# Patient Record
Sex: Male | Born: 1945 | Race: White | Hispanic: No | Marital: Married
Health system: Southern US, Community
[De-identification: ages and names within clinical notes are randomized; demographics above are authoritative.]

---

## 2011-05-30 DIAGNOSIS — N183 Chronic kidney disease, stage 3 unspecified: Secondary | ICD-10-CM | POA: Diagnosis not present

## 2011-05-30 DIAGNOSIS — I129 Hypertensive chronic kidney disease with stage 1 through stage 4 chronic kidney disease, or unspecified chronic kidney disease: Secondary | ICD-10-CM | POA: Diagnosis not present

## 2011-08-17 DIAGNOSIS — I2510 Atherosclerotic heart disease of native coronary artery without angina pectoris: Secondary | ICD-10-CM | POA: Diagnosis not present

## 2011-08-17 DIAGNOSIS — E7800 Pure hypercholesterolemia, unspecified: Secondary | ICD-10-CM | POA: Diagnosis not present

## 2011-08-17 DIAGNOSIS — E559 Vitamin D deficiency, unspecified: Secondary | ICD-10-CM | POA: Diagnosis not present

## 2011-08-17 DIAGNOSIS — Z79899 Other long term (current) drug therapy: Secondary | ICD-10-CM | POA: Diagnosis not present

## 2011-08-17 DIAGNOSIS — E785 Hyperlipidemia, unspecified: Secondary | ICD-10-CM | POA: Diagnosis not present

## 2011-08-17 DIAGNOSIS — I10 Essential (primary) hypertension: Secondary | ICD-10-CM | POA: Diagnosis not present

## 2011-10-01 DIAGNOSIS — I10 Essential (primary) hypertension: Secondary | ICD-10-CM | POA: Diagnosis not present

## 2011-10-01 DIAGNOSIS — E7800 Pure hypercholesterolemia, unspecified: Secondary | ICD-10-CM | POA: Diagnosis not present

## 2011-10-01 DIAGNOSIS — Z8673 Personal history of transient ischemic attack (TIA), and cerebral infarction without residual deficits: Secondary | ICD-10-CM | POA: Diagnosis not present

## 2011-10-01 DIAGNOSIS — R42 Dizziness and giddiness: Secondary | ICD-10-CM | POA: Diagnosis not present

## 2011-10-01 DIAGNOSIS — Z79899 Other long term (current) drug therapy: Secondary | ICD-10-CM | POA: Diagnosis not present

## 2011-10-01 DIAGNOSIS — Z7902 Long term (current) use of antithrombotics/antiplatelets: Secondary | ICD-10-CM | POA: Diagnosis not present

## 2011-10-01 DIAGNOSIS — H8309 Labyrinthitis, unspecified ear: Secondary | ICD-10-CM | POA: Diagnosis not present

## 2011-10-01 DIAGNOSIS — H832X9 Labyrinthine dysfunction, unspecified ear: Secondary | ICD-10-CM | POA: Diagnosis not present

## 2011-10-05 DIAGNOSIS — J9819 Other pulmonary collapse: Secondary | ICD-10-CM | POA: Diagnosis not present

## 2011-10-05 DIAGNOSIS — E876 Hypokalemia: Secondary | ICD-10-CM | POA: Diagnosis present

## 2011-10-05 DIAGNOSIS — I6789 Other cerebrovascular disease: Secondary | ICD-10-CM | POA: Diagnosis not present

## 2011-10-05 DIAGNOSIS — R5381 Other malaise: Secondary | ICD-10-CM | POA: Diagnosis not present

## 2011-10-05 DIAGNOSIS — R42 Dizziness and giddiness: Secondary | ICD-10-CM | POA: Diagnosis not present

## 2011-10-05 DIAGNOSIS — R9431 Abnormal electrocardiogram [ECG] [EKG]: Secondary | ICD-10-CM | POA: Diagnosis not present

## 2011-10-05 DIAGNOSIS — R471 Dysarthria and anarthria: Secondary | ICD-10-CM | POA: Diagnosis present

## 2011-10-05 DIAGNOSIS — R4701 Aphasia: Secondary | ICD-10-CM | POA: Diagnosis present

## 2011-10-05 DIAGNOSIS — R1310 Dysphagia, unspecified: Secondary | ICD-10-CM | POA: Diagnosis not present

## 2011-10-05 DIAGNOSIS — R0902 Hypoxemia: Secondary | ICD-10-CM | POA: Diagnosis not present

## 2011-10-05 DIAGNOSIS — J811 Chronic pulmonary edema: Secondary | ICD-10-CM | POA: Diagnosis not present

## 2011-10-05 DIAGNOSIS — R5383 Other fatigue: Secondary | ICD-10-CM | POA: Diagnosis not present

## 2011-10-05 DIAGNOSIS — D638 Anemia in other chronic diseases classified elsewhere: Secondary | ICD-10-CM | POA: Diagnosis present

## 2011-10-05 DIAGNOSIS — I10 Essential (primary) hypertension: Secondary | ICD-10-CM | POA: Diagnosis present

## 2011-10-05 DIAGNOSIS — G835 Locked-in state: Secondary | ICD-10-CM | POA: Diagnosis present

## 2011-10-05 DIAGNOSIS — J90 Pleural effusion, not elsewhere classified: Secondary | ICD-10-CM | POA: Diagnosis not present

## 2011-10-05 DIAGNOSIS — G319 Degenerative disease of nervous system, unspecified: Secondary | ICD-10-CM | POA: Diagnosis not present

## 2011-10-05 DIAGNOSIS — J189 Pneumonia, unspecified organism: Secondary | ICD-10-CM | POA: Diagnosis not present

## 2011-10-05 DIAGNOSIS — Z8673 Personal history of transient ischemic attack (TIA), and cerebral infarction without residual deficits: Secondary | ICD-10-CM | POA: Diagnosis not present

## 2011-10-05 DIAGNOSIS — Z4682 Encounter for fitting and adjustment of non-vascular catheter: Secondary | ICD-10-CM | POA: Diagnosis not present

## 2011-10-05 DIAGNOSIS — J9600 Acute respiratory failure, unspecified whether with hypoxia or hypercapnia: Secondary | ICD-10-CM | POA: Diagnosis not present

## 2011-10-05 DIAGNOSIS — R0602 Shortness of breath: Secondary | ICD-10-CM | POA: Diagnosis not present

## 2011-10-05 DIAGNOSIS — J690 Pneumonitis due to inhalation of food and vomit: Secondary | ICD-10-CM | POA: Diagnosis not present

## 2011-10-05 DIAGNOSIS — E440 Moderate protein-calorie malnutrition: Secondary | ICD-10-CM | POA: Diagnosis not present

## 2011-10-05 DIAGNOSIS — E7800 Pure hypercholesterolemia, unspecified: Secondary | ICD-10-CM | POA: Diagnosis present

## 2011-10-05 DIAGNOSIS — N179 Acute kidney failure, unspecified: Secondary | ICD-10-CM | POA: Diagnosis present

## 2011-10-05 DIAGNOSIS — I6350 Cerebral infarction due to unspecified occlusion or stenosis of unspecified cerebral artery: Secondary | ICD-10-CM | POA: Diagnosis not present

## 2011-10-05 DIAGNOSIS — R918 Other nonspecific abnormal finding of lung field: Secondary | ICD-10-CM | POA: Diagnosis not present

## 2011-10-05 DIAGNOSIS — A419 Sepsis, unspecified organism: Secondary | ICD-10-CM | POA: Diagnosis not present

## 2011-10-05 DIAGNOSIS — Z452 Encounter for adjustment and management of vascular access device: Secondary | ICD-10-CM | POA: Diagnosis not present

## 2011-10-21 ENCOUNTER — Other Ambulatory Visit (HOSPITAL_COMMUNITY): Payer: Self-pay

## 2011-10-21 ENCOUNTER — Inpatient Hospital Stay
Admission: AD | Admit: 2011-10-21 | Discharge: 2011-11-18 | Disposition: A | Discharge status: Skilled Nursing Facility | Payer: Self-pay | Source: Ambulatory Visit | Attending: Internal Medicine | Admitting: Internal Medicine

## 2011-10-21 DIAGNOSIS — N390 Urinary tract infection, site not specified: Secondary | ICD-10-CM | POA: Diagnosis not present

## 2011-10-21 DIAGNOSIS — Z9911 Dependence on respirator [ventilator] status: Secondary | ICD-10-CM | POA: Diagnosis not present

## 2011-10-21 DIAGNOSIS — J690 Pneumonitis due to inhalation of food and vomit: Secondary | ICD-10-CM | POA: Diagnosis present

## 2011-10-21 DIAGNOSIS — J9610 Chronic respiratory failure, unspecified whether with hypoxia or hypercapnia: Secondary | ICD-10-CM | POA: Diagnosis not present

## 2011-10-21 DIAGNOSIS — J9600 Acute respiratory failure, unspecified whether with hypoxia or hypercapnia: Secondary | ICD-10-CM | POA: Diagnosis not present

## 2011-10-21 DIAGNOSIS — R5381 Other malaise: Secondary | ICD-10-CM | POA: Diagnosis present

## 2011-10-21 DIAGNOSIS — R05 Cough: Secondary | ICD-10-CM | POA: Diagnosis not present

## 2011-10-21 DIAGNOSIS — R059 Cough, unspecified: Secondary | ICD-10-CM | POA: Diagnosis not present

## 2011-10-21 DIAGNOSIS — E785 Hyperlipidemia, unspecified: Secondary | ICD-10-CM | POA: Diagnosis present

## 2011-10-21 DIAGNOSIS — F329 Major depressive disorder, single episode, unspecified: Secondary | ICD-10-CM | POA: Diagnosis present

## 2011-10-21 DIAGNOSIS — J9620 Acute and chronic respiratory failure, unspecified whether with hypoxia or hypercapnia: Secondary | ICD-10-CM | POA: Diagnosis not present

## 2011-10-21 DIAGNOSIS — D649 Anemia, unspecified: Secondary | ICD-10-CM | POA: Diagnosis present

## 2011-10-21 DIAGNOSIS — B9689 Other specified bacterial agents as the cause of diseases classified elsewhere: Secondary | ICD-10-CM | POA: Diagnosis present

## 2011-10-21 DIAGNOSIS — Z930 Tracheostomy status: Secondary | ICD-10-CM | POA: Diagnosis not present

## 2011-10-21 DIAGNOSIS — I69969 Other paralytic syndrome following unspecified cerebrovascular disease affecting unspecified side: Secondary | ICD-10-CM | POA: Diagnosis not present

## 2011-10-21 DIAGNOSIS — G894 Chronic pain syndrome: Secondary | ICD-10-CM | POA: Diagnosis present

## 2011-10-21 DIAGNOSIS — I10 Essential (primary) hypertension: Secondary | ICD-10-CM | POA: Diagnosis present

## 2011-10-21 DIAGNOSIS — J9819 Other pulmonary collapse: Secondary | ICD-10-CM | POA: Diagnosis not present

## 2011-10-21 DIAGNOSIS — R0602 Shortness of breath: Secondary | ICD-10-CM | POA: Diagnosis not present

## 2011-10-21 DIAGNOSIS — E876 Hypokalemia: Secondary | ICD-10-CM | POA: Diagnosis not present

## 2011-10-21 DIAGNOSIS — G835 Locked-in state: Secondary | ICD-10-CM | POA: Diagnosis present

## 2011-10-21 DIAGNOSIS — E46 Unspecified protein-calorie malnutrition: Secondary | ICD-10-CM | POA: Diagnosis present

## 2011-10-21 DIAGNOSIS — R1310 Dysphagia, unspecified: Secondary | ICD-10-CM | POA: Diagnosis present

## 2011-10-21 DIAGNOSIS — B370 Candidal stomatitis: Secondary | ICD-10-CM | POA: Diagnosis present

## 2011-10-21 DIAGNOSIS — J90 Pleural effusion, not elsewhere classified: Secondary | ICD-10-CM | POA: Diagnosis not present

## 2011-10-21 DIAGNOSIS — M6281 Muscle weakness (generalized): Secondary | ICD-10-CM | POA: Diagnosis not present

## 2011-10-21 DIAGNOSIS — R918 Other nonspecific abnormal finding of lung field: Secondary | ICD-10-CM | POA: Diagnosis not present

## 2011-10-21 DIAGNOSIS — J95851 Ventilator associated pneumonia: Secondary | ICD-10-CM | POA: Diagnosis present

## 2011-10-21 DIAGNOSIS — I6350 Cerebral infarction due to unspecified occlusion or stenosis of unspecified cerebral artery: Secondary | ICD-10-CM | POA: Diagnosis not present

## 2011-10-21 DIAGNOSIS — R079 Chest pain, unspecified: Secondary | ICD-10-CM | POA: Diagnosis not present

## 2011-10-21 DIAGNOSIS — R0989 Other specified symptoms and signs involving the circulatory and respiratory systems: Secondary | ICD-10-CM | POA: Diagnosis not present

## 2011-10-21 DIAGNOSIS — G8250 Quadriplegia, unspecified: Secondary | ICD-10-CM | POA: Diagnosis not present

## 2011-10-21 DIAGNOSIS — J811 Chronic pulmonary edema: Secondary | ICD-10-CM | POA: Diagnosis not present

## 2011-10-21 DIAGNOSIS — G9340 Encephalopathy, unspecified: Secondary | ICD-10-CM | POA: Diagnosis present

## 2011-10-21 DIAGNOSIS — J159 Unspecified bacterial pneumonia: Secondary | ICD-10-CM | POA: Diagnosis not present

## 2011-10-21 DIAGNOSIS — R1312 Dysphagia, oropharyngeal phase: Secondary | ICD-10-CM | POA: Diagnosis not present

## 2011-10-21 DIAGNOSIS — R42 Dizziness and giddiness: Secondary | ICD-10-CM | POA: Diagnosis not present

## 2011-10-21 DIAGNOSIS — Z431 Encounter for attention to gastrostomy: Secondary | ICD-10-CM | POA: Diagnosis not present

## 2011-10-21 DIAGNOSIS — J984 Other disorders of lung: Secondary | ICD-10-CM | POA: Diagnosis not present

## 2011-10-21 DIAGNOSIS — I69922 Dysarthria following unspecified cerebrovascular disease: Secondary | ICD-10-CM | POA: Diagnosis not present

## 2011-10-21 DIAGNOSIS — R000 Tachycardia, unspecified: Secondary | ICD-10-CM | POA: Diagnosis not present

## 2011-10-21 DIAGNOSIS — M6250 Muscle wasting and atrophy, not elsewhere classified, unspecified site: Secondary | ICD-10-CM | POA: Diagnosis not present

## 2011-10-22 ENCOUNTER — Other Ambulatory Visit (HOSPITAL_COMMUNITY): Payer: Self-pay

## 2011-10-22 DIAGNOSIS — J9620 Acute and chronic respiratory failure, unspecified whether with hypoxia or hypercapnia: Secondary | ICD-10-CM | POA: Diagnosis present

## 2011-10-22 DIAGNOSIS — Z9911 Dependence on respirator [ventilator] status: Secondary | ICD-10-CM | POA: Diagnosis not present

## 2011-10-22 DIAGNOSIS — J9600 Acute respiratory failure, unspecified whether with hypoxia or hypercapnia: Secondary | ICD-10-CM | POA: Diagnosis not present

## 2011-10-22 DIAGNOSIS — R918 Other nonspecific abnormal finding of lung field: Secondary | ICD-10-CM | POA: Diagnosis not present

## 2011-10-22 DIAGNOSIS — Z930 Tracheostomy status: Secondary | ICD-10-CM

## 2011-10-22 LAB — CBC WITH DIFFERENTIAL/PLATELET
Basophils Absolute: 0.1 10*3/uL (ref 0.0–0.1)
Basophils Relative: 1 % (ref 0–1)
Eosinophils Absolute: 0.4 10*3/uL (ref 0.0–0.7)
Eosinophils Relative: 4 % (ref 0–5)
HCT: 30.7 % — ABNORMAL LOW (ref 39.0–52.0)
Hemoglobin: 10.1 g/dL — ABNORMAL LOW (ref 13.0–17.0)
Lymphocytes Relative: 10 % — ABNORMAL LOW (ref 12–46)
Lymphs Abs: 1.1 10*3/uL (ref 0.7–4.0)
MCH: 30.9 pg (ref 26.0–34.0)
MCHC: 32.9 g/dL (ref 30.0–36.0)
MCV: 93.9 fL (ref 78.0–100.0)
Monocytes Absolute: 0.7 10*3/uL (ref 0.1–1.0)
Monocytes Relative: 6 % (ref 3–12)
Neutro Abs: 8.9 10*3/uL — ABNORMAL HIGH (ref 1.7–7.7)
Neutrophils Relative %: 80 % — ABNORMAL HIGH (ref 43–77)
Platelets: 511 10*3/uL — ABNORMAL HIGH (ref 150–400)
RBC: 3.27 MIL/uL — ABNORMAL LOW (ref 4.22–5.81)
RDW: 13.5 % (ref 11.5–15.5)
WBC: 11.2 10*3/uL — ABNORMAL HIGH (ref 4.0–10.5)

## 2011-10-22 LAB — PHOSPHORUS: Phosphorus: 2.2 mg/dL — ABNORMAL LOW (ref 2.3–4.6)

## 2011-10-22 LAB — VANCOMYCIN, TROUGH: Vancomycin Tr: 9.6 ug/mL — ABNORMAL LOW (ref 10.0–20.0)

## 2011-10-22 LAB — COMPREHENSIVE METABOLIC PANEL
ALT: 20 U/L (ref 0–53)
AST: 14 U/L (ref 0–37)
Albumin: 2.4 g/dL — ABNORMAL LOW (ref 3.5–5.2)
Alkaline Phosphatase: 70 U/L (ref 39–117)
BUN: 22 mg/dL (ref 6–23)
CO2: 25 mEq/L (ref 19–32)
Calcium: 8.7 mg/dL (ref 8.4–10.5)
Chloride: 107 mEq/L (ref 96–112)
Creatinine, Ser: 0.9 mg/dL (ref 0.50–1.35)
GFR calc Af Amer: >90 mL/min (ref >90)
GFR calc non Af Amer: 87 mL/min — ABNORMAL LOW (ref >90)
Glucose, Bld: 161 mg/dL — ABNORMAL HIGH (ref 70–99)
Potassium: 3.3 mEq/L — ABNORMAL LOW (ref 3.5–5.1)
Sodium: 142 mEq/L (ref 135–145)
Total Bilirubin: 0.5 mg/dL (ref 0.3–1.2)
Total Protein: 6.2 g/dL (ref 6.0–8.3)

## 2011-10-22 LAB — IRON AND TIBC
Iron: 18 ug/dL — ABNORMAL LOW (ref 42–135)
Saturation Ratios: 9 % — ABNORMAL LOW (ref 20–55)
TIBC: 194 ug/dL — ABNORMAL LOW (ref 215–435)
UIBC: 176 ug/dL (ref 125–400)

## 2011-10-22 LAB — MAGNESIUM: Magnesium: 1.8 mg/dL (ref 1.5–2.5)

## 2011-10-22 LAB — VITAMIN B12: Vitamin B-12: 372 pg/mL (ref 211–911)

## 2011-10-22 LAB — TSH: TSH: 0.737 u[IU]/mL (ref 0.350–4.500)

## 2011-10-22 LAB — PROCALCITONIN: Procalcitonin: <0.1 ng/mL

## 2011-10-22 NOTE — Consult Note (Signed)
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 [facility] 10/22/11 for help with acute on chronic respiratory failure and chronic critical illness  PMH: 1. Stroke 2. HTN 3. ARF resolved 4. Aspiration pna 5.  meds reviewed Allergies none  Family History reviewed Social History Former smoker Review Of Systems:  na  Vital Signs: Vital signs reviewed. Abnormal values will appear under impression plan section.    Physical Examination: General:  WNWDWM Neuro:  Global weakness with L.R HEENT:  No adenopathy Neck:  Trach->vent Cardiovascular:  hsr rrr Lungs:  ciarse rhonchi Abdomen:  +bs and tube feeds via peg Musculoskeletal:  weak Skin:  intact   ASSESSMENT AND PLAN  PULMONARY No results found for this basename: PHART:5,PCO2:5,PCO2ART:5,PO2ART:5,HCO3:5,O2SAT:5 in the last 168 hours Ventilator Settings:   CXR:   Dg Chest Port 1 View  10/22/2011  *RADIOLOGY REPORT*  Clinical Data: VDRF  PORTABLE CHEST - 1 VIEW  Comparison: None.  Findings: Mild bilateral lower lobe opacities, possibly atelectasis.  Chronic interstitial markings/emphysematous changes. No pleural effusion or pneumothorax.  Mild cardiomegaly.  Tracheostomy.  Left IJ venous catheter with its tip just below the cavoatrial junction.  IMPRESSION: Mild bilateral lower lobe opacities, possibly atelectasis.  Original Report Authenticated By: Charline Bills, M.D.   Dg Abd Portable 1v  10/21/2011  *RADIOLOGY REPORT*  Clinical Data: Peg tube placement  PORTABLE ABDOMEN - 1 VIEW  Comparison:  Portable exam 2035 hours without priors for comparison  Findings: . Gastrostomy tube projects over the expected position of the mid stomach. Normal bowel gas pattern. Osseous structures unremarkable. Vascular calcifications in pelvis. No urinary tract calcifications.  IMPRESSION: Gastrostomy tube projects over the expected position of the mid stomach.  Original Report Authenticated By: Lollie Marrow, M.D.    Lab 10/22/11 0513  NA 142  K 3.3*  CL 107  CO2 25  BUN 22  CREATININE 0.90  GLUCOSE 161*    Lab 10/22/11 0513  HGB 10.1*  HCT 30.7*  WBC 11.2*  PLT 511*    ABG   trach:  09/28/24  A:  Acute on Chronic Respiratory Failure that is multifactorial with aspiration pna, medullary stroke and decondtioning. P:   -6/29 start [facility] wean protocol -abx per ssh hospitalist -stroke per [facility] Minor ACNP Adolph Pollack PCCM Pager [phone] till 3 pm If no answer page [phone] 10/22/2011, 10:58 AM    STAFF NOTE: I, Dr Lavinia Sharps have personally reviewed patient's available data, including medical history, events of note, physical examination and test results as part of my evaluation. I have discussed with resident/NP and other care providers such as pharmacist, RN and RRT.  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 I updated son about general nature of weaning process and chronic critical illness and uncertainties. He is appreciative of care. Will start SBT protocol   Rest per NP/medical resident whose note is outlined above and that I agree with  Dr. Kalman Shan, M.D., [facility].C.P Pulmonary and Critical Care Medicine Staff Physician [facility] [place] [facility] Critical [facility] Pager: [phone], If no answer or between  15:00h - 7:00h: call [phone]  10/22/2011 1:18 PM

## 2011-10-23 LAB — POTASSIUM: Potassium: 3.9 mEq/L (ref 3.5–5.1)

## 2011-10-23 LAB — HEMOGLOBIN A1C
Hgb A1c MFr Bld: 6.5 % — ABNORMAL HIGH (ref <5.7)
Mean Plasma Glucose: 140 mg/dL — ABNORMAL HIGH (ref <117)

## 2011-10-24 ENCOUNTER — Other Ambulatory Visit (HOSPITAL_COMMUNITY): Payer: Self-pay

## 2011-10-24 DIAGNOSIS — Z930 Tracheostomy status: Secondary | ICD-10-CM

## 2011-10-24 DIAGNOSIS — J9620 Acute and chronic respiratory failure, unspecified whether with hypoxia or hypercapnia: Secondary | ICD-10-CM

## 2011-10-24 DIAGNOSIS — J9819 Other pulmonary collapse: Secondary | ICD-10-CM | POA: Diagnosis not present

## 2011-10-24 DIAGNOSIS — R0602 Shortness of breath: Secondary | ICD-10-CM | POA: Diagnosis not present

## 2011-10-24 DIAGNOSIS — I6350 Cerebral infarction due to unspecified occlusion or stenosis of unspecified cerebral artery: Secondary | ICD-10-CM | POA: Diagnosis not present

## 2011-10-24 LAB — PREALBUMIN: Prealbumin: 16.4 mg/dL — ABNORMAL LOW (ref 17.0–34.0)

## 2011-10-24 LAB — CBC
HCT: 31.9 % — ABNORMAL LOW (ref 39.0–52.0)
Hemoglobin: 10.6 g/dL — ABNORMAL LOW (ref 13.0–17.0)
MCH: 31.1 pg (ref 26.0–34.0)
MCHC: 33.2 g/dL (ref 30.0–36.0)
MCV: 93.5 fL (ref 78.0–100.0)
Platelets: 536 10*3/uL — ABNORMAL HIGH (ref 150–400)
RBC: 3.41 MIL/uL — ABNORMAL LOW (ref 4.22–5.81)
RDW: 13.4 % (ref 11.5–15.5)
WBC: 9.3 10*3/uL (ref 4.0–10.5)

## 2011-10-24 LAB — CULTURE, RESPIRATORY W GRAM STAIN

## 2011-10-24 LAB — BASIC METABOLIC PANEL
BUN: 19 mg/dL (ref 6–23)
CO2: 26 mEq/L (ref 19–32)
Calcium: 9.3 mg/dL (ref 8.4–10.5)
Chloride: 102 mEq/L (ref 96–112)
Creatinine, Ser: 0.88 mg/dL (ref 0.50–1.35)
GFR calc Af Amer: >90 mL/min (ref >90)
GFR calc non Af Amer: 88 mL/min — ABNORMAL LOW (ref >90)
Glucose, Bld: 147 mg/dL — ABNORMAL HIGH (ref 70–99)
Potassium: 3.9 mEq/L (ref 3.5–5.1)
Sodium: 137 mEq/L (ref 135–145)

## 2011-10-24 LAB — FOLATE RBC: RBC Folate: 1030 ng/mL — ABNORMAL HIGH (ref >=366)

## 2011-10-24 LAB — GRAM STAIN

## 2011-10-24 LAB — VANCOMYCIN, TROUGH: Vancomycin Tr: 13.3 ug/mL (ref 10.0–20.0)

## 2011-10-24 LAB — MAGNESIUM: Magnesium: 1.8 mg/dL (ref 1.5–2.5)

## 2011-10-24 LAB — C-REACTIVE PROTEIN: CRP: 9.95 mg/dL — ABNORMAL HIGH (ref <0.60)

## 2011-10-24 NOTE — Progress Notes (Signed)
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 [facility] 10/22/11 for help with acute on chronic respiratory failure and chronic critical illness  EVENTS   SUBJECTIVE/OVERNIGHT/INTERVAL HX  - Some power on all extremities. Follows commands. Oriented.   - Unable to wwean; gets very tachypneic.  - Intermittent bloody secretions via trach  Vital Signs: T 98.8 P 88 RR 16 BP 188/98.    Physical Examination: General:  WNWDWM Neuro:  some power on all extremities (1-2/5). Follows commands. Oriented.  HEENT:  No adenopathy Neck:  Trach->vent Cardiovascular:  hsr rrr Lungs:  ciarse rhonchi Abdomen:  +bs and tube feeds via peg Musculoskeletal:  weak Skin:  intact   Dg Chest Port 1 View  10/24/2011  *RADIOLOGY REPORT*  Clinical Data:  Tracheostomy, stroke, shortness of breath, aspiration  PORTABLE CHEST - 1 VIEW  Comparison: Portable exam 0846 hours compared to 10/22/2011  Findings: Tracheostomy tube stable. Tip of left subclavian line projects over high right atrium though this could be accentuated by positioning. Enlargement of cardiac silhouette. Tortuous aorta. Bibasilar atelectasis. Upper lungs clear. No pleural effusion or pneumothorax.  IMPRESSION: Bibasilar atelectasis.  Original Report Authenticated By: Lollie Marrow, M.D.     Lab 10/24/11 0400 10/22/11 0513  HGB 10.6* 10.1*  HCT 31.9* 30.7*  WBC 9.3 11.2*  PLT 536* 511*    Lab 10/24/11 0400 10/23/11 0448 10/22/11 0513  NA 137 -- 142  K 3.9 3.9 --  CL 102  -- 107  CO2 26 -- 25  GLUCOSE 147* -- 161*  BUN 19 -- 22  CREATININE 0.88 -- 0.90  CALCIUM 9.3 -- 8.7  MG 1.8 -- 1.8  PHOS -- -- 2.2*    No results found for this basename: PROBNP:5 in the last 168 hours  No results found for this basename: TROPONINI:5 in the last 168 hours   ASSESSMENT AND PLAN  PULMONARY    A:  Acute on Chronic Respiratory Failure that is multifactorial with aspiration pna, medullary stroke and decondtioning. S/p Trach at [facility] 10/19/11  - on 10/24/11: Fails SBT with tachypnea; sugestive of neuromuscular weakness esp in setting of clear lung fields. Has interittent bloody secretions  P:   -SBT as tolerated - check tracheal aspirate for culture and check PCT (afebrile though)  - check bnp ( noted he is on coreg, ace inhibitor, plavix), and depending on result decide on echo     Dr. Kalman Shan, M.D., [facility].C.P Pulmonary and Critical Care Medicine Staff Physician [facility] [place] Pulmonary and Critical Care Pager: [phone], If no answer or between  15:00h - 7:00h: call [phone]  10/24/2011 11:34 AM

## 2011-10-25 LAB — PRO B NATRIURETIC PEPTIDE: Pro B Natriuretic peptide (BNP): 324.7 pg/mL — ABNORMAL HIGH (ref 0–125)

## 2011-10-25 LAB — BASIC METABOLIC PANEL
BUN: 21 mg/dL (ref 6–23)
CO2: 27 mEq/L (ref 19–32)
Calcium: 9.4 mg/dL (ref 8.4–10.5)
Chloride: 100 mEq/L (ref 96–112)
Creatinine, Ser: 0.97 mg/dL (ref 0.50–1.35)
GFR calc Af Amer: >90 mL/min (ref >90)
GFR calc non Af Amer: 84 mL/min — ABNORMAL LOW (ref >90)
Glucose, Bld: 139 mg/dL — ABNORMAL HIGH (ref 70–99)
Potassium: 3.7 mEq/L (ref 3.5–5.1)
Sodium: 135 mEq/L (ref 135–145)

## 2011-10-25 LAB — PROCALCITONIN: Procalcitonin: <0.1 ng/mL

## 2011-10-26 NOTE — Progress Notes (Signed)
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 [facility]CC[facility] 10/22/11 for help with acute on chronic respiratory failure and chronic critical illness  EVENTS 10/24/11 - not weaning  SUBJECTIVE/OVERNIGHT/INTERVAL HX 7/3: weaned 8h yesterdady on PSV.  Some power on all extremities. Follows commands. Oriented.  No secretions  -   Vital Signs: T 99.6 P 84 RR 16 BP 160/85.    Physical Examination: General:  Chronic critically lill. Looks better than 10/24/11 Neuro:  some power on all extremities (1-2/5). Follows commands. Oriented.  HEENT:  No adenopathy Neck:  Trach->vent Cardiovascular:  hsr rrr Lungs:  ciarse rhonchi Abdomen:  +bs and tube feeds via peg Musculoskeletal:  weak Skin:  intact   No results found.   Lab 10/24/11 0400 10/22/11 0513  HGB 10.6* 10.1*  HCT 31.9* 30.7*  WBC 9.3 11.2*  PLT 536* 511*    Lab 10/25/11 0820 10/24/11 0400 10/22/11 0513  NA 135 137 142  K 3.7 3.9 --  CL 100 102 107  CO2 27 26 25   GLUCOSE 139* 147* 161*  BUN 21 19 22   CREATININE 0.97 0.88 0.90  CALCIUM 9.4 9.3 8.7  MG -- 1.8 1.8  PHOS -- -- 2.2*     Lab 10/25/11 0440  PROBNP 324.7*    No results found for this basename: TROPONINI:5 in the last 168 hours   Lab 10/25/11 0440 10/22/11 0513  PROCALCITON <0.10 <0.10    Results for orders placed during the hospital encounter of 10/21/11  CULTURE, RESPIRATORY     Status: Normal   Collection Time   10/22/11  5:00 AM      Component Value Range Status Comment   Specimen Description TRACHEAL ASPIRATE   Final    Special Requests NONE   Final    Gram Stain     Final    Value: FEW WBC PRESENT, PREDOMINANTLY PMN     NO SQUAMOUS EPITHELIAL CELLS SEEN     FEW GRAM NEGATIVE RODS   Culture FEW STENOTROPHOMONAS MALTOPHILIA   Final    Report Status 10/24/2011 FINAL   Final    Organism ID, Bacteria STENOTROPHOMONAS MALTOPHILIA   Final   GRAM STAIN     Status: Normal   Collection Time   10/24/11  6:46 PM      Component Value Range Status Comment   Specimen Description TRACHEAL ASPIRATE   Final    Special Requests NONE   Final    Gram Stain     Final    Value: ABUNDANT WBC PRESENT,BOTH PMN AND MONONUCLEAR     RARE GRAM POSITIVE COCCI     RARE GRAM NEGATIVE RODS   Report Status 10/24/2011 FINAL   Final   CULTURE, RESPIRATORY     Status: Normal (Preliminary result)   Collection Time   10/24/11  6:47 PM      Component Value Range Status Comment   Specimen Description TRACHEAL ASPIRATE   Final    Special Requests NONE   Final  Gram Stain PENDING   Incomplete    Culture MODERATE GRAM NEGATIVE RODS   Final    Report Status PENDING   Incomplete      ASSESSMENT AND PLAN  PULMONARY    A:  Acute on Chronic Respiratory Failure that is multifactorial with aspiration pna, medullary stroke and decondtioning. S/p Trach at [facility] 10/19/11  - on 10/26/11: doing sbt on 10/25/11 for 8h. BNP 300s. PCT normal and no evidence of sepsis but does have stenotrophomonas in sputum  P:   -SBT as tolerated - abx per [facility] MD; change vanxc and zosyn to levaquin 5 day course from 10/26/2011  - get echo   D/w Dr Christella Hartigan   Dr. Kalman Shan, M.D., [facility].C.P Pulmonary and Critical Care Medicine Staff Physician [facility] [place] Pulmonary and Critical Care Pager: [phone], If no answer or between  15:00h - 7:00h: call [phone]  10/26/2011 12:00 PM

## 2011-10-28 LAB — CBC
HCT: 35.5 % — ABNORMAL LOW (ref 39.0–52.0)
Hemoglobin: 11.4 g/dL — ABNORMAL LOW (ref 13.0–17.0)
MCH: 29.5 pg (ref 26.0–34.0)
MCHC: 32.1 g/dL (ref 30.0–36.0)
MCV: 92 fL (ref 78.0–100.0)
Platelets: 523 10*3/uL — ABNORMAL HIGH (ref 150–400)
RBC: 3.86 MIL/uL — ABNORMAL LOW (ref 4.22–5.81)
RDW: 12.8 % (ref 11.5–15.5)
WBC: 9.3 10*3/uL (ref 4.0–10.5)

## 2011-10-28 LAB — BASIC METABOLIC PANEL
BUN: 28 mg/dL — ABNORMAL HIGH (ref 6–23)
CO2: 26 mEq/L (ref 19–32)
Calcium: 9.9 mg/dL (ref 8.4–10.5)
Chloride: 98 mEq/L (ref 96–112)
Creatinine, Ser: 0.95 mg/dL (ref 0.50–1.35)
GFR calc Af Amer: >90 mL/min (ref >90)
GFR calc non Af Amer: 85 mL/min — ABNORMAL LOW (ref >90)
Glucose, Bld: 136 mg/dL — ABNORMAL HIGH (ref 70–99)
Potassium: 3.5 mEq/L (ref 3.5–5.1)
Sodium: 137 mEq/L (ref 135–145)

## 2011-10-28 NOTE — Progress Notes (Signed)
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 [facility] 10/22/11 for help with acute on chronic respiratory failure and chronic critical illness  EVENTS 7/1 - not weaning 7/3/ PCT normal and no evidence of sepsis / resp culture pos for stenotrophomonas in sputum 7/4 - 16 hours PSV 7/5 - 1 st day on  ATC  SUBJECTIVE/OVERNIGHT/INTERVAL HX Wife reports pt up to chair this am.  Tolerated PSV 16 hours on 7/4.   Denies pain  Vital Signs: Reviewed.   Physical Examination: General:  Chronically ill in NAD on Vent Neuro:  some power on all extremities (1-2/5). Follows commands. Oriented.  HEENT:  No adenopathy Neck:  #8 Trach c/d/i Cardiovascular: s1s2 rrr, no m/r/g Lungs:  coarse rhonchi, even/ non-labored Abdomen:  +bs and tube feeds via peg Musculoskeletal:  weak Skin:  intact   Lab 10/28/11 0700 10/24/11 0400 10/22/11 0513  HGB 11.4* 10.6* 10.1*  HCT 35.5* 31.9* 30.7*  WBC 9.3 9.3 11.2*  PLT 523* 536* 511*    Lab 10/28/11 0700 10/25/11 0820 10/24/11 0400 10/22/11 0513  NA 137 135 137 142  K 3.5 3.7 -- --  CL 98 100 102 107  CO2 26 27 26 25   GLUCOSE 136* 139* 147* 161*  BUN 28* 21 19 22   CREATININE 0.95 0.97 0.88 0.90  CALCIUM 9.9 9.4 9.3 8.7  MG -- -- 1.8 1.8  PHOS -- -- -- 2.2*    Lab 10/25/11 0440  PROBNP 324.7*      Lab 10/25/11 0440 10/22/11 0513  PROCALCITON <0.10 <0.10    Results for orders placed during the hospital encounter of 10/21/11  CULTURE, RESPIRATORY     Status: Normal   Collection Time     10/22/11  5:00 AM      Component Value Range Status Comment   Specimen Description TRACHEAL ASPIRATE   Final    Special Requests NONE   Final    Gram Stain     Final    Value: FEW WBC PRESENT, PREDOMINANTLY PMN     NO SQUAMOUS EPITHELIAL CELLS SEEN     FEW GRAM NEGATIVE RODS   Culture FEW STENOTROPHOMONAS MALTOPHILIA   Final    Report Status 10/24/2011 FINAL   Final    Organism ID, Bacteria STENOTROPHOMONAS MALTOPHILIA   Final   GRAM STAIN     Status: Normal   Collection Time   10/24/11  6:46 PM      Component Value Range Status Comment   Specimen Description TRACHEAL ASPIRATE   Final    Special Requests NONE   Final    Gram Stain     Final    Value: ABUNDANT WBC PRESENT,BOTH PMN AND MONONUCLEAR     RARE GRAM POSITIVE COCCI     RARE GRAM NEGATIVE RODS   Report Status 10/24/2011 FINAL   Final   CULTURE, RESPIRATORY     Status: Normal (Preliminary result)   Collection Time   10/24/11  6:47 PM      Component Value Range Status Comment  Specimen Description TRACHEAL ASPIRATE   Final    Special Requests NONE   Final    Gram Stain PENDING   Incomplete    Culture MODERATE STENOTROPHOMONAS MALTOPHILIA   Final    Report Status PENDING   Incomplete    Organism ID, Bacteria STENOTROPHOMONAS MALTOPHILIA   Final      ASSESSMENT AND PLAN  PULMONARY  A:   Acute on Chronic Respiratory Failure -multifactorial with aspiration pna, medullary stroke and decondtioning. S/p Trach at [facility] 10/19/11.  Tracheal aspirate positive for stenotrophomonas in sputum 7/1.  P:   -SBT as tolerated >> tolerating ATc today, secretions may be main issue here -abx per [facility] MD; change vanc and zosyn to levaquin 5 day course from 10/26/2011 -may need longer course to clear stenotrophomonas -wean per protocol -mobilize / aggressive PT -consider downsize trach to #6 cuffed    Canary Brim, NP-C [place] Pulmonary & Critical [facility] Pgr: [phone] or [phone]   Independently examined pt, evaluated data &  formulated above care plan with NP  Cyril Mourning MD. FCCP. [place] Pulmonary & Critical care Pager [phone] If no response call [phone]

## 2011-10-30 LAB — BASIC METABOLIC PANEL
BUN: 39 mg/dL — ABNORMAL HIGH (ref 6–23)
CO2: 27 mEq/L (ref 19–32)
Calcium: 10.2 mg/dL (ref 8.4–10.5)
Chloride: 98 mEq/L (ref 96–112)
Creatinine, Ser: 1.16 mg/dL (ref 0.50–1.35)
GFR calc Af Amer: 74 mL/min — ABNORMAL LOW (ref >90)
GFR calc non Af Amer: 64 mL/min — ABNORMAL LOW (ref >90)
Glucose, Bld: 138 mg/dL — ABNORMAL HIGH (ref 70–99)
Potassium: 3.9 mEq/L (ref 3.5–5.1)
Sodium: 137 mEq/L (ref 135–145)

## 2011-10-31 NOTE — Progress Notes (Signed)
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 [facility] 10/22/11 for help with acute on chronic respiratory failure and chronic critical illness  Culture data 6/29 sputum: stenotrophomonas 7/1: sputum :  stenotrophomonas  abx levaquin (steno) 7/3>>>7/9 (stop date) EVENTS 7/1 - not weaning 7/3/ PCT normal and no evidence of sepsis / resp culture pos for stenotrophomonas in sputum 7/4 - 16 hours PSV 7/5 - 1 st day on  ATC  SUBJECTIVE/OVERNIGHT/INTERVAL HX No distress.   Vital Signs: Reviewed. 100% on 35% ATC  Physical Examination: General:  Chronically ill in NAD on ATC Neuro:  some power on all extremities (1-2/5). Follows commands. Oriented.  HEENT:  No adenopathy Neck:  #8 Trach c/d/i Cardiovascular: s1s2 rrr, no m/r/g Lungs:  coarse rhonchi, even/ non-labored Abdomen:  +bs and tube feeds via peg Musculoskeletal:  weak Skin:  intact   Lab 10/28/11 0700  HGB 11.4*  HCT 35.5*  WBC 9.3  PLT 523*    Lab 10/30/11 0620 10/28/11 0700 10/25/11 0820  NA 137 137 135  K 3.9 3.5 --  CL 98 98 100  CO2 27 26 27   GLUCOSE 138* 136* 139*  BUN 39* 28* 21  CREATININE 1.16 0.95 0.97  CALCIUM 10.2 9.9 9.4  MG -- -- --  PHOS -- -- --    Lab 10/25/11 0440  PROBNP 324.7*      Lab 10/25/11 0440  PROCALCITON <0.10   ASSESSMENT AND PLAN  PULMONARY  A:   Acute on Chronic Respiratory Failure -multifactorial with aspiration pna, medullary stroke and decondtioning. S/p Trach at [facility] 10/19/11.  Tracheal aspirate  positive for stenotrophomonas in sputum 7/1. P:   -continue to work on weaning... Will need to see about decannulation (this will be determined by his strength and ability to protect airway) at this point no immediate plans for this.  -abx per [facility] MD -mobilize / aggressive PT  Patient at 12 hr TC stage.  Once 48 hour off the vent will consider swallow evaluation and further progression.  Patient seen and examined, agree with above note.  I dictated the care and orders written for this patient under my direction.  Koren Bound, M.D. [phone]

## 2011-11-02 LAB — BASIC METABOLIC PANEL
BUN: 41 mg/dL — ABNORMAL HIGH (ref 6–23)
CO2: 27 mEq/L (ref 19–32)
Calcium: 9.9 mg/dL (ref 8.4–10.5)
Chloride: 96 mEq/L (ref 96–112)
Creatinine, Ser: 1.07 mg/dL (ref 0.50–1.35)
GFR calc Af Amer: 82 mL/min — ABNORMAL LOW (ref >90)
GFR calc non Af Amer: 70 mL/min — ABNORMAL LOW (ref >90)
Glucose, Bld: 147 mg/dL — ABNORMAL HIGH (ref 70–99)
Potassium: 3.6 mEq/L (ref 3.5–5.1)
Sodium: 136 mEq/L (ref 135–145)

## 2011-11-02 LAB — CBC
HCT: 35.7 % — ABNORMAL LOW (ref 39.0–52.0)
Hemoglobin: 11.9 g/dL — ABNORMAL LOW (ref 13.0–17.0)
MCH: 30 pg (ref 26.0–34.0)
MCHC: 33.3 g/dL (ref 30.0–36.0)
MCV: 89.9 fL (ref 78.0–100.0)
Platelets: 370 10*3/uL (ref 150–400)
RBC: 3.97 MIL/uL — ABNORMAL LOW (ref 4.22–5.81)
RDW: 12.6 % (ref 11.5–15.5)
WBC: 9.3 10*3/uL (ref 4.0–10.5)

## 2011-11-02 LAB — TROPONIN I
Troponin I: <0.3 ng/mL (ref <0.30)
Troponin I: <0.3 ng/mL (ref <0.30)

## 2011-11-03 LAB — TROPONIN I
Troponin I: <0.3 ng/mL (ref <0.30)
Troponin I: <0.3 ng/mL (ref <0.30)
Troponin I: <0.3 ng/mL (ref <0.30)

## 2011-11-04 NOTE — Progress Notes (Addendum)
Name: Dennis Delgado MRN: [id number] DOB: 09/16/1945    LOS: 14 Date of admit 10/21/2011  5:34 PM    Referring Provider:  [facility] Reason for Referral:  VDRF  PULMONARY / CRITICAL CARE MEDICINE  HPI:  66 y/o wm with hx of stroke without residual sx, presented to [facility] 10-05-11 with medullary infarct and aspiration that necessitated vent/peg/abx with trach 10/19/11 and after 2 weeks there was transferred to [facility] 6/28 for liberation from vent an tx of aspiration pna. [facility] 10/22/11 for help with acute on chronic respiratory failure and chronic critical illness  Culture data 6/29 sputum: stenotrophomonas 7/1: sputum :  stenotrophomonas  abx levaquin (steno) 7/3>>>7/9 (stop date) EVENTS 7/1 - not weaning 7/3/ PCT normal and no evidence of sepsis / resp culture pos for stenotrophomonas in sputum 7/4 - 16 hours PSV 7/5 - 1 st day on  ATC  SUBJECTIVE/OVERNIGHT/INTERVAL HX No distress.   Vital Signs: Reviewed. 100% on 30% % ATC  Physical Examination: General:  Chronically ill in NAD on ATC Neuro:  some power on all extremities (1-2/5). Follows commands. Oriented. Not able to phonate yet w/ trach occluded  HEENT:  No adenopathy Neck:  #8 Trach c/d/i Cardiovascular: s1s2 rrr, no m/r/g Lungs:  coarse rhonchi, even/ non-labored Abdomen:  +bs and tube feeds via peg Musculoskeletal:  weak Skin:  intact   Lab 11/02/11 0500  HGB 11.9*  HCT 35.7*  WBC 9.3  PLT 370    Lab 11/02/11 0500 10/30/11 0620  NA 136 137  K 3.6 3.9  CL 96 98  CO2 27 27  GLUCOSE 147* 138*  BUN 41* 39*  CREATININE 1.07 1.16  CALCIUM 9.9 10.2  MG -- --  PHOS -- --   No results found for this basename: PROBNP:5 in the last 168 hours   No results found for this basename: PROCALCITON:5 in the last 168 hours ASSESSMENT AND PLAN  PULMONARY  A:   Acute on Chronic Respiratory Failure -multifactorial with aspiration pna, medullary stroke and decondtioning. S/p  Trach at [facility] 10/19/11.  Tracheal aspirate positive for stenotrophomonas in sputum 7/1. Looks comfortable off vent P:   -continue to work on weaning... Will need to see about decannulation (this will be determined by his strength and ability to protect airway) at this point no immediate plans for this.  -abx per [facility] MD -mobilize / aggressive PT    BABCOCK,PETE,   Approaching 48 hours off the vent, will need swallow evaluation, PMV and capping to determine ability to decannulate.  Patient seen and examined, agree with above note.  I dictated the care and orders written for this patient under my direction.  Koren Bound, M.D. [phone]

## 2011-11-07 LAB — CBC WITH DIFFERENTIAL/PLATELET
Basophils Absolute: 0 10*3/uL (ref 0.0–0.1)
Basophils Relative: 0 % (ref 0–1)
Eosinophils Absolute: 0.6 10*3/uL (ref 0.0–0.7)
Eosinophils Relative: 6 % — ABNORMAL HIGH (ref 0–5)
HCT: 37.5 % — ABNORMAL LOW (ref 39.0–52.0)
Hemoglobin: 12.2 g/dL — ABNORMAL LOW (ref 13.0–17.0)
Lymphocytes Relative: 14 % (ref 12–46)
Lymphs Abs: 1.5 10*3/uL (ref 0.7–4.0)
MCH: 29.5 pg (ref 26.0–34.0)
MCHC: 32.5 g/dL (ref 30.0–36.0)
MCV: 90.8 fL (ref 78.0–100.0)
Monocytes Absolute: 0.7 10*3/uL (ref 0.1–1.0)
Monocytes Relative: 6 % (ref 3–12)
Neutro Abs: 7.8 10*3/uL — ABNORMAL HIGH (ref 1.7–7.7)
Neutrophils Relative %: 74 % (ref 43–77)
Platelets: 280 10*3/uL (ref 150–400)
RBC: 4.13 MIL/uL — ABNORMAL LOW (ref 4.22–5.81)
RDW: 12.9 % (ref 11.5–15.5)
WBC: 10.6 10*3/uL — ABNORMAL HIGH (ref 4.0–10.5)

## 2011-11-07 LAB — MAGNESIUM: Magnesium: 1.9 mg/dL (ref 1.5–2.5)

## 2011-11-07 LAB — BASIC METABOLIC PANEL
BUN: 41 mg/dL — ABNORMAL HIGH (ref 6–23)
CO2: 29 mEq/L (ref 19–32)
Calcium: 9.8 mg/dL (ref 8.4–10.5)
Chloride: 97 mEq/L (ref 96–112)
Creatinine, Ser: 0.95 mg/dL (ref 0.50–1.35)
GFR calc Af Amer: >90 mL/min (ref >90)
GFR calc non Af Amer: 85 mL/min — ABNORMAL LOW (ref >90)
Glucose, Bld: 140 mg/dL — ABNORMAL HIGH (ref 70–99)
Potassium: 3 mEq/L — ABNORMAL LOW (ref 3.5–5.1)
Sodium: 138 mEq/L (ref 135–145)

## 2011-11-07 LAB — ALBUMIN: Albumin: 2.8 g/dL — ABNORMAL LOW (ref 3.5–5.2)

## 2011-11-07 LAB — PREALBUMIN: Prealbumin: 27.5 mg/dL (ref 17.0–34.0)

## 2011-11-07 LAB — C-REACTIVE PROTEIN: CRP: 2.7 mg/dL — ABNORMAL HIGH (ref <0.60)

## 2011-11-07 NOTE — Progress Notes (Signed)
Name: Dennis Delgado MRN: [id number] DOB: 12/20/1945    LOS: 17 Date of admit 10/21/2011  5:34 PM    Referring Provider:  [facility] Reason for Referral:  VDRF  PULMONARY / CRITICAL CARE MEDICINE  HPI:  66 y/o wm with hx of stroke without residual sx, presented to [facility] 10-05-11 with medullary infarct and aspiration that necessitated vent/peg/abx with trach 10/19/11 and after 2 weeks there was transferred to [facility] 6/28 for liberation from vent an tx of aspiration pna. [facility] 10/22/11 for help with acute on chronic respiratory failure and chronic critical illness  Culture data 6/29 sputum: stenotrophomonas 7/1: sputum :  stenotrophomonas  abx levaquin (steno) 7/3>>>7/9 (stop date) EVENTS 7/1 - not weaning 7/3/ PCT normal and no evidence of sepsis / resp culture pos for stenotrophomonas in sputum 7/4 - 16 hours PSV 7/5 - 1 st day on  ATC 7/15 92 hours on t -collar  Vital Signs: Vital signs reviewed.    Physical Examination: General:  Chronically ill in NAD on ATC Neuro:  some power on all extremities (1-2/5). Follows commands. Oriented. Not able to phonate yet w/ trach occluded  HEENT:  No adenopathy Neck:  #8 Trach c/d/i Cardiovascular: s1s2 rrr, no m/r/g Lungs:  coarse rhonchi, even/ non-labored Abdomen:  +bs and tube feeds via peg Musculoskeletal:  weak Skin:  intact No results found.   Lab 11/07/11 0537 11/02/11 0500  HGB 12.2* 11.9*  HCT 37.5* 35.7*  WBC 10.6* 9.3  PLT 280 370    Lab 11/07/11 0537 11/02/11 0500  NA 138 136  K 3.0* 3.6  CL 97 96  CO2 29 27  GLUCOSE 140* 147*  BUN 41* 41*  CREATININE 0.95 1.07  CALCIUM 9.8 9.9  MG 1.9 --  PHOS -- --   No results found for this basename: PROBNP:5 in the last 168 hours   No results found for this basename: PROCALCITON:5 in the last 168 hours ASSESSMENT AND PLAN  PULMONARY  A:   Acute on Chronic Respiratory Failure -multifactorial with aspiration pna, medullary  stroke and decondtioning. S/p Trach at [facility] 10/19/11.  Tracheal aspirate positive for stenotrophomonas in sputum 7/1. Looks comfortable off vent x 92 hours as of 7/15. P:   -continue to work on weaning... no immediate plans for decannulation (this will be determined by his strength and ability to clear secretions)   -abx per [facility] MD -mobilize / aggressive PT   Brett Canales Minor ACNP [facility] Pollack PCCM Pager [phone] till 3 pm If no answer page [phone] 11/07/2011, 9:39 AM   Independently examined pt, evaluated data & formulated above care plan with NP  [facility].

## 2011-11-08 LAB — POTASSIUM: Potassium: 3.8 mEq/L (ref 3.5–5.1)

## 2011-11-09 ENCOUNTER — Other Ambulatory Visit (HOSPITAL_COMMUNITY): Payer: Self-pay

## 2011-11-10 LAB — BASIC METABOLIC PANEL
BUN: 38 mg/dL — ABNORMAL HIGH (ref 6–23)
CO2: 26 mEq/L (ref 19–32)
Calcium: 9.7 mg/dL (ref 8.4–10.5)
Chloride: 105 mEq/L (ref 96–112)
Creatinine, Ser: 0.9 mg/dL (ref 0.50–1.35)
GFR calc Af Amer: >90 mL/min (ref >90)
GFR calc non Af Amer: 87 mL/min — ABNORMAL LOW (ref >90)
Glucose, Bld: 121 mg/dL — ABNORMAL HIGH (ref 70–99)
Potassium: 5.1 mEq/L (ref 3.5–5.1)
Sodium: 142 mEq/L (ref 135–145)

## 2011-11-10 LAB — CBC
HCT: 37.2 % — ABNORMAL LOW (ref 39.0–52.0)
Hemoglobin: 11.9 g/dL — ABNORMAL LOW (ref 13.0–17.0)
MCH: 29.2 pg (ref 26.0–34.0)
MCHC: 32 g/dL (ref 30.0–36.0)
MCV: 91.2 fL (ref 78.0–100.0)
Platelets: 257 10*3/uL (ref 150–400)
RBC: 4.08 MIL/uL — ABNORMAL LOW (ref 4.22–5.81)
RDW: 13.1 % (ref 11.5–15.5)
WBC: 10 10*3/uL (ref 4.0–10.5)

## 2011-11-11 ENCOUNTER — Other Ambulatory Visit (HOSPITAL_COMMUNITY): Payer: Self-pay

## 2011-11-11 DIAGNOSIS — I6350 Cerebral infarction due to unspecified occlusion or stenosis of unspecified cerebral artery: Secondary | ICD-10-CM | POA: Diagnosis not present

## 2011-11-11 DIAGNOSIS — R000 Tachycardia, unspecified: Secondary | ICD-10-CM | POA: Diagnosis not present

## 2011-11-11 DIAGNOSIS — J984 Other disorders of lung: Secondary | ICD-10-CM | POA: Diagnosis not present

## 2011-11-11 LAB — URINALYSIS, ROUTINE W REFLEX MICROSCOPIC
Bilirubin Urine: NEGATIVE
Glucose, UA: NEGATIVE mg/dL
Ketones, ur: NEGATIVE mg/dL
Nitrite: NEGATIVE
Protein, ur: NEGATIVE mg/dL
Specific Gravity, Urine: 1.015 (ref 1.005–1.030)
Urobilinogen, UA: 0.2 mg/dL (ref 0.0–1.0)
pH: 6.5 (ref 5.0–8.0)

## 2011-11-11 LAB — URINE MICROSCOPIC-ADD ON

## 2011-11-11 NOTE — Progress Notes (Signed)
Name: Dennis Delgado MRN: [id number] DOB: [birth year]    LOS: 21 Date of admit 10/21/2011  5:34 PM    Referring Provider:  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 [facility]11 for help with acute on chronic respiratory failure and chronic critical illness  Culture data 6/29 sputum: stenotrophomonas 7/1: sputum :  stenotrophomonas  abx levaquin (steno) 7/3>>>7/9 (stop date) EVENTS 7/1 - not weaning 7/3/ PCT normal and no evidence of sepsis / resp culture pos for stenotrophomonas in sputum 7/4 - 16 hours PSV 7/5 - 1 st day on  ATC 7/15 92 hours on t -collar 7/19 mild tachycardia Vital Signs: Vital signs reviewed. Abnormal values will appear under impression plan section.     Physical Examination: General:  Chronically ill in NAD on ATC Neuro:  some power on all extremities (1-2/5). Follows commands. Oriented.  HEENT:  No adenopathy Neck:  #8 Trach c/d/i cuff down Cardiovascular: s1s2 rrr, no m/r/g st 115 Lungs:  coarse rhonchi, even/ mild increased wob Abdomen:  +bs and tube feeds via peg Musculoskeletal:  weak Skin:  intact Dg Chest Port 1 View  11/11/2011  *RADIOLOGY REPORT*  Clinical Data: Tracheostomy.  Stroke.  Tachycardia.  Cough.  PORTABLE CHEST - 1 VIEW  Comparison: 10/24/2011  Findings: Tracheostomy remains in place.  Artifact overlies chest. The upper lungs are clear.  There is patchy density in both lung bases, similar to the previous study.  This could be atelectasis or mild basilar pneumonia.  No heart failure or effusion.  IMPRESSION: Mild patchy density at the bases that could be atelectasis or mild basilar pneumonia.   Similar appearance to the study of 10/24/2011.  Original Report Authenticated By: Thomasenia Sales, M.D.   Dg Swallowing Func-no Report  11/09/2011  CLINICAL DATA: eval swallowoing function   FLUOROSCOPY FOR SWALLOWING FUNCTION STUDY:  Fluoroscopy was provided for swallowing function study, which was  administered by a speech pathologist.  Final results and recommendations  from this study are contained within the speech pathology report.       Lab 11/10/11 0550 11/07/11 0537  HGB 11.9* 12.2*  HCT 37.2* 37.5*  WBC 10.0 10.6*  PLT 257 280    Lab 11/10/11 0550 11/08/11 0524 11/07/11 0537  NA 142 -- 138  K 5.1 3.8 --  CL 105 -- 97  CO2 26 -- 29  GLUCOSE 121* -- 140*  BUN 38* -- 41*  CREATININE 0.90 -- 0.95  CALCIUM 9.7 -- 9.8  MG -- -- 1.9  PHOS -- -- --   No results found for this basename: PROBNP:5 in the last 168 hours   No results found for this basename: PROCALCITON:5 in the last 168 hours ASSESSMENT AND PLAN  PULMONARY  A:   Acute on Chronic Respiratory Failure -multifactorial with aspiration pna, medullary stroke and decondtioning. S/p Trach at [facility] 10/19/11.  Tracheal aspirate positive for stenotrophomonas in sputum 7/1. Looks comfortable off vent x 1 week as of 7/19. Note mild tachycardia P:   -continue to work on weaning... no immediate plans for decannulation (this will be determined by his strength and ability to clear secretions)   -abx per [facility] MD -mobilize /  aggressive PT   Brett Canales Minor ACNP Adolph Pollack PCCM Pager [phone] till 3 pm If no answer page [phone] 11/11/2011, 9:46 AM   Independently examined pt, evaluated data & formulated above care plan with NP  [facility] V.

## 2011-11-12 ENCOUNTER — Other Ambulatory Visit (HOSPITAL_COMMUNITY): Payer: Self-pay

## 2011-11-12 DIAGNOSIS — R0989 Other specified symptoms and signs involving the circulatory and respiratory systems: Secondary | ICD-10-CM | POA: Diagnosis not present

## 2011-11-12 DIAGNOSIS — R059 Cough, unspecified: Secondary | ICD-10-CM | POA: Diagnosis not present

## 2011-11-12 DIAGNOSIS — J9600 Acute respiratory failure, unspecified whether with hypoxia or hypercapnia: Secondary | ICD-10-CM | POA: Diagnosis not present

## 2011-11-12 DIAGNOSIS — R05 Cough: Secondary | ICD-10-CM | POA: Diagnosis not present

## 2011-11-12 LAB — BASIC METABOLIC PANEL
BUN: 46 mg/dL — ABNORMAL HIGH (ref 6–23)
CO2: 25 mEq/L (ref 19–32)
Calcium: 9.5 mg/dL (ref 8.4–10.5)
Chloride: 94 mEq/L — ABNORMAL LOW (ref 96–112)
Creatinine, Ser: 1.5 mg/dL — ABNORMAL HIGH (ref 0.50–1.35)
GFR calc Af Amer: 54 mL/min — ABNORMAL LOW (ref >90)
GFR calc non Af Amer: 47 mL/min — ABNORMAL LOW (ref >90)
Glucose, Bld: 174 mg/dL — ABNORMAL HIGH (ref 70–99)
Potassium: 3.4 mEq/L — ABNORMAL LOW (ref 3.5–5.1)
Sodium: 134 mEq/L — ABNORMAL LOW (ref 135–145)

## 2011-11-12 LAB — CBC WITH DIFFERENTIAL/PLATELET
Basophils Absolute: 0 10*3/uL (ref 0.0–0.1)
Basophils Relative: 0 % (ref 0–1)
Eosinophils Absolute: 0.1 10*3/uL (ref 0.0–0.7)
Eosinophils Relative: 0 % (ref 0–5)
HCT: 34 % — ABNORMAL LOW (ref 39.0–52.0)
Hemoglobin: 11.2 g/dL — ABNORMAL LOW (ref 13.0–17.0)
Lymphocytes Relative: 4 % — ABNORMAL LOW (ref 12–46)
Lymphs Abs: 1.1 10*3/uL (ref 0.7–4.0)
MCH: 29.5 pg (ref 26.0–34.0)
MCHC: 32.9 g/dL (ref 30.0–36.0)
MCV: 89.5 fL (ref 78.0–100.0)
Monocytes Absolute: 1.1 10*3/uL — ABNORMAL HIGH (ref 0.1–1.0)
Monocytes Relative: 4 % (ref 3–12)
Neutro Abs: 26.5 10*3/uL — ABNORMAL HIGH (ref 1.7–7.7)
Neutrophils Relative %: 92 % — ABNORMAL HIGH (ref 43–77)
Platelets: 238 10*3/uL (ref 150–400)
RBC: 3.8 MIL/uL — ABNORMAL LOW (ref 4.22–5.81)
RDW: 13.4 % (ref 11.5–15.5)
WBC: 28.7 10*3/uL — ABNORMAL HIGH (ref 4.0–10.5)

## 2011-11-12 LAB — MAGNESIUM: Magnesium: 1.9 mg/dL (ref 1.5–2.5)

## 2011-11-13 LAB — CBC
HCT: 32.4 % — ABNORMAL LOW (ref 39.0–52.0)
Hemoglobin: 10.8 g/dL — ABNORMAL LOW (ref 13.0–17.0)
MCH: 29.5 pg (ref 26.0–34.0)
MCHC: 33.3 g/dL (ref 30.0–36.0)
MCV: 88.5 fL (ref 78.0–100.0)
Platelets: 259 10*3/uL (ref 150–400)
RBC: 3.66 MIL/uL — ABNORMAL LOW (ref 4.22–5.81)
RDW: 13.5 % (ref 11.5–15.5)
WBC: 13.8 10*3/uL — ABNORMAL HIGH (ref 4.0–10.5)

## 2011-11-13 LAB — BASIC METABOLIC PANEL
BUN: 41 mg/dL — ABNORMAL HIGH (ref 6–23)
CO2: 25 mEq/L (ref 19–32)
Calcium: 9 mg/dL (ref 8.4–10.5)
Chloride: 97 mEq/L (ref 96–112)
Creatinine, Ser: 1.11 mg/dL (ref 0.50–1.35)
GFR calc Af Amer: 78 mL/min — ABNORMAL LOW (ref >90)
GFR calc non Af Amer: 67 mL/min — ABNORMAL LOW (ref >90)
Glucose, Bld: 153 mg/dL — ABNORMAL HIGH (ref 70–99)
Potassium: 3.2 mEq/L — ABNORMAL LOW (ref 3.5–5.1)
Sodium: 134 mEq/L — ABNORMAL LOW (ref 135–145)

## 2011-11-14 ENCOUNTER — Other Ambulatory Visit (HOSPITAL_COMMUNITY): Payer: Self-pay

## 2011-11-14 DIAGNOSIS — J9819 Other pulmonary collapse: Secondary | ICD-10-CM | POA: Diagnosis not present

## 2011-11-14 DIAGNOSIS — J90 Pleural effusion, not elsewhere classified: Secondary | ICD-10-CM | POA: Diagnosis not present

## 2011-11-14 DIAGNOSIS — J811 Chronic pulmonary edema: Secondary | ICD-10-CM | POA: Diagnosis not present

## 2011-11-14 LAB — CBC
HCT: 34.2 % — ABNORMAL LOW (ref 39.0–52.0)
Hemoglobin: 11.7 g/dL — ABNORMAL LOW (ref 13.0–17.0)
MCH: 30.2 pg (ref 26.0–34.0)
MCHC: 34.2 g/dL (ref 30.0–36.0)
MCV: 88.1 fL (ref 78.0–100.0)
Platelets: 118 10*3/uL — ABNORMAL LOW (ref 150–400)
RBC: 3.88 MIL/uL — ABNORMAL LOW (ref 4.22–5.81)
RDW: 13.6 % (ref 11.5–15.5)
WBC: 7.4 10*3/uL (ref 4.0–10.5)

## 2011-11-14 LAB — BASIC METABOLIC PANEL
BUN: 33 mg/dL — ABNORMAL HIGH (ref 6–23)
CO2: 27 mEq/L (ref 19–32)
Calcium: 8.9 mg/dL (ref 8.4–10.5)
Chloride: 99 mEq/L (ref 96–112)
Creatinine, Ser: 1.04 mg/dL (ref 0.50–1.35)
GFR calc Af Amer: 84 mL/min — ABNORMAL LOW (ref >90)
GFR calc non Af Amer: 73 mL/min — ABNORMAL LOW (ref >90)
Glucose, Bld: 138 mg/dL — ABNORMAL HIGH (ref 70–99)
Potassium: 3.7 mEq/L (ref 3.5–5.3)
Sodium: 137 mEq/L (ref 135–145)

## 2011-11-14 LAB — URINE CULTURE: Colony Count: >=100000

## 2011-11-14 LAB — C-REACTIVE PROTEIN: CRP: 10.9 mg/dL — ABNORMAL HIGH (ref <0.60)

## 2011-11-14 LAB — CULTURE, RESPIRATORY W GRAM STAIN

## 2011-11-14 LAB — PREALBUMIN: Prealbumin: 18.4 mg/dL (ref 17.0–34.0)

## 2011-11-14 NOTE — Progress Notes (Signed)
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 [facility]11 for help with acute on chronic respiratory failure and chronic critical illness  Culture data 6/29 sputum>> stenotrophomonas 7/1 sputum>>stenotrophomonas  abx levaquin (steno) 7/3>>>7/9 (stop date) EVENTS 7/1 - not weaning 7/3 - PCT normal and no evidence of sepsis / resp culture pos for stenotrophomonas in sputum 7/4 - 16 hours PSV 7/5 - 1 st day on  ATC 7/15 - 92 hours on t -collar 7/19 - mild tachycardia 7/22 - tolerating short periods of trach cap, 2 hours on 7/21 but appears to have mild increase WOB on 7/22 with cap  Vital Signs: Vital signs reviewed. Abnormal values will appear under impression plan section.     Physical Examination: General:  Chronically ill in NAD on ATC Neuro:  some power on all extremities (1-2/5). Follows commands. Oriented.  HEENT:  No adenopathy Neck:  #8 Trach c/d/i cuff down Cardiovascular: s1s2 rrr, no m/r/g st 115 Lungs:  coarse rhonchi, even/ mild increased wob Abdomen:  +bs and tube feeds via peg Musculoskeletal:  weak Skin:  intact Dg Chest Port 1 View  11/14/2011  *RADIOLOGY REPORT*  Clinical Data: Ventilated patient, evaluate for infiltrates  PORTABLE CHEST - 1 VIEW  Comparison: Most recent prior chest x-ray 11/12/2011  Findings: Tracheostomy tube remains in good position.  The tip is midline and at the level of the clavicles.  Expiratory volumes are lower today, and there are new bibasilar opacities. Additionally,  pulmonary vascular markings are less distinct suggesting mild interstitial edema.  Unchanged cardiomegaly.  IMPRESSION:  1.  Lower inspiratory volumes with new bibasilar opacities favored to reflect a combination of atelectasis and pleural fluid. Infection not excluded in the appropriate clinical setting. 2.  Mild pulmonary edema. 3.  Small bilateral left greater than right pleural effusions.  Original Report Authenticated By: Vilma Prader     Lab 11/14/11 0524 11/13/11 1245 11/12/11 0550  HGB 11.7* 10.8* 11.2*  HCT 34.2* 32.4* 34.0*  WBC 7.4 13.8* 28.7*  PLT 118* 259 238    Lab 11/14/11 0524 11/13/11 1245 11/12/11 0550 11/10/11 0550  NA 137 134* 134* 142  K 3.7 3.2* -- --  CL 99 97 94* 105  CO2 27 25 25 26   GLUCOSE 138* 153* 174* 121*  BUN 33* 41* 46* 38*  CREATININE 1.04 1.11 1.50* 0.90  CALCIUM 8.9 9.0 9.5 9.7  MG -- -- 1.9 --  PHOS -- -- -- --   No results found for this basename: PROBNP:5 in the last 168 hours   No results found for this basename: PROCALCITON:5 in the last 168 hours  ASSESSMENT AND PLAN  PULMONARY  A:   Acute on Chronic Respiratory Failure -multifactorial with aspiration pna, medullary stroke and decondtioning. S/p Trach at [facility] 10/19/11.  Tracheal aspirate positive for stenotrophomonas in sputum 7/1. Looks comfortable off vent x 1 week as of 7/19. Note mild tachycardia  P:   -continue to work on weaning--no immediate plans for decannulation (this will be determined by his strength and  ability to clear secretions) -abx per [facility] MD -mobilize / aggressive PT -caution with capping of trach.  Appears to have increased WOB 7/22 with capping.      Canary Brim, NP-C [place] Pgr: [phone] or [phone]  Billy Fischer, MD ; [facility] service Mobile [phone].  After 5:30 PM or weekends, call [phone]

## 2011-11-16 NOTE — Progress Notes (Signed)
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 [facility]11 for help with acute on chronic respiratory failure and chronic critical illness  Culture data 6/29 sputum>> stenotrophomonas 7/1 sputum>>stenotrophomonas  abx levaquin (steno) 7/3>>>7/9 (stop date) EVENTS 7/1 - not weaning 7/3 - PCT normal and no evidence of sepsis / resp culture pos for stenotrophomonas in sputum 7/4 - 16 hours PSV 7/5 - 1 st day on  ATC 7/15 - 92 hours on t -collar 7/19 - mild tachycardia 7/22 - tolerating short periods of trach cap, 2 hours on 7/21 but appears to have mild increase WOB on 7/22 with cap 7/24- cap daytime, trach collar 21% night  Vital Signs: Vital signs reviewed.   Physical Examination: General:  Chronically ill in NAD on ATC Neuro:  some power on all extremities (1-2/5). Follows commands. Oriented.  HEENT:  No adenopathy Neck:  #6 cuffed Cardiovascular: s1s2 rrr, no m/r/g st 115 Lungs:  ronchi Abdomen:  +bs and tube feeds via peg Musculoskeletal:  weak Skin:  intact No results found.   Lab 11/14/11 0524 11/13/11 1245 11/12/11 0550  HGB 11.7* 10.8* 11.2*  HCT 34.2* 32.4* 34.0*  WBC 7.4 13.8* 28.7*  PLT 118* 259 238    Lab 11/14/11 0524 11/13/11 1245 11/12/11 0550 11/10/11 0550  NA 137 134* 134* 142  K 3.7 3.2* -- --  CL 99 97 94* 105  CO2 27 25 25 26   GLUCOSE 138* 153* 174* 121*  BUN 33* 41* 46* 38*  CREATININE 1.04 1.11 1.50* 0.90  CALCIUM 8.9 9.0 9.5 9.7  MG -- -- 1.9 --  PHOS -- -- -- --   No results  found for this basename: PROBNP:5 in the last 168 hours   No results found for this basename: PROCALCITON:5 in the last 168 hours  ASSESSMENT AND PLAN  PULMONARY  A:   Acute on Chronic Respiratory Failure -multifactorial with aspiration pna, medullary stroke and decondtioning. S/p Trach at [facility] 10/19/11.  Tracheal aspirate positive for stenotrophomonas in sputum 7/1. Looks comfortable off vent x 1 week as of 7/19. Note mild tachycardia  P:   -asked to comment on decannulation -multiple barriers still -needs change trach to 6 cuffless -needs reduction secretions -pmv maximize -need to have 3 days in a row of cap all day long  Appears like HE WILL NOT get decannulation  Mcarthur Rossetti. Tyson Alias, MD, FACP Pgr: [phone] [facility] [facility]

## 2011-11-18 DIAGNOSIS — J159 Unspecified bacterial pneumonia: Secondary | ICD-10-CM | POA: Diagnosis not present

## 2011-11-18 DIAGNOSIS — F450 Somatization disorder: Secondary | ICD-10-CM | POA: Diagnosis not present

## 2011-11-18 DIAGNOSIS — Z430 Encounter for attention to tracheostomy: Secondary | ICD-10-CM | POA: Diagnosis not present

## 2011-11-18 DIAGNOSIS — Z431 Encounter for attention to gastrostomy: Secondary | ICD-10-CM | POA: Diagnosis not present

## 2011-11-18 DIAGNOSIS — R499 Unspecified voice and resonance disorder: Secondary | ICD-10-CM | POA: Diagnosis not present

## 2011-11-18 DIAGNOSIS — J9503 Malfunction of tracheostomy stoma: Secondary | ICD-10-CM | POA: Diagnosis not present

## 2011-11-18 DIAGNOSIS — G4700 Insomnia, unspecified: Secondary | ICD-10-CM | POA: Diagnosis not present

## 2011-11-18 DIAGNOSIS — I6789 Other cerebrovascular disease: Secondary | ICD-10-CM | POA: Diagnosis not present

## 2011-11-18 DIAGNOSIS — G459 Transient cerebral ischemic attack, unspecified: Secondary | ICD-10-CM | POA: Diagnosis not present

## 2011-11-18 DIAGNOSIS — E46 Unspecified protein-calorie malnutrition: Secondary | ICD-10-CM | POA: Diagnosis not present

## 2011-11-18 DIAGNOSIS — M79609 Pain in unspecified limb: Secondary | ICD-10-CM | POA: Diagnosis not present

## 2011-11-18 DIAGNOSIS — G8250 Quadriplegia, unspecified: Secondary | ICD-10-CM | POA: Diagnosis not present

## 2011-11-18 DIAGNOSIS — F341 Dysthymic disorder: Secondary | ICD-10-CM | POA: Diagnosis not present

## 2011-11-18 DIAGNOSIS — L89109 Pressure ulcer of unspecified part of back, unspecified stage: Secondary | ICD-10-CM | POA: Diagnosis not present

## 2011-11-18 DIAGNOSIS — R1312 Dysphagia, oropharyngeal phase: Secondary | ICD-10-CM | POA: Diagnosis not present

## 2011-11-18 DIAGNOSIS — B370 Candidal stomatitis: Secondary | ICD-10-CM | POA: Diagnosis not present

## 2011-11-18 DIAGNOSIS — I69922 Dysarthria following unspecified cerebrovascular disease: Secondary | ICD-10-CM | POA: Diagnosis not present

## 2011-11-18 DIAGNOSIS — J984 Other disorders of lung: Secondary | ICD-10-CM | POA: Diagnosis not present

## 2011-11-18 DIAGNOSIS — G835 Locked-in state: Secondary | ICD-10-CM | POA: Diagnosis not present

## 2011-11-18 DIAGNOSIS — J811 Chronic pulmonary edema: Secondary | ICD-10-CM | POA: Diagnosis not present

## 2011-11-18 DIAGNOSIS — L600 Ingrowing nail: Secondary | ICD-10-CM | POA: Diagnosis not present

## 2011-11-18 DIAGNOSIS — Z8673 Personal history of transient ischemic attack (TIA), and cerebral infarction without residual deficits: Secondary | ICD-10-CM | POA: Diagnosis not present

## 2011-11-18 DIAGNOSIS — J9600 Acute respiratory failure, unspecified whether with hypoxia or hypercapnia: Secondary | ICD-10-CM | POA: Diagnosis not present

## 2011-11-18 DIAGNOSIS — J9509 Other tracheostomy complication: Secondary | ICD-10-CM | POA: Diagnosis not present

## 2011-11-18 DIAGNOSIS — R5381 Other malaise: Secondary | ICD-10-CM | POA: Diagnosis not present

## 2011-11-18 DIAGNOSIS — F411 Generalized anxiety disorder: Secondary | ICD-10-CM | POA: Diagnosis not present

## 2011-11-18 DIAGNOSIS — J9500 Unspecified tracheostomy complication: Secondary | ICD-10-CM | POA: Diagnosis not present

## 2011-11-18 DIAGNOSIS — I69969 Other paralytic syndrome following unspecified cerebrovascular disease affecting unspecified side: Secondary | ICD-10-CM | POA: Diagnosis not present

## 2011-11-18 DIAGNOSIS — R1310 Dysphagia, unspecified: Secondary | ICD-10-CM | POA: Diagnosis not present

## 2011-11-18 DIAGNOSIS — I10 Essential (primary) hypertension: Secondary | ICD-10-CM | POA: Diagnosis not present

## 2011-11-18 DIAGNOSIS — H109 Unspecified conjunctivitis: Secondary | ICD-10-CM | POA: Diagnosis not present

## 2011-11-18 DIAGNOSIS — E7800 Pure hypercholesterolemia, unspecified: Secondary | ICD-10-CM | POA: Diagnosis not present

## 2011-11-18 DIAGNOSIS — Z79899 Other long term (current) drug therapy: Secondary | ICD-10-CM | POA: Diagnosis not present

## 2011-11-18 DIAGNOSIS — B351 Tinea unguium: Secondary | ICD-10-CM | POA: Diagnosis not present

## 2011-11-18 DIAGNOSIS — R110 Nausea: Secondary | ICD-10-CM | POA: Diagnosis not present

## 2011-11-18 DIAGNOSIS — M6281 Muscle weakness (generalized): Secondary | ICD-10-CM | POA: Diagnosis not present

## 2011-11-18 DIAGNOSIS — A0472 Enterocolitis due to Clostridium difficile, not specified as recurrent: Secondary | ICD-10-CM | POA: Diagnosis not present

## 2011-11-18 DIAGNOSIS — L8992 Pressure ulcer of unspecified site, stage 2: Secondary | ICD-10-CM | POA: Diagnosis not present

## 2011-11-18 DIAGNOSIS — N390 Urinary tract infection, site not specified: Secondary | ICD-10-CM | POA: Diagnosis not present

## 2011-11-18 DIAGNOSIS — L03039 Cellulitis of unspecified toe: Secondary | ICD-10-CM | POA: Diagnosis not present

## 2011-11-18 DIAGNOSIS — J9610 Chronic respiratory failure, unspecified whether with hypoxia or hypercapnia: Secondary | ICD-10-CM | POA: Diagnosis not present

## 2011-11-18 DIAGNOSIS — R269 Unspecified abnormalities of gait and mobility: Secondary | ICD-10-CM | POA: Diagnosis not present

## 2011-11-18 DIAGNOSIS — L02619 Cutaneous abscess of unspecified foot: Secondary | ICD-10-CM | POA: Diagnosis not present

## 2011-11-18 DIAGNOSIS — M6250 Muscle wasting and atrophy, not elsewhere classified, unspecified site: Secondary | ICD-10-CM | POA: Diagnosis not present

## 2011-11-18 DIAGNOSIS — R21 Rash and other nonspecific skin eruption: Secondary | ICD-10-CM | POA: Diagnosis not present

## 2011-11-18 LAB — CULTURE, BLOOD (ROUTINE X 2)
Culture: NO GROWTH
Culture: NO GROWTH

## 2011-11-21 DIAGNOSIS — R269 Unspecified abnormalities of gait and mobility: Secondary | ICD-10-CM | POA: Diagnosis not present

## 2011-11-21 DIAGNOSIS — N390 Urinary tract infection, site not specified: Secondary | ICD-10-CM | POA: Diagnosis not present

## 2011-11-21 DIAGNOSIS — I6789 Other cerebrovascular disease: Secondary | ICD-10-CM | POA: Diagnosis not present

## 2011-11-21 DIAGNOSIS — J9600 Acute respiratory failure, unspecified whether with hypoxia or hypercapnia: Secondary | ICD-10-CM | POA: Diagnosis not present

## 2011-12-09 DIAGNOSIS — F341 Dysthymic disorder: Secondary | ICD-10-CM | POA: Diagnosis not present

## 2011-12-09 DIAGNOSIS — R110 Nausea: Secondary | ICD-10-CM | POA: Diagnosis not present

## 2011-12-16 DIAGNOSIS — L89109 Pressure ulcer of unspecified part of back, unspecified stage: Secondary | ICD-10-CM | POA: Diagnosis not present

## 2011-12-16 DIAGNOSIS — L8992 Pressure ulcer of unspecified site, stage 2: Secondary | ICD-10-CM | POA: Diagnosis not present

## 2011-12-16 DIAGNOSIS — J9600 Acute respiratory failure, unspecified whether with hypoxia or hypercapnia: Secondary | ICD-10-CM | POA: Diagnosis not present

## 2011-12-16 DIAGNOSIS — B370 Candidal stomatitis: Secondary | ICD-10-CM | POA: Diagnosis not present

## 2011-12-23 DIAGNOSIS — F341 Dysthymic disorder: Secondary | ICD-10-CM | POA: Diagnosis not present

## 2011-12-27 DIAGNOSIS — J9600 Acute respiratory failure, unspecified whether with hypoxia or hypercapnia: Secondary | ICD-10-CM | POA: Diagnosis not present

## 2011-12-27 DIAGNOSIS — I10 Essential (primary) hypertension: Secondary | ICD-10-CM | POA: Diagnosis not present

## 2011-12-27 DIAGNOSIS — I6789 Other cerebrovascular disease: Secondary | ICD-10-CM | POA: Diagnosis not present

## 2011-12-27 DIAGNOSIS — R5381 Other malaise: Secondary | ICD-10-CM | POA: Diagnosis not present

## 2012-01-03 DIAGNOSIS — I6789 Other cerebrovascular disease: Secondary | ICD-10-CM | POA: Diagnosis not present

## 2012-01-03 DIAGNOSIS — R1310 Dysphagia, unspecified: Secondary | ICD-10-CM | POA: Diagnosis not present

## 2012-01-06 DIAGNOSIS — G4700 Insomnia, unspecified: Secondary | ICD-10-CM | POA: Diagnosis not present

## 2012-01-06 DIAGNOSIS — F341 Dysthymic disorder: Secondary | ICD-10-CM | POA: Diagnosis not present

## 2012-01-06 DIAGNOSIS — G4730 Sleep apnea, unspecified: Secondary | ICD-10-CM | POA: Diagnosis not present

## 2012-01-06 DIAGNOSIS — F450 Somatization disorder: Secondary | ICD-10-CM | POA: Diagnosis not present

## 2012-01-07 DIAGNOSIS — J811 Chronic pulmonary edema: Secondary | ICD-10-CM | POA: Diagnosis not present

## 2012-01-24 DIAGNOSIS — J9610 Chronic respiratory failure, unspecified whether with hypoxia or hypercapnia: Secondary | ICD-10-CM | POA: Diagnosis not present

## 2012-01-24 DIAGNOSIS — I69922 Dysarthria following unspecified cerebrovascular disease: Secondary | ICD-10-CM | POA: Diagnosis not present

## 2012-01-24 DIAGNOSIS — R1310 Dysphagia, unspecified: Secondary | ICD-10-CM | POA: Diagnosis not present

## 2012-01-24 DIAGNOSIS — I69969 Other paralytic syndrome following unspecified cerebrovascular disease affecting unspecified side: Secondary | ICD-10-CM | POA: Diagnosis not present

## 2012-01-24 DIAGNOSIS — J9509 Other tracheostomy complication: Secondary | ICD-10-CM | POA: Diagnosis not present

## 2012-01-24 DIAGNOSIS — J9500 Unspecified tracheostomy complication: Secondary | ICD-10-CM | POA: Diagnosis not present

## 2012-01-24 DIAGNOSIS — Z431 Encounter for attention to gastrostomy: Secondary | ICD-10-CM | POA: Diagnosis not present

## 2012-01-24 DIAGNOSIS — G835 Locked-in state: Secondary | ICD-10-CM | POA: Diagnosis not present

## 2012-01-24 DIAGNOSIS — M6250 Muscle wasting and atrophy, not elsewhere classified, unspecified site: Secondary | ICD-10-CM | POA: Diagnosis not present

## 2012-01-24 DIAGNOSIS — I10 Essential (primary) hypertension: Secondary | ICD-10-CM | POA: Diagnosis not present

## 2012-01-24 DIAGNOSIS — M6281 Muscle weakness (generalized): Secondary | ICD-10-CM | POA: Diagnosis not present

## 2012-01-24 DIAGNOSIS — E7800 Pure hypercholesterolemia, unspecified: Secondary | ICD-10-CM | POA: Diagnosis not present

## 2012-01-24 DIAGNOSIS — Z8673 Personal history of transient ischemic attack (TIA), and cerebral infarction without residual deficits: Secondary | ICD-10-CM | POA: Diagnosis not present

## 2012-01-24 DIAGNOSIS — J9503 Malfunction of tracheostomy stoma: Secondary | ICD-10-CM | POA: Diagnosis not present

## 2012-01-24 DIAGNOSIS — G459 Transient cerebral ischemic attack, unspecified: Secondary | ICD-10-CM | POA: Diagnosis not present

## 2012-01-24 DIAGNOSIS — R499 Unspecified voice and resonance disorder: Secondary | ICD-10-CM | POA: Diagnosis not present

## 2012-01-24 DIAGNOSIS — Z430 Encounter for attention to tracheostomy: Secondary | ICD-10-CM | POA: Diagnosis not present

## 2012-01-29 DIAGNOSIS — E7800 Pure hypercholesterolemia, unspecified: Secondary | ICD-10-CM | POA: Diagnosis not present

## 2012-01-29 DIAGNOSIS — Z430 Encounter for attention to tracheostomy: Secondary | ICD-10-CM | POA: Diagnosis not present

## 2012-01-29 DIAGNOSIS — Z8673 Personal history of transient ischemic attack (TIA), and cerebral infarction without residual deficits: Secondary | ICD-10-CM | POA: Diagnosis not present

## 2012-01-29 DIAGNOSIS — I10 Essential (primary) hypertension: Secondary | ICD-10-CM | POA: Diagnosis not present

## 2012-01-29 DIAGNOSIS — J9503 Malfunction of tracheostomy stoma: Secondary | ICD-10-CM | POA: Diagnosis not present

## 2012-02-07 DIAGNOSIS — R499 Unspecified voice and resonance disorder: Secondary | ICD-10-CM | POA: Diagnosis not present

## 2012-02-14 DIAGNOSIS — Z430 Encounter for attention to tracheostomy: Secondary | ICD-10-CM | POA: Diagnosis not present

## 2012-02-24 LAB — CULTURE, RESPIRATORY W GRAM STAIN

## 2012-02-27 DIAGNOSIS — G835 Locked-in state: Secondary | ICD-10-CM | POA: Diagnosis not present

## 2012-02-27 DIAGNOSIS — I69922 Dysarthria following unspecified cerebrovascular disease: Secondary | ICD-10-CM | POA: Diagnosis not present

## 2012-02-27 DIAGNOSIS — M6281 Muscle weakness (generalized): Secondary | ICD-10-CM | POA: Diagnosis not present

## 2012-02-27 DIAGNOSIS — R1310 Dysphagia, unspecified: Secondary | ICD-10-CM | POA: Diagnosis not present

## 2012-02-27 DIAGNOSIS — E46 Unspecified protein-calorie malnutrition: Secondary | ICD-10-CM | POA: Diagnosis not present

## 2012-02-27 DIAGNOSIS — M6250 Muscle wasting and atrophy, not elsewhere classified, unspecified site: Secondary | ICD-10-CM | POA: Diagnosis not present

## 2012-02-27 DIAGNOSIS — I69969 Other paralytic syndrome following unspecified cerebrovascular disease affecting unspecified side: Secondary | ICD-10-CM | POA: Diagnosis not present

## 2012-02-28 DIAGNOSIS — M6250 Muscle wasting and atrophy, not elsewhere classified, unspecified site: Secondary | ICD-10-CM | POA: Diagnosis not present

## 2012-02-28 DIAGNOSIS — I69922 Dysarthria following unspecified cerebrovascular disease: Secondary | ICD-10-CM | POA: Diagnosis not present

## 2012-02-28 DIAGNOSIS — M6281 Muscle weakness (generalized): Secondary | ICD-10-CM | POA: Diagnosis not present

## 2012-02-28 DIAGNOSIS — I69969 Other paralytic syndrome following unspecified cerebrovascular disease affecting unspecified side: Secondary | ICD-10-CM | POA: Diagnosis not present

## 2012-02-28 DIAGNOSIS — R1310 Dysphagia, unspecified: Secondary | ICD-10-CM | POA: Diagnosis not present

## 2012-02-28 DIAGNOSIS — E46 Unspecified protein-calorie malnutrition: Secondary | ICD-10-CM | POA: Diagnosis not present

## 2012-02-29 DIAGNOSIS — M6281 Muscle weakness (generalized): Secondary | ICD-10-CM | POA: Diagnosis not present

## 2012-02-29 DIAGNOSIS — M6250 Muscle wasting and atrophy, not elsewhere classified, unspecified site: Secondary | ICD-10-CM | POA: Diagnosis not present

## 2012-02-29 DIAGNOSIS — E46 Unspecified protein-calorie malnutrition: Secondary | ICD-10-CM | POA: Diagnosis not present

## 2012-02-29 DIAGNOSIS — R1310 Dysphagia, unspecified: Secondary | ICD-10-CM | POA: Diagnosis not present

## 2012-02-29 DIAGNOSIS — I69922 Dysarthria following unspecified cerebrovascular disease: Secondary | ICD-10-CM | POA: Diagnosis not present

## 2012-02-29 DIAGNOSIS — I69969 Other paralytic syndrome following unspecified cerebrovascular disease affecting unspecified side: Secondary | ICD-10-CM | POA: Diagnosis not present

## 2012-03-01 DIAGNOSIS — R1310 Dysphagia, unspecified: Secondary | ICD-10-CM | POA: Diagnosis not present

## 2012-03-01 DIAGNOSIS — M6250 Muscle wasting and atrophy, not elsewhere classified, unspecified site: Secondary | ICD-10-CM | POA: Diagnosis not present

## 2012-03-01 DIAGNOSIS — I69969 Other paralytic syndrome following unspecified cerebrovascular disease affecting unspecified side: Secondary | ICD-10-CM | POA: Diagnosis not present

## 2012-03-01 DIAGNOSIS — I69922 Dysarthria following unspecified cerebrovascular disease: Secondary | ICD-10-CM | POA: Diagnosis not present

## 2012-03-01 DIAGNOSIS — M6281 Muscle weakness (generalized): Secondary | ICD-10-CM | POA: Diagnosis not present

## 2012-03-01 DIAGNOSIS — E46 Unspecified protein-calorie malnutrition: Secondary | ICD-10-CM | POA: Diagnosis not present

## 2012-03-02 DIAGNOSIS — I69922 Dysarthria following unspecified cerebrovascular disease: Secondary | ICD-10-CM | POA: Diagnosis not present

## 2012-03-02 DIAGNOSIS — E46 Unspecified protein-calorie malnutrition: Secondary | ICD-10-CM | POA: Diagnosis not present

## 2012-03-02 DIAGNOSIS — M6250 Muscle wasting and atrophy, not elsewhere classified, unspecified site: Secondary | ICD-10-CM | POA: Diagnosis not present

## 2012-03-02 DIAGNOSIS — F411 Generalized anxiety disorder: Secondary | ICD-10-CM | POA: Diagnosis not present

## 2012-03-02 DIAGNOSIS — I6789 Other cerebrovascular disease: Secondary | ICD-10-CM | POA: Diagnosis not present

## 2012-03-02 DIAGNOSIS — I69969 Other paralytic syndrome following unspecified cerebrovascular disease affecting unspecified side: Secondary | ICD-10-CM | POA: Diagnosis not present

## 2012-03-02 DIAGNOSIS — M6281 Muscle weakness (generalized): Secondary | ICD-10-CM | POA: Diagnosis not present

## 2012-03-02 DIAGNOSIS — R1310 Dysphagia, unspecified: Secondary | ICD-10-CM | POA: Diagnosis not present

## 2012-03-05 DIAGNOSIS — M6281 Muscle weakness (generalized): Secondary | ICD-10-CM | POA: Diagnosis not present

## 2012-03-05 DIAGNOSIS — R1310 Dysphagia, unspecified: Secondary | ICD-10-CM | POA: Diagnosis not present

## 2012-03-05 DIAGNOSIS — M6250 Muscle wasting and atrophy, not elsewhere classified, unspecified site: Secondary | ICD-10-CM | POA: Diagnosis not present

## 2012-03-05 DIAGNOSIS — I69922 Dysarthria following unspecified cerebrovascular disease: Secondary | ICD-10-CM | POA: Diagnosis not present

## 2012-03-05 DIAGNOSIS — I69969 Other paralytic syndrome following unspecified cerebrovascular disease affecting unspecified side: Secondary | ICD-10-CM | POA: Diagnosis not present

## 2012-03-05 DIAGNOSIS — E46 Unspecified protein-calorie malnutrition: Secondary | ICD-10-CM | POA: Diagnosis not present

## 2012-03-06 DIAGNOSIS — E46 Unspecified protein-calorie malnutrition: Secondary | ICD-10-CM | POA: Diagnosis not present

## 2012-03-06 DIAGNOSIS — R1310 Dysphagia, unspecified: Secondary | ICD-10-CM | POA: Diagnosis not present

## 2012-03-06 DIAGNOSIS — M6281 Muscle weakness (generalized): Secondary | ICD-10-CM | POA: Diagnosis not present

## 2012-03-06 DIAGNOSIS — I69969 Other paralytic syndrome following unspecified cerebrovascular disease affecting unspecified side: Secondary | ICD-10-CM | POA: Diagnosis not present

## 2012-03-06 DIAGNOSIS — I69922 Dysarthria following unspecified cerebrovascular disease: Secondary | ICD-10-CM | POA: Diagnosis not present

## 2012-03-06 DIAGNOSIS — M6250 Muscle wasting and atrophy, not elsewhere classified, unspecified site: Secondary | ICD-10-CM | POA: Diagnosis not present

## 2012-03-07 DIAGNOSIS — I69922 Dysarthria following unspecified cerebrovascular disease: Secondary | ICD-10-CM | POA: Diagnosis not present

## 2012-03-07 DIAGNOSIS — E46 Unspecified protein-calorie malnutrition: Secondary | ICD-10-CM | POA: Diagnosis not present

## 2012-03-07 DIAGNOSIS — I69969 Other paralytic syndrome following unspecified cerebrovascular disease affecting unspecified side: Secondary | ICD-10-CM | POA: Diagnosis not present

## 2012-03-07 DIAGNOSIS — M6250 Muscle wasting and atrophy, not elsewhere classified, unspecified site: Secondary | ICD-10-CM | POA: Diagnosis not present

## 2012-03-07 DIAGNOSIS — R1310 Dysphagia, unspecified: Secondary | ICD-10-CM | POA: Diagnosis not present

## 2012-03-07 DIAGNOSIS — M6281 Muscle weakness (generalized): Secondary | ICD-10-CM | POA: Diagnosis not present

## 2012-03-08 DIAGNOSIS — M6281 Muscle weakness (generalized): Secondary | ICD-10-CM | POA: Diagnosis not present

## 2012-03-08 DIAGNOSIS — I69922 Dysarthria following unspecified cerebrovascular disease: Secondary | ICD-10-CM | POA: Diagnosis not present

## 2012-03-08 DIAGNOSIS — E46 Unspecified protein-calorie malnutrition: Secondary | ICD-10-CM | POA: Diagnosis not present

## 2012-03-08 DIAGNOSIS — M6250 Muscle wasting and atrophy, not elsewhere classified, unspecified site: Secondary | ICD-10-CM | POA: Diagnosis not present

## 2012-03-08 DIAGNOSIS — R1310 Dysphagia, unspecified: Secondary | ICD-10-CM | POA: Diagnosis not present

## 2012-03-08 DIAGNOSIS — I69969 Other paralytic syndrome following unspecified cerebrovascular disease affecting unspecified side: Secondary | ICD-10-CM | POA: Diagnosis not present

## 2012-03-09 DIAGNOSIS — M6250 Muscle wasting and atrophy, not elsewhere classified, unspecified site: Secondary | ICD-10-CM | POA: Diagnosis not present

## 2012-03-09 DIAGNOSIS — M6281 Muscle weakness (generalized): Secondary | ICD-10-CM | POA: Diagnosis not present

## 2012-03-09 DIAGNOSIS — E46 Unspecified protein-calorie malnutrition: Secondary | ICD-10-CM | POA: Diagnosis not present

## 2012-03-09 DIAGNOSIS — I69922 Dysarthria following unspecified cerebrovascular disease: Secondary | ICD-10-CM | POA: Diagnosis not present

## 2012-03-09 DIAGNOSIS — I69969 Other paralytic syndrome following unspecified cerebrovascular disease affecting unspecified side: Secondary | ICD-10-CM | POA: Diagnosis not present

## 2012-03-09 DIAGNOSIS — R1310 Dysphagia, unspecified: Secondary | ICD-10-CM | POA: Diagnosis not present

## 2012-03-12 DIAGNOSIS — E46 Unspecified protein-calorie malnutrition: Secondary | ICD-10-CM | POA: Diagnosis not present

## 2012-03-12 DIAGNOSIS — I69922 Dysarthria following unspecified cerebrovascular disease: Secondary | ICD-10-CM | POA: Diagnosis not present

## 2012-03-12 DIAGNOSIS — I69969 Other paralytic syndrome following unspecified cerebrovascular disease affecting unspecified side: Secondary | ICD-10-CM | POA: Diagnosis not present

## 2012-03-12 DIAGNOSIS — M6281 Muscle weakness (generalized): Secondary | ICD-10-CM | POA: Diagnosis not present

## 2012-03-12 DIAGNOSIS — M6250 Muscle wasting and atrophy, not elsewhere classified, unspecified site: Secondary | ICD-10-CM | POA: Diagnosis not present

## 2012-03-12 DIAGNOSIS — R1310 Dysphagia, unspecified: Secondary | ICD-10-CM | POA: Diagnosis not present

## 2012-03-13 DIAGNOSIS — M6250 Muscle wasting and atrophy, not elsewhere classified, unspecified site: Secondary | ICD-10-CM | POA: Diagnosis not present

## 2012-03-13 DIAGNOSIS — I69969 Other paralytic syndrome following unspecified cerebrovascular disease affecting unspecified side: Secondary | ICD-10-CM | POA: Diagnosis not present

## 2012-03-13 DIAGNOSIS — M6281 Muscle weakness (generalized): Secondary | ICD-10-CM | POA: Diagnosis not present

## 2012-03-13 DIAGNOSIS — I69922 Dysarthria following unspecified cerebrovascular disease: Secondary | ICD-10-CM | POA: Diagnosis not present

## 2012-03-13 DIAGNOSIS — R1310 Dysphagia, unspecified: Secondary | ICD-10-CM | POA: Diagnosis not present

## 2012-03-13 DIAGNOSIS — E46 Unspecified protein-calorie malnutrition: Secondary | ICD-10-CM | POA: Diagnosis not present

## 2012-03-14 DIAGNOSIS — I69969 Other paralytic syndrome following unspecified cerebrovascular disease affecting unspecified side: Secondary | ICD-10-CM | POA: Diagnosis not present

## 2012-03-14 DIAGNOSIS — M6281 Muscle weakness (generalized): Secondary | ICD-10-CM | POA: Diagnosis not present

## 2012-03-14 DIAGNOSIS — E46 Unspecified protein-calorie malnutrition: Secondary | ICD-10-CM | POA: Diagnosis not present

## 2012-03-14 DIAGNOSIS — R1310 Dysphagia, unspecified: Secondary | ICD-10-CM | POA: Diagnosis not present

## 2012-03-14 DIAGNOSIS — I69922 Dysarthria following unspecified cerebrovascular disease: Secondary | ICD-10-CM | POA: Diagnosis not present

## 2012-03-14 DIAGNOSIS — M6250 Muscle wasting and atrophy, not elsewhere classified, unspecified site: Secondary | ICD-10-CM | POA: Diagnosis not present

## 2012-03-15 DIAGNOSIS — I69922 Dysarthria following unspecified cerebrovascular disease: Secondary | ICD-10-CM | POA: Diagnosis not present

## 2012-03-15 DIAGNOSIS — R1310 Dysphagia, unspecified: Secondary | ICD-10-CM | POA: Diagnosis not present

## 2012-03-15 DIAGNOSIS — E46 Unspecified protein-calorie malnutrition: Secondary | ICD-10-CM | POA: Diagnosis not present

## 2012-03-15 DIAGNOSIS — M6281 Muscle weakness (generalized): Secondary | ICD-10-CM | POA: Diagnosis not present

## 2012-03-15 DIAGNOSIS — I69969 Other paralytic syndrome following unspecified cerebrovascular disease affecting unspecified side: Secondary | ICD-10-CM | POA: Diagnosis not present

## 2012-03-15 DIAGNOSIS — M6250 Muscle wasting and atrophy, not elsewhere classified, unspecified site: Secondary | ICD-10-CM | POA: Diagnosis not present

## 2012-03-16 DIAGNOSIS — M6250 Muscle wasting and atrophy, not elsewhere classified, unspecified site: Secondary | ICD-10-CM | POA: Diagnosis not present

## 2012-03-16 DIAGNOSIS — E46 Unspecified protein-calorie malnutrition: Secondary | ICD-10-CM | POA: Diagnosis not present

## 2012-03-16 DIAGNOSIS — R1310 Dysphagia, unspecified: Secondary | ICD-10-CM | POA: Diagnosis not present

## 2012-03-16 DIAGNOSIS — I69969 Other paralytic syndrome following unspecified cerebrovascular disease affecting unspecified side: Secondary | ICD-10-CM | POA: Diagnosis not present

## 2012-03-16 DIAGNOSIS — I69922 Dysarthria following unspecified cerebrovascular disease: Secondary | ICD-10-CM | POA: Diagnosis not present

## 2012-03-16 DIAGNOSIS — M6281 Muscle weakness (generalized): Secondary | ICD-10-CM | POA: Diagnosis not present

## 2012-03-19 DIAGNOSIS — M6281 Muscle weakness (generalized): Secondary | ICD-10-CM | POA: Diagnosis not present

## 2012-03-19 DIAGNOSIS — M6250 Muscle wasting and atrophy, not elsewhere classified, unspecified site: Secondary | ICD-10-CM | POA: Diagnosis not present

## 2012-03-19 DIAGNOSIS — R1310 Dysphagia, unspecified: Secondary | ICD-10-CM | POA: Diagnosis not present

## 2012-03-19 DIAGNOSIS — I69922 Dysarthria following unspecified cerebrovascular disease: Secondary | ICD-10-CM | POA: Diagnosis not present

## 2012-03-19 DIAGNOSIS — E46 Unspecified protein-calorie malnutrition: Secondary | ICD-10-CM | POA: Diagnosis not present

## 2012-03-19 DIAGNOSIS — I69969 Other paralytic syndrome following unspecified cerebrovascular disease affecting unspecified side: Secondary | ICD-10-CM | POA: Diagnosis not present

## 2012-03-20 DIAGNOSIS — M6281 Muscle weakness (generalized): Secondary | ICD-10-CM | POA: Diagnosis not present

## 2012-03-20 DIAGNOSIS — M6250 Muscle wasting and atrophy, not elsewhere classified, unspecified site: Secondary | ICD-10-CM | POA: Diagnosis not present

## 2012-03-20 DIAGNOSIS — I69922 Dysarthria following unspecified cerebrovascular disease: Secondary | ICD-10-CM | POA: Diagnosis not present

## 2012-03-20 DIAGNOSIS — R1310 Dysphagia, unspecified: Secondary | ICD-10-CM | POA: Diagnosis not present

## 2012-03-20 DIAGNOSIS — E46 Unspecified protein-calorie malnutrition: Secondary | ICD-10-CM | POA: Diagnosis not present

## 2012-03-20 DIAGNOSIS — H109 Unspecified conjunctivitis: Secondary | ICD-10-CM | POA: Diagnosis not present

## 2012-03-20 DIAGNOSIS — I69969 Other paralytic syndrome following unspecified cerebrovascular disease affecting unspecified side: Secondary | ICD-10-CM | POA: Diagnosis not present

## 2012-03-21 DIAGNOSIS — R1310 Dysphagia, unspecified: Secondary | ICD-10-CM | POA: Diagnosis not present

## 2012-03-21 DIAGNOSIS — E46 Unspecified protein-calorie malnutrition: Secondary | ICD-10-CM | POA: Diagnosis not present

## 2012-03-21 DIAGNOSIS — I69969 Other paralytic syndrome following unspecified cerebrovascular disease affecting unspecified side: Secondary | ICD-10-CM | POA: Diagnosis not present

## 2012-03-21 DIAGNOSIS — M6281 Muscle weakness (generalized): Secondary | ICD-10-CM | POA: Diagnosis not present

## 2012-03-21 DIAGNOSIS — I69922 Dysarthria following unspecified cerebrovascular disease: Secondary | ICD-10-CM | POA: Diagnosis not present

## 2012-03-21 DIAGNOSIS — M6250 Muscle wasting and atrophy, not elsewhere classified, unspecified site: Secondary | ICD-10-CM | POA: Diagnosis not present

## 2012-03-22 DIAGNOSIS — M6281 Muscle weakness (generalized): Secondary | ICD-10-CM | POA: Diagnosis not present

## 2012-03-22 DIAGNOSIS — E46 Unspecified protein-calorie malnutrition: Secondary | ICD-10-CM | POA: Diagnosis not present

## 2012-03-22 DIAGNOSIS — M6250 Muscle wasting and atrophy, not elsewhere classified, unspecified site: Secondary | ICD-10-CM | POA: Diagnosis not present

## 2012-03-22 DIAGNOSIS — I69969 Other paralytic syndrome following unspecified cerebrovascular disease affecting unspecified side: Secondary | ICD-10-CM | POA: Diagnosis not present

## 2012-03-22 DIAGNOSIS — R1310 Dysphagia, unspecified: Secondary | ICD-10-CM | POA: Diagnosis not present

## 2012-03-22 DIAGNOSIS — I69922 Dysarthria following unspecified cerebrovascular disease: Secondary | ICD-10-CM | POA: Diagnosis not present

## 2012-03-23 DIAGNOSIS — R1310 Dysphagia, unspecified: Secondary | ICD-10-CM | POA: Diagnosis not present

## 2012-03-23 DIAGNOSIS — M6250 Muscle wasting and atrophy, not elsewhere classified, unspecified site: Secondary | ICD-10-CM | POA: Diagnosis not present

## 2012-03-23 DIAGNOSIS — I69969 Other paralytic syndrome following unspecified cerebrovascular disease affecting unspecified side: Secondary | ICD-10-CM | POA: Diagnosis not present

## 2012-03-23 DIAGNOSIS — M6281 Muscle weakness (generalized): Secondary | ICD-10-CM | POA: Diagnosis not present

## 2012-03-23 DIAGNOSIS — I69922 Dysarthria following unspecified cerebrovascular disease: Secondary | ICD-10-CM | POA: Diagnosis not present

## 2012-03-23 DIAGNOSIS — E46 Unspecified protein-calorie malnutrition: Secondary | ICD-10-CM | POA: Diagnosis not present

## 2012-03-24 DIAGNOSIS — E46 Unspecified protein-calorie malnutrition: Secondary | ICD-10-CM | POA: Diagnosis not present

## 2012-03-24 DIAGNOSIS — M6250 Muscle wasting and atrophy, not elsewhere classified, unspecified site: Secondary | ICD-10-CM | POA: Diagnosis not present

## 2012-03-24 DIAGNOSIS — I69922 Dysarthria following unspecified cerebrovascular disease: Secondary | ICD-10-CM | POA: Diagnosis not present

## 2012-03-24 DIAGNOSIS — R1310 Dysphagia, unspecified: Secondary | ICD-10-CM | POA: Diagnosis not present

## 2012-03-24 DIAGNOSIS — M6281 Muscle weakness (generalized): Secondary | ICD-10-CM | POA: Diagnosis not present

## 2012-03-24 DIAGNOSIS — I69969 Other paralytic syndrome following unspecified cerebrovascular disease affecting unspecified side: Secondary | ICD-10-CM | POA: Diagnosis not present

## 2012-03-25 DIAGNOSIS — Z431 Encounter for attention to gastrostomy: Secondary | ICD-10-CM | POA: Diagnosis not present

## 2012-03-25 DIAGNOSIS — G835 Locked-in state: Secondary | ICD-10-CM | POA: Diagnosis not present

## 2012-03-25 DIAGNOSIS — L03039 Cellulitis of unspecified toe: Secondary | ICD-10-CM | POA: Diagnosis not present

## 2012-03-25 DIAGNOSIS — L02619 Cutaneous abscess of unspecified foot: Secondary | ICD-10-CM | POA: Diagnosis not present

## 2012-03-25 DIAGNOSIS — R1310 Dysphagia, unspecified: Secondary | ICD-10-CM | POA: Diagnosis not present

## 2012-03-25 DIAGNOSIS — M6281 Muscle weakness (generalized): Secondary | ICD-10-CM | POA: Diagnosis not present

## 2012-03-25 DIAGNOSIS — L600 Ingrowing nail: Secondary | ICD-10-CM | POA: Diagnosis not present

## 2012-03-25 DIAGNOSIS — J9610 Chronic respiratory failure, unspecified whether with hypoxia or hypercapnia: Secondary | ICD-10-CM | POA: Diagnosis not present

## 2012-03-25 DIAGNOSIS — I69969 Other paralytic syndrome following unspecified cerebrovascular disease affecting unspecified side: Secondary | ICD-10-CM | POA: Diagnosis not present

## 2012-03-25 DIAGNOSIS — R21 Rash and other nonspecific skin eruption: Secondary | ICD-10-CM | POA: Diagnosis not present

## 2012-03-25 DIAGNOSIS — M6250 Muscle wasting and atrophy, not elsewhere classified, unspecified site: Secondary | ICD-10-CM | POA: Diagnosis not present

## 2012-03-25 DIAGNOSIS — I69922 Dysarthria following unspecified cerebrovascular disease: Secondary | ICD-10-CM | POA: Diagnosis not present

## 2012-03-25 DIAGNOSIS — I10 Essential (primary) hypertension: Secondary | ICD-10-CM | POA: Diagnosis not present

## 2012-03-26 DIAGNOSIS — L600 Ingrowing nail: Secondary | ICD-10-CM | POA: Diagnosis not present

## 2012-03-26 DIAGNOSIS — L03039 Cellulitis of unspecified toe: Secondary | ICD-10-CM | POA: Diagnosis not present

## 2012-03-26 DIAGNOSIS — L02619 Cutaneous abscess of unspecified foot: Secondary | ICD-10-CM | POA: Diagnosis not present

## 2012-04-13 DIAGNOSIS — R21 Rash and other nonspecific skin eruption: Secondary | ICD-10-CM | POA: Diagnosis not present

## 2012-04-24 DIAGNOSIS — L03039 Cellulitis of unspecified toe: Secondary | ICD-10-CM | POA: Diagnosis not present

## 2012-05-08 DIAGNOSIS — L03039 Cellulitis of unspecified toe: Secondary | ICD-10-CM | POA: Diagnosis not present

## 2012-05-26 DIAGNOSIS — M6281 Muscle weakness (generalized): Secondary | ICD-10-CM | POA: Diagnosis not present

## 2012-05-26 DIAGNOSIS — I69921 Dysphasia following unspecified cerebrovascular disease: Secondary | ICD-10-CM | POA: Diagnosis not present

## 2012-05-26 DIAGNOSIS — Z9981 Dependence on supplemental oxygen: Secondary | ICD-10-CM | POA: Diagnosis not present

## 2012-05-26 DIAGNOSIS — I69928 Other speech and language deficits following unspecified cerebrovascular disease: Secondary | ICD-10-CM | POA: Diagnosis not present

## 2012-05-26 DIAGNOSIS — Z431 Encounter for attention to gastrostomy: Secondary | ICD-10-CM | POA: Diagnosis not present

## 2012-05-26 DIAGNOSIS — I10 Essential (primary) hypertension: Secondary | ICD-10-CM | POA: Diagnosis not present

## 2012-05-26 DIAGNOSIS — E785 Hyperlipidemia, unspecified: Secondary | ICD-10-CM | POA: Diagnosis not present

## 2012-05-26 DIAGNOSIS — I69959 Hemiplegia and hemiparesis following unspecified cerebrovascular disease affecting unspecified side: Secondary | ICD-10-CM | POA: Diagnosis not present

## 2012-05-28 DIAGNOSIS — I69959 Hemiplegia and hemiparesis following unspecified cerebrovascular disease affecting unspecified side: Secondary | ICD-10-CM | POA: Diagnosis not present

## 2012-05-28 DIAGNOSIS — I69928 Other speech and language deficits following unspecified cerebrovascular disease: Secondary | ICD-10-CM | POA: Diagnosis not present

## 2012-05-28 DIAGNOSIS — E785 Hyperlipidemia, unspecified: Secondary | ICD-10-CM | POA: Diagnosis not present

## 2012-05-28 DIAGNOSIS — I69921 Dysphasia following unspecified cerebrovascular disease: Secondary | ICD-10-CM | POA: Diagnosis not present

## 2012-05-28 DIAGNOSIS — I10 Essential (primary) hypertension: Secondary | ICD-10-CM | POA: Diagnosis not present

## 2012-05-28 DIAGNOSIS — M6281 Muscle weakness (generalized): Secondary | ICD-10-CM | POA: Diagnosis not present

## 2012-05-29 DIAGNOSIS — I69928 Other speech and language deficits following unspecified cerebrovascular disease: Secondary | ICD-10-CM | POA: Diagnosis not present

## 2012-05-29 DIAGNOSIS — I69959 Hemiplegia and hemiparesis following unspecified cerebrovascular disease affecting unspecified side: Secondary | ICD-10-CM | POA: Diagnosis not present

## 2012-05-29 DIAGNOSIS — E785 Hyperlipidemia, unspecified: Secondary | ICD-10-CM | POA: Diagnosis not present

## 2012-05-29 DIAGNOSIS — I10 Essential (primary) hypertension: Secondary | ICD-10-CM | POA: Diagnosis not present

## 2012-05-29 DIAGNOSIS — I69921 Dysphasia following unspecified cerebrovascular disease: Secondary | ICD-10-CM | POA: Diagnosis not present

## 2012-05-29 DIAGNOSIS — M6281 Muscle weakness (generalized): Secondary | ICD-10-CM | POA: Diagnosis not present

## 2012-05-30 DIAGNOSIS — I69921 Dysphasia following unspecified cerebrovascular disease: Secondary | ICD-10-CM | POA: Diagnosis not present

## 2012-05-30 DIAGNOSIS — I69959 Hemiplegia and hemiparesis following unspecified cerebrovascular disease affecting unspecified side: Secondary | ICD-10-CM | POA: Diagnosis not present

## 2012-05-30 DIAGNOSIS — E785 Hyperlipidemia, unspecified: Secondary | ICD-10-CM | POA: Diagnosis not present

## 2012-05-30 DIAGNOSIS — I10 Essential (primary) hypertension: Secondary | ICD-10-CM | POA: Diagnosis not present

## 2012-05-30 DIAGNOSIS — M6281 Muscle weakness (generalized): Secondary | ICD-10-CM | POA: Diagnosis not present

## 2012-05-30 DIAGNOSIS — I69928 Other speech and language deficits following unspecified cerebrovascular disease: Secondary | ICD-10-CM | POA: Diagnosis not present

## 2012-05-31 DIAGNOSIS — E785 Hyperlipidemia, unspecified: Secondary | ICD-10-CM | POA: Diagnosis not present

## 2012-05-31 DIAGNOSIS — I69928 Other speech and language deficits following unspecified cerebrovascular disease: Secondary | ICD-10-CM | POA: Diagnosis not present

## 2012-05-31 DIAGNOSIS — I69921 Dysphasia following unspecified cerebrovascular disease: Secondary | ICD-10-CM | POA: Diagnosis not present

## 2012-05-31 DIAGNOSIS — I69959 Hemiplegia and hemiparesis following unspecified cerebrovascular disease affecting unspecified side: Secondary | ICD-10-CM | POA: Diagnosis not present

## 2012-05-31 DIAGNOSIS — I10 Essential (primary) hypertension: Secondary | ICD-10-CM | POA: Diagnosis not present

## 2012-05-31 DIAGNOSIS — M6281 Muscle weakness (generalized): Secondary | ICD-10-CM | POA: Diagnosis not present

## 2012-06-01 DIAGNOSIS — M6281 Muscle weakness (generalized): Secondary | ICD-10-CM | POA: Diagnosis not present

## 2012-06-01 DIAGNOSIS — I69921 Dysphasia following unspecified cerebrovascular disease: Secondary | ICD-10-CM | POA: Diagnosis not present

## 2012-06-01 DIAGNOSIS — E785 Hyperlipidemia, unspecified: Secondary | ICD-10-CM | POA: Diagnosis not present

## 2012-06-01 DIAGNOSIS — I69959 Hemiplegia and hemiparesis following unspecified cerebrovascular disease affecting unspecified side: Secondary | ICD-10-CM | POA: Diagnosis not present

## 2012-06-01 DIAGNOSIS — I10 Essential (primary) hypertension: Secondary | ICD-10-CM | POA: Diagnosis not present

## 2012-06-01 DIAGNOSIS — I69928 Other speech and language deficits following unspecified cerebrovascular disease: Secondary | ICD-10-CM | POA: Diagnosis not present

## 2012-06-04 DIAGNOSIS — M6281 Muscle weakness (generalized): Secondary | ICD-10-CM | POA: Diagnosis not present

## 2012-06-04 DIAGNOSIS — I69959 Hemiplegia and hemiparesis following unspecified cerebrovascular disease affecting unspecified side: Secondary | ICD-10-CM | POA: Diagnosis not present

## 2012-06-04 DIAGNOSIS — E785 Hyperlipidemia, unspecified: Secondary | ICD-10-CM | POA: Diagnosis not present

## 2012-06-04 DIAGNOSIS — I10 Essential (primary) hypertension: Secondary | ICD-10-CM | POA: Diagnosis not present

## 2012-06-04 DIAGNOSIS — I69921 Dysphasia following unspecified cerebrovascular disease: Secondary | ICD-10-CM | POA: Diagnosis not present

## 2012-06-04 DIAGNOSIS — I69928 Other speech and language deficits following unspecified cerebrovascular disease: Secondary | ICD-10-CM | POA: Diagnosis not present

## 2012-06-05 DIAGNOSIS — I69921 Dysphasia following unspecified cerebrovascular disease: Secondary | ICD-10-CM | POA: Diagnosis not present

## 2012-06-05 DIAGNOSIS — I69928 Other speech and language deficits following unspecified cerebrovascular disease: Secondary | ICD-10-CM | POA: Diagnosis not present

## 2012-06-05 DIAGNOSIS — E785 Hyperlipidemia, unspecified: Secondary | ICD-10-CM | POA: Diagnosis not present

## 2012-06-05 DIAGNOSIS — I10 Essential (primary) hypertension: Secondary | ICD-10-CM | POA: Diagnosis not present

## 2012-06-05 DIAGNOSIS — I69959 Hemiplegia and hemiparesis following unspecified cerebrovascular disease affecting unspecified side: Secondary | ICD-10-CM | POA: Diagnosis not present

## 2012-06-05 DIAGNOSIS — M6281 Muscle weakness (generalized): Secondary | ICD-10-CM | POA: Diagnosis not present

## 2012-06-09 DIAGNOSIS — I69928 Other speech and language deficits following unspecified cerebrovascular disease: Secondary | ICD-10-CM | POA: Diagnosis not present

## 2012-06-09 DIAGNOSIS — I69921 Dysphasia following unspecified cerebrovascular disease: Secondary | ICD-10-CM | POA: Diagnosis not present

## 2012-06-09 DIAGNOSIS — E785 Hyperlipidemia, unspecified: Secondary | ICD-10-CM | POA: Diagnosis not present

## 2012-06-09 DIAGNOSIS — I10 Essential (primary) hypertension: Secondary | ICD-10-CM | POA: Diagnosis not present

## 2012-06-09 DIAGNOSIS — M6281 Muscle weakness (generalized): Secondary | ICD-10-CM | POA: Diagnosis not present

## 2012-06-09 DIAGNOSIS — I69959 Hemiplegia and hemiparesis following unspecified cerebrovascular disease affecting unspecified side: Secondary | ICD-10-CM | POA: Diagnosis not present

## 2012-06-11 DIAGNOSIS — E785 Hyperlipidemia, unspecified: Secondary | ICD-10-CM | POA: Diagnosis not present

## 2012-06-11 DIAGNOSIS — I10 Essential (primary) hypertension: Secondary | ICD-10-CM | POA: Diagnosis not present

## 2012-06-11 DIAGNOSIS — I69928 Other speech and language deficits following unspecified cerebrovascular disease: Secondary | ICD-10-CM | POA: Diagnosis not present

## 2012-06-11 DIAGNOSIS — I69959 Hemiplegia and hemiparesis following unspecified cerebrovascular disease affecting unspecified side: Secondary | ICD-10-CM | POA: Diagnosis not present

## 2012-06-11 DIAGNOSIS — M6281 Muscle weakness (generalized): Secondary | ICD-10-CM | POA: Diagnosis not present

## 2012-06-11 DIAGNOSIS — I69921 Dysphasia following unspecified cerebrovascular disease: Secondary | ICD-10-CM | POA: Diagnosis not present

## 2012-06-13 DIAGNOSIS — I69921 Dysphasia following unspecified cerebrovascular disease: Secondary | ICD-10-CM | POA: Diagnosis not present

## 2012-06-13 DIAGNOSIS — I10 Essential (primary) hypertension: Secondary | ICD-10-CM | POA: Diagnosis not present

## 2012-06-13 DIAGNOSIS — I69928 Other speech and language deficits following unspecified cerebrovascular disease: Secondary | ICD-10-CM | POA: Diagnosis not present

## 2012-06-13 DIAGNOSIS — I69959 Hemiplegia and hemiparesis following unspecified cerebrovascular disease affecting unspecified side: Secondary | ICD-10-CM | POA: Diagnosis not present

## 2012-06-13 DIAGNOSIS — E785 Hyperlipidemia, unspecified: Secondary | ICD-10-CM | POA: Diagnosis not present

## 2012-06-13 DIAGNOSIS — M6281 Muscle weakness (generalized): Secondary | ICD-10-CM | POA: Diagnosis not present

## 2012-06-14 DIAGNOSIS — M6281 Muscle weakness (generalized): Secondary | ICD-10-CM | POA: Diagnosis not present

## 2012-06-14 DIAGNOSIS — I69928 Other speech and language deficits following unspecified cerebrovascular disease: Secondary | ICD-10-CM | POA: Diagnosis not present

## 2012-06-14 DIAGNOSIS — I69921 Dysphasia following unspecified cerebrovascular disease: Secondary | ICD-10-CM | POA: Diagnosis not present

## 2012-06-14 DIAGNOSIS — I10 Essential (primary) hypertension: Secondary | ICD-10-CM | POA: Diagnosis not present

## 2012-06-14 DIAGNOSIS — I69959 Hemiplegia and hemiparesis following unspecified cerebrovascular disease affecting unspecified side: Secondary | ICD-10-CM | POA: Diagnosis not present

## 2012-06-14 DIAGNOSIS — E785 Hyperlipidemia, unspecified: Secondary | ICD-10-CM | POA: Diagnosis not present

## 2012-06-15 DIAGNOSIS — I69921 Dysphasia following unspecified cerebrovascular disease: Secondary | ICD-10-CM | POA: Diagnosis not present

## 2012-06-15 DIAGNOSIS — I10 Essential (primary) hypertension: Secondary | ICD-10-CM | POA: Diagnosis not present

## 2012-06-15 DIAGNOSIS — I69959 Hemiplegia and hemiparesis following unspecified cerebrovascular disease affecting unspecified side: Secondary | ICD-10-CM | POA: Diagnosis not present

## 2012-06-15 DIAGNOSIS — E785 Hyperlipidemia, unspecified: Secondary | ICD-10-CM | POA: Diagnosis not present

## 2012-06-15 DIAGNOSIS — I69928 Other speech and language deficits following unspecified cerebrovascular disease: Secondary | ICD-10-CM | POA: Diagnosis not present

## 2012-06-15 DIAGNOSIS — M6281 Muscle weakness (generalized): Secondary | ICD-10-CM | POA: Diagnosis not present

## 2012-06-18 DIAGNOSIS — E785 Hyperlipidemia, unspecified: Secondary | ICD-10-CM | POA: Diagnosis not present

## 2012-06-18 DIAGNOSIS — I69959 Hemiplegia and hemiparesis following unspecified cerebrovascular disease affecting unspecified side: Secondary | ICD-10-CM | POA: Diagnosis not present

## 2012-06-18 DIAGNOSIS — I69921 Dysphasia following unspecified cerebrovascular disease: Secondary | ICD-10-CM | POA: Diagnosis not present

## 2012-06-18 DIAGNOSIS — M6281 Muscle weakness (generalized): Secondary | ICD-10-CM | POA: Diagnosis not present

## 2012-06-18 DIAGNOSIS — I10 Essential (primary) hypertension: Secondary | ICD-10-CM | POA: Diagnosis not present

## 2012-06-18 DIAGNOSIS — I69928 Other speech and language deficits following unspecified cerebrovascular disease: Secondary | ICD-10-CM | POA: Diagnosis not present

## 2012-06-19 DIAGNOSIS — E785 Hyperlipidemia, unspecified: Secondary | ICD-10-CM | POA: Diagnosis not present

## 2012-06-19 DIAGNOSIS — I69959 Hemiplegia and hemiparesis following unspecified cerebrovascular disease affecting unspecified side: Secondary | ICD-10-CM | POA: Diagnosis not present

## 2012-06-19 DIAGNOSIS — I69921 Dysphasia following unspecified cerebrovascular disease: Secondary | ICD-10-CM | POA: Diagnosis not present

## 2012-06-19 DIAGNOSIS — M6281 Muscle weakness (generalized): Secondary | ICD-10-CM | POA: Diagnosis not present

## 2012-06-19 DIAGNOSIS — I10 Essential (primary) hypertension: Secondary | ICD-10-CM | POA: Diagnosis not present

## 2012-06-19 DIAGNOSIS — I69928 Other speech and language deficits following unspecified cerebrovascular disease: Secondary | ICD-10-CM | POA: Diagnosis not present

## 2012-06-20 DIAGNOSIS — I10 Essential (primary) hypertension: Secondary | ICD-10-CM | POA: Diagnosis not present

## 2012-06-20 DIAGNOSIS — I69921 Dysphasia following unspecified cerebrovascular disease: Secondary | ICD-10-CM | POA: Diagnosis not present

## 2012-06-20 DIAGNOSIS — M6281 Muscle weakness (generalized): Secondary | ICD-10-CM | POA: Diagnosis not present

## 2012-06-20 DIAGNOSIS — I69959 Hemiplegia and hemiparesis following unspecified cerebrovascular disease affecting unspecified side: Secondary | ICD-10-CM | POA: Diagnosis not present

## 2012-06-20 DIAGNOSIS — E785 Hyperlipidemia, unspecified: Secondary | ICD-10-CM | POA: Diagnosis not present

## 2012-06-20 DIAGNOSIS — I69928 Other speech and language deficits following unspecified cerebrovascular disease: Secondary | ICD-10-CM | POA: Diagnosis not present

## 2012-06-21 DIAGNOSIS — I69959 Hemiplegia and hemiparesis following unspecified cerebrovascular disease affecting unspecified side: Secondary | ICD-10-CM | POA: Diagnosis not present

## 2012-06-21 DIAGNOSIS — I10 Essential (primary) hypertension: Secondary | ICD-10-CM | POA: Diagnosis not present

## 2012-06-21 DIAGNOSIS — I69928 Other speech and language deficits following unspecified cerebrovascular disease: Secondary | ICD-10-CM | POA: Diagnosis not present

## 2012-06-21 DIAGNOSIS — I69921 Dysphasia following unspecified cerebrovascular disease: Secondary | ICD-10-CM | POA: Diagnosis not present

## 2012-06-21 DIAGNOSIS — E785 Hyperlipidemia, unspecified: Secondary | ICD-10-CM | POA: Diagnosis not present

## 2012-06-21 DIAGNOSIS — M6281 Muscle weakness (generalized): Secondary | ICD-10-CM | POA: Diagnosis not present

## 2012-06-22 DIAGNOSIS — I69928 Other speech and language deficits following unspecified cerebrovascular disease: Secondary | ICD-10-CM | POA: Diagnosis not present

## 2012-06-22 DIAGNOSIS — M6281 Muscle weakness (generalized): Secondary | ICD-10-CM | POA: Diagnosis not present

## 2012-06-22 DIAGNOSIS — I69921 Dysphasia following unspecified cerebrovascular disease: Secondary | ICD-10-CM | POA: Diagnosis not present

## 2012-06-22 DIAGNOSIS — I10 Essential (primary) hypertension: Secondary | ICD-10-CM | POA: Diagnosis not present

## 2012-06-22 DIAGNOSIS — E785 Hyperlipidemia, unspecified: Secondary | ICD-10-CM | POA: Diagnosis not present

## 2012-06-22 DIAGNOSIS — I69959 Hemiplegia and hemiparesis following unspecified cerebrovascular disease affecting unspecified side: Secondary | ICD-10-CM | POA: Diagnosis not present

## 2012-06-25 DIAGNOSIS — I69921 Dysphasia following unspecified cerebrovascular disease: Secondary | ICD-10-CM | POA: Diagnosis not present

## 2012-06-25 DIAGNOSIS — I10 Essential (primary) hypertension: Secondary | ICD-10-CM | POA: Diagnosis not present

## 2012-06-25 DIAGNOSIS — M6281 Muscle weakness (generalized): Secondary | ICD-10-CM | POA: Diagnosis not present

## 2012-06-25 DIAGNOSIS — E785 Hyperlipidemia, unspecified: Secondary | ICD-10-CM | POA: Diagnosis not present

## 2012-06-25 DIAGNOSIS — I69959 Hemiplegia and hemiparesis following unspecified cerebrovascular disease affecting unspecified side: Secondary | ICD-10-CM | POA: Diagnosis not present

## 2012-06-25 DIAGNOSIS — I69928 Other speech and language deficits following unspecified cerebrovascular disease: Secondary | ICD-10-CM | POA: Diagnosis not present

## 2012-06-26 DIAGNOSIS — E782 Mixed hyperlipidemia: Secondary | ICD-10-CM | POA: Diagnosis not present

## 2012-06-26 DIAGNOSIS — R059 Cough, unspecified: Secondary | ICD-10-CM | POA: Diagnosis not present

## 2012-06-26 DIAGNOSIS — Z125 Encounter for screening for malignant neoplasm of prostate: Secondary | ICD-10-CM | POA: Diagnosis not present

## 2012-06-27 DIAGNOSIS — I10 Essential (primary) hypertension: Secondary | ICD-10-CM | POA: Diagnosis not present

## 2012-06-27 DIAGNOSIS — E785 Hyperlipidemia, unspecified: Secondary | ICD-10-CM | POA: Diagnosis not present

## 2012-06-27 DIAGNOSIS — M6281 Muscle weakness (generalized): Secondary | ICD-10-CM | POA: Diagnosis not present

## 2012-06-27 DIAGNOSIS — I69928 Other speech and language deficits following unspecified cerebrovascular disease: Secondary | ICD-10-CM | POA: Diagnosis not present

## 2012-06-27 DIAGNOSIS — I69921 Dysphasia following unspecified cerebrovascular disease: Secondary | ICD-10-CM | POA: Diagnosis not present

## 2012-06-27 DIAGNOSIS — I69959 Hemiplegia and hemiparesis following unspecified cerebrovascular disease affecting unspecified side: Secondary | ICD-10-CM | POA: Diagnosis not present

## 2012-06-28 DIAGNOSIS — I10 Essential (primary) hypertension: Secondary | ICD-10-CM | POA: Diagnosis not present

## 2012-06-28 DIAGNOSIS — I69959 Hemiplegia and hemiparesis following unspecified cerebrovascular disease affecting unspecified side: Secondary | ICD-10-CM | POA: Diagnosis not present

## 2012-06-28 DIAGNOSIS — I69928 Other speech and language deficits following unspecified cerebrovascular disease: Secondary | ICD-10-CM | POA: Diagnosis not present

## 2012-06-28 DIAGNOSIS — E785 Hyperlipidemia, unspecified: Secondary | ICD-10-CM | POA: Diagnosis not present

## 2012-06-28 DIAGNOSIS — I69921 Dysphasia following unspecified cerebrovascular disease: Secondary | ICD-10-CM | POA: Diagnosis not present

## 2012-06-28 DIAGNOSIS — M6281 Muscle weakness (generalized): Secondary | ICD-10-CM | POA: Diagnosis not present

## 2012-06-29 DIAGNOSIS — M6281 Muscle weakness (generalized): Secondary | ICD-10-CM | POA: Diagnosis not present

## 2012-06-29 DIAGNOSIS — I69928 Other speech and language deficits following unspecified cerebrovascular disease: Secondary | ICD-10-CM | POA: Diagnosis not present

## 2012-06-29 DIAGNOSIS — E785 Hyperlipidemia, unspecified: Secondary | ICD-10-CM | POA: Diagnosis not present

## 2012-06-29 DIAGNOSIS — I69921 Dysphasia following unspecified cerebrovascular disease: Secondary | ICD-10-CM | POA: Diagnosis not present

## 2012-06-29 DIAGNOSIS — I69959 Hemiplegia and hemiparesis following unspecified cerebrovascular disease affecting unspecified side: Secondary | ICD-10-CM | POA: Diagnosis not present

## 2012-06-29 DIAGNOSIS — I10 Essential (primary) hypertension: Secondary | ICD-10-CM | POA: Diagnosis not present

## 2012-07-02 DIAGNOSIS — I10 Essential (primary) hypertension: Secondary | ICD-10-CM | POA: Diagnosis not present

## 2012-07-02 DIAGNOSIS — I69959 Hemiplegia and hemiparesis following unspecified cerebrovascular disease affecting unspecified side: Secondary | ICD-10-CM | POA: Diagnosis not present

## 2012-07-02 DIAGNOSIS — I69928 Other speech and language deficits following unspecified cerebrovascular disease: Secondary | ICD-10-CM | POA: Diagnosis not present

## 2012-07-02 DIAGNOSIS — E785 Hyperlipidemia, unspecified: Secondary | ICD-10-CM | POA: Diagnosis not present

## 2012-07-02 DIAGNOSIS — I69921 Dysphasia following unspecified cerebrovascular disease: Secondary | ICD-10-CM | POA: Diagnosis not present

## 2012-07-02 DIAGNOSIS — M6281 Muscle weakness (generalized): Secondary | ICD-10-CM | POA: Diagnosis not present

## 2012-07-03 DIAGNOSIS — I69959 Hemiplegia and hemiparesis following unspecified cerebrovascular disease affecting unspecified side: Secondary | ICD-10-CM | POA: Diagnosis not present

## 2012-07-03 DIAGNOSIS — M6281 Muscle weakness (generalized): Secondary | ICD-10-CM | POA: Diagnosis not present

## 2012-07-03 DIAGNOSIS — E785 Hyperlipidemia, unspecified: Secondary | ICD-10-CM | POA: Diagnosis not present

## 2012-07-03 DIAGNOSIS — I10 Essential (primary) hypertension: Secondary | ICD-10-CM | POA: Diagnosis not present

## 2012-07-03 DIAGNOSIS — I69921 Dysphasia following unspecified cerebrovascular disease: Secondary | ICD-10-CM | POA: Diagnosis not present

## 2012-07-03 DIAGNOSIS — I69928 Other speech and language deficits following unspecified cerebrovascular disease: Secondary | ICD-10-CM | POA: Diagnosis not present

## 2012-07-05 DIAGNOSIS — I69921 Dysphasia following unspecified cerebrovascular disease: Secondary | ICD-10-CM | POA: Diagnosis not present

## 2012-07-05 DIAGNOSIS — I69928 Other speech and language deficits following unspecified cerebrovascular disease: Secondary | ICD-10-CM | POA: Diagnosis not present

## 2012-07-05 DIAGNOSIS — M6281 Muscle weakness (generalized): Secondary | ICD-10-CM | POA: Diagnosis not present

## 2012-07-05 DIAGNOSIS — I10 Essential (primary) hypertension: Secondary | ICD-10-CM | POA: Diagnosis not present

## 2012-07-05 DIAGNOSIS — E785 Hyperlipidemia, unspecified: Secondary | ICD-10-CM | POA: Diagnosis not present

## 2012-07-05 DIAGNOSIS — I69959 Hemiplegia and hemiparesis following unspecified cerebrovascular disease affecting unspecified side: Secondary | ICD-10-CM | POA: Diagnosis not present

## 2012-07-09 DIAGNOSIS — I10 Essential (primary) hypertension: Secondary | ICD-10-CM | POA: Diagnosis not present

## 2012-07-09 DIAGNOSIS — E785 Hyperlipidemia, unspecified: Secondary | ICD-10-CM | POA: Diagnosis not present

## 2012-07-09 DIAGNOSIS — M6281 Muscle weakness (generalized): Secondary | ICD-10-CM | POA: Diagnosis not present

## 2012-07-09 DIAGNOSIS — I69928 Other speech and language deficits following unspecified cerebrovascular disease: Secondary | ICD-10-CM | POA: Diagnosis not present

## 2012-07-09 DIAGNOSIS — I69921 Dysphasia following unspecified cerebrovascular disease: Secondary | ICD-10-CM | POA: Diagnosis not present

## 2012-07-09 DIAGNOSIS — I69959 Hemiplegia and hemiparesis following unspecified cerebrovascular disease affecting unspecified side: Secondary | ICD-10-CM | POA: Diagnosis not present

## 2012-07-10 DIAGNOSIS — I10 Essential (primary) hypertension: Secondary | ICD-10-CM | POA: Diagnosis not present

## 2012-07-10 DIAGNOSIS — M6281 Muscle weakness (generalized): Secondary | ICD-10-CM | POA: Diagnosis not present

## 2012-07-10 DIAGNOSIS — I69921 Dysphasia following unspecified cerebrovascular disease: Secondary | ICD-10-CM | POA: Diagnosis not present

## 2012-07-10 DIAGNOSIS — I69959 Hemiplegia and hemiparesis following unspecified cerebrovascular disease affecting unspecified side: Secondary | ICD-10-CM | POA: Diagnosis not present

## 2012-07-10 DIAGNOSIS — I69928 Other speech and language deficits following unspecified cerebrovascular disease: Secondary | ICD-10-CM | POA: Diagnosis not present

## 2012-07-10 DIAGNOSIS — E785 Hyperlipidemia, unspecified: Secondary | ICD-10-CM | POA: Diagnosis not present

## 2012-07-12 DIAGNOSIS — I69928 Other speech and language deficits following unspecified cerebrovascular disease: Secondary | ICD-10-CM | POA: Diagnosis not present

## 2012-07-12 DIAGNOSIS — I69959 Hemiplegia and hemiparesis following unspecified cerebrovascular disease affecting unspecified side: Secondary | ICD-10-CM | POA: Diagnosis not present

## 2012-07-12 DIAGNOSIS — M6281 Muscle weakness (generalized): Secondary | ICD-10-CM | POA: Diagnosis not present

## 2012-07-12 DIAGNOSIS — I10 Essential (primary) hypertension: Secondary | ICD-10-CM | POA: Diagnosis not present

## 2012-07-12 DIAGNOSIS — I69921 Dysphasia following unspecified cerebrovascular disease: Secondary | ICD-10-CM | POA: Diagnosis not present

## 2012-07-12 DIAGNOSIS — E785 Hyperlipidemia, unspecified: Secondary | ICD-10-CM | POA: Diagnosis not present

## 2012-07-13 DIAGNOSIS — E785 Hyperlipidemia, unspecified: Secondary | ICD-10-CM | POA: Diagnosis not present

## 2012-07-13 DIAGNOSIS — I10 Essential (primary) hypertension: Secondary | ICD-10-CM | POA: Diagnosis not present

## 2012-07-13 DIAGNOSIS — I69959 Hemiplegia and hemiparesis following unspecified cerebrovascular disease affecting unspecified side: Secondary | ICD-10-CM | POA: Diagnosis not present

## 2012-07-13 DIAGNOSIS — I69928 Other speech and language deficits following unspecified cerebrovascular disease: Secondary | ICD-10-CM | POA: Diagnosis not present

## 2012-07-13 DIAGNOSIS — I69921 Dysphasia following unspecified cerebrovascular disease: Secondary | ICD-10-CM | POA: Diagnosis not present

## 2012-07-13 DIAGNOSIS — M6281 Muscle weakness (generalized): Secondary | ICD-10-CM | POA: Diagnosis not present

## 2012-07-16 DIAGNOSIS — M6281 Muscle weakness (generalized): Secondary | ICD-10-CM | POA: Diagnosis not present

## 2012-07-16 DIAGNOSIS — I69959 Hemiplegia and hemiparesis following unspecified cerebrovascular disease affecting unspecified side: Secondary | ICD-10-CM | POA: Diagnosis not present

## 2012-07-16 DIAGNOSIS — I69928 Other speech and language deficits following unspecified cerebrovascular disease: Secondary | ICD-10-CM | POA: Diagnosis not present

## 2012-07-16 DIAGNOSIS — I10 Essential (primary) hypertension: Secondary | ICD-10-CM | POA: Diagnosis not present

## 2012-07-16 DIAGNOSIS — I69921 Dysphasia following unspecified cerebrovascular disease: Secondary | ICD-10-CM | POA: Diagnosis not present

## 2012-07-16 DIAGNOSIS — E785 Hyperlipidemia, unspecified: Secondary | ICD-10-CM | POA: Diagnosis not present

## 2012-07-17 DIAGNOSIS — I10 Essential (primary) hypertension: Secondary | ICD-10-CM | POA: Diagnosis not present

## 2012-07-17 DIAGNOSIS — M6281 Muscle weakness (generalized): Secondary | ICD-10-CM | POA: Diagnosis not present

## 2012-07-17 DIAGNOSIS — I69959 Hemiplegia and hemiparesis following unspecified cerebrovascular disease affecting unspecified side: Secondary | ICD-10-CM | POA: Diagnosis not present

## 2012-07-17 DIAGNOSIS — E785 Hyperlipidemia, unspecified: Secondary | ICD-10-CM | POA: Diagnosis not present

## 2012-07-17 DIAGNOSIS — I69928 Other speech and language deficits following unspecified cerebrovascular disease: Secondary | ICD-10-CM | POA: Diagnosis not present

## 2012-07-17 DIAGNOSIS — I69921 Dysphasia following unspecified cerebrovascular disease: Secondary | ICD-10-CM | POA: Diagnosis not present

## 2012-07-18 DIAGNOSIS — M6281 Muscle weakness (generalized): Secondary | ICD-10-CM | POA: Diagnosis not present

## 2012-07-18 DIAGNOSIS — I69959 Hemiplegia and hemiparesis following unspecified cerebrovascular disease affecting unspecified side: Secondary | ICD-10-CM | POA: Diagnosis not present

## 2012-07-18 DIAGNOSIS — I69921 Dysphasia following unspecified cerebrovascular disease: Secondary | ICD-10-CM | POA: Diagnosis not present

## 2012-07-18 DIAGNOSIS — E785 Hyperlipidemia, unspecified: Secondary | ICD-10-CM | POA: Diagnosis not present

## 2012-07-18 DIAGNOSIS — I10 Essential (primary) hypertension: Secondary | ICD-10-CM | POA: Diagnosis not present

## 2012-07-18 DIAGNOSIS — I69928 Other speech and language deficits following unspecified cerebrovascular disease: Secondary | ICD-10-CM | POA: Diagnosis not present

## 2012-07-19 DIAGNOSIS — I6990 Unspecified sequelae of unspecified cerebrovascular disease: Secondary | ICD-10-CM | POA: Diagnosis not present

## 2012-07-19 DIAGNOSIS — R040 Epistaxis: Secondary | ICD-10-CM | POA: Diagnosis not present

## 2012-07-20 DIAGNOSIS — E785 Hyperlipidemia, unspecified: Secondary | ICD-10-CM | POA: Diagnosis not present

## 2012-07-20 DIAGNOSIS — M6281 Muscle weakness (generalized): Secondary | ICD-10-CM | POA: Diagnosis not present

## 2012-07-20 DIAGNOSIS — I69928 Other speech and language deficits following unspecified cerebrovascular disease: Secondary | ICD-10-CM | POA: Diagnosis not present

## 2012-07-20 DIAGNOSIS — I69959 Hemiplegia and hemiparesis following unspecified cerebrovascular disease affecting unspecified side: Secondary | ICD-10-CM | POA: Diagnosis not present

## 2012-07-20 DIAGNOSIS — I10 Essential (primary) hypertension: Secondary | ICD-10-CM | POA: Diagnosis not present

## 2012-07-20 DIAGNOSIS — I69921 Dysphasia following unspecified cerebrovascular disease: Secondary | ICD-10-CM | POA: Diagnosis not present

## 2012-07-23 DIAGNOSIS — E785 Hyperlipidemia, unspecified: Secondary | ICD-10-CM | POA: Diagnosis not present

## 2012-07-23 DIAGNOSIS — I69959 Hemiplegia and hemiparesis following unspecified cerebrovascular disease affecting unspecified side: Secondary | ICD-10-CM | POA: Diagnosis not present

## 2012-07-23 DIAGNOSIS — M6281 Muscle weakness (generalized): Secondary | ICD-10-CM | POA: Diagnosis not present

## 2012-07-23 DIAGNOSIS — I69928 Other speech and language deficits following unspecified cerebrovascular disease: Secondary | ICD-10-CM | POA: Diagnosis not present

## 2012-07-23 DIAGNOSIS — I10 Essential (primary) hypertension: Secondary | ICD-10-CM | POA: Diagnosis not present

## 2012-07-23 DIAGNOSIS — I69921 Dysphasia following unspecified cerebrovascular disease: Secondary | ICD-10-CM | POA: Diagnosis not present

## 2012-07-25 DIAGNOSIS — I69921 Dysphasia following unspecified cerebrovascular disease: Secondary | ICD-10-CM | POA: Diagnosis not present

## 2012-07-25 DIAGNOSIS — I10 Essential (primary) hypertension: Secondary | ICD-10-CM | POA: Diagnosis not present

## 2012-07-25 DIAGNOSIS — I69959 Hemiplegia and hemiparesis following unspecified cerebrovascular disease affecting unspecified side: Secondary | ICD-10-CM | POA: Diagnosis not present

## 2012-07-25 DIAGNOSIS — Z9981 Dependence on supplemental oxygen: Secondary | ICD-10-CM | POA: Diagnosis not present

## 2012-07-25 DIAGNOSIS — I69928 Other speech and language deficits following unspecified cerebrovascular disease: Secondary | ICD-10-CM | POA: Diagnosis not present

## 2012-07-25 DIAGNOSIS — M6281 Muscle weakness (generalized): Secondary | ICD-10-CM | POA: Diagnosis not present

## 2012-07-25 DIAGNOSIS — Z431 Encounter for attention to gastrostomy: Secondary | ICD-10-CM | POA: Diagnosis not present

## 2012-07-25 DIAGNOSIS — E785 Hyperlipidemia, unspecified: Secondary | ICD-10-CM | POA: Diagnosis not present

## 2012-07-26 DIAGNOSIS — I69959 Hemiplegia and hemiparesis following unspecified cerebrovascular disease affecting unspecified side: Secondary | ICD-10-CM | POA: Diagnosis not present

## 2012-07-26 DIAGNOSIS — I10 Essential (primary) hypertension: Secondary | ICD-10-CM | POA: Diagnosis not present

## 2012-07-26 DIAGNOSIS — I69921 Dysphasia following unspecified cerebrovascular disease: Secondary | ICD-10-CM | POA: Diagnosis not present

## 2012-07-26 DIAGNOSIS — M6281 Muscle weakness (generalized): Secondary | ICD-10-CM | POA: Diagnosis not present

## 2012-07-26 DIAGNOSIS — I69928 Other speech and language deficits following unspecified cerebrovascular disease: Secondary | ICD-10-CM | POA: Diagnosis not present

## 2012-07-26 DIAGNOSIS — E785 Hyperlipidemia, unspecified: Secondary | ICD-10-CM | POA: Diagnosis not present

## 2012-07-30 DIAGNOSIS — I69959 Hemiplegia and hemiparesis following unspecified cerebrovascular disease affecting unspecified side: Secondary | ICD-10-CM | POA: Diagnosis not present

## 2012-07-30 DIAGNOSIS — M6281 Muscle weakness (generalized): Secondary | ICD-10-CM | POA: Diagnosis not present

## 2012-07-30 DIAGNOSIS — E785 Hyperlipidemia, unspecified: Secondary | ICD-10-CM | POA: Diagnosis not present

## 2012-07-30 DIAGNOSIS — I69928 Other speech and language deficits following unspecified cerebrovascular disease: Secondary | ICD-10-CM | POA: Diagnosis not present

## 2012-07-30 DIAGNOSIS — I10 Essential (primary) hypertension: Secondary | ICD-10-CM | POA: Diagnosis not present

## 2012-07-30 DIAGNOSIS — I69921 Dysphasia following unspecified cerebrovascular disease: Secondary | ICD-10-CM | POA: Diagnosis not present

## 2012-08-02 DIAGNOSIS — M6281 Muscle weakness (generalized): Secondary | ICD-10-CM | POA: Diagnosis not present

## 2012-08-02 DIAGNOSIS — I69928 Other speech and language deficits following unspecified cerebrovascular disease: Secondary | ICD-10-CM | POA: Diagnosis not present

## 2012-08-02 DIAGNOSIS — I69921 Dysphasia following unspecified cerebrovascular disease: Secondary | ICD-10-CM | POA: Diagnosis not present

## 2012-08-02 DIAGNOSIS — I69959 Hemiplegia and hemiparesis following unspecified cerebrovascular disease affecting unspecified side: Secondary | ICD-10-CM | POA: Diagnosis not present

## 2012-08-02 DIAGNOSIS — E785 Hyperlipidemia, unspecified: Secondary | ICD-10-CM | POA: Diagnosis not present

## 2012-08-02 DIAGNOSIS — I10 Essential (primary) hypertension: Secondary | ICD-10-CM | POA: Diagnosis not present

## 2012-08-06 DIAGNOSIS — M6281 Muscle weakness (generalized): Secondary | ICD-10-CM | POA: Diagnosis not present

## 2012-08-06 DIAGNOSIS — I69928 Other speech and language deficits following unspecified cerebrovascular disease: Secondary | ICD-10-CM | POA: Diagnosis not present

## 2012-08-06 DIAGNOSIS — I10 Essential (primary) hypertension: Secondary | ICD-10-CM | POA: Diagnosis not present

## 2012-08-06 DIAGNOSIS — E785 Hyperlipidemia, unspecified: Secondary | ICD-10-CM | POA: Diagnosis not present

## 2012-08-06 DIAGNOSIS — I69921 Dysphasia following unspecified cerebrovascular disease: Secondary | ICD-10-CM | POA: Diagnosis not present

## 2012-08-06 DIAGNOSIS — I69959 Hemiplegia and hemiparesis following unspecified cerebrovascular disease affecting unspecified side: Secondary | ICD-10-CM | POA: Diagnosis not present

## 2012-08-09 DIAGNOSIS — M24819 Other specific joint derangements of unspecified shoulder, not elsewhere classified: Secondary | ICD-10-CM | POA: Diagnosis not present

## 2012-08-10 DIAGNOSIS — M6281 Muscle weakness (generalized): Secondary | ICD-10-CM | POA: Diagnosis not present

## 2012-08-10 DIAGNOSIS — I69921 Dysphasia following unspecified cerebrovascular disease: Secondary | ICD-10-CM | POA: Diagnosis not present

## 2012-08-10 DIAGNOSIS — I69959 Hemiplegia and hemiparesis following unspecified cerebrovascular disease affecting unspecified side: Secondary | ICD-10-CM | POA: Diagnosis not present

## 2012-08-10 DIAGNOSIS — E785 Hyperlipidemia, unspecified: Secondary | ICD-10-CM | POA: Diagnosis not present

## 2012-08-10 DIAGNOSIS — I10 Essential (primary) hypertension: Secondary | ICD-10-CM | POA: Diagnosis not present

## 2012-08-10 DIAGNOSIS — I69928 Other speech and language deficits following unspecified cerebrovascular disease: Secondary | ICD-10-CM | POA: Diagnosis not present

## 2012-08-13 DIAGNOSIS — I69928 Other speech and language deficits following unspecified cerebrovascular disease: Secondary | ICD-10-CM | POA: Diagnosis not present

## 2012-08-13 DIAGNOSIS — I69959 Hemiplegia and hemiparesis following unspecified cerebrovascular disease affecting unspecified side: Secondary | ICD-10-CM | POA: Diagnosis not present

## 2012-08-13 DIAGNOSIS — E785 Hyperlipidemia, unspecified: Secondary | ICD-10-CM | POA: Diagnosis not present

## 2012-08-13 DIAGNOSIS — M6281 Muscle weakness (generalized): Secondary | ICD-10-CM | POA: Diagnosis not present

## 2012-08-13 DIAGNOSIS — I10 Essential (primary) hypertension: Secondary | ICD-10-CM | POA: Diagnosis not present

## 2012-08-13 DIAGNOSIS — I69921 Dysphasia following unspecified cerebrovascular disease: Secondary | ICD-10-CM | POA: Diagnosis not present

## 2012-08-16 DIAGNOSIS — I10 Essential (primary) hypertension: Secondary | ICD-10-CM | POA: Diagnosis not present

## 2012-08-16 DIAGNOSIS — I69928 Other speech and language deficits following unspecified cerebrovascular disease: Secondary | ICD-10-CM | POA: Diagnosis not present

## 2012-08-16 DIAGNOSIS — E785 Hyperlipidemia, unspecified: Secondary | ICD-10-CM | POA: Diagnosis not present

## 2012-08-16 DIAGNOSIS — I69921 Dysphasia following unspecified cerebrovascular disease: Secondary | ICD-10-CM | POA: Diagnosis not present

## 2012-08-16 DIAGNOSIS — M6281 Muscle weakness (generalized): Secondary | ICD-10-CM | POA: Diagnosis not present

## 2012-08-16 DIAGNOSIS — I69959 Hemiplegia and hemiparesis following unspecified cerebrovascular disease affecting unspecified side: Secondary | ICD-10-CM | POA: Diagnosis not present

## 2012-08-20 DIAGNOSIS — M6281 Muscle weakness (generalized): Secondary | ICD-10-CM | POA: Diagnosis not present

## 2012-08-20 DIAGNOSIS — I10 Essential (primary) hypertension: Secondary | ICD-10-CM | POA: Diagnosis not present

## 2012-08-20 DIAGNOSIS — I69959 Hemiplegia and hemiparesis following unspecified cerebrovascular disease affecting unspecified side: Secondary | ICD-10-CM | POA: Diagnosis not present

## 2012-08-20 DIAGNOSIS — E785 Hyperlipidemia, unspecified: Secondary | ICD-10-CM | POA: Diagnosis not present

## 2012-08-20 DIAGNOSIS — I69921 Dysphasia following unspecified cerebrovascular disease: Secondary | ICD-10-CM | POA: Diagnosis not present

## 2012-08-20 DIAGNOSIS — I69928 Other speech and language deficits following unspecified cerebrovascular disease: Secondary | ICD-10-CM | POA: Diagnosis not present

## 2012-08-23 DIAGNOSIS — E785 Hyperlipidemia, unspecified: Secondary | ICD-10-CM | POA: Diagnosis not present

## 2012-08-23 DIAGNOSIS — I69959 Hemiplegia and hemiparesis following unspecified cerebrovascular disease affecting unspecified side: Secondary | ICD-10-CM | POA: Diagnosis not present

## 2012-08-23 DIAGNOSIS — I69921 Dysphasia following unspecified cerebrovascular disease: Secondary | ICD-10-CM | POA: Diagnosis not present

## 2012-08-23 DIAGNOSIS — I10 Essential (primary) hypertension: Secondary | ICD-10-CM | POA: Diagnosis not present

## 2012-08-23 DIAGNOSIS — I69928 Other speech and language deficits following unspecified cerebrovascular disease: Secondary | ICD-10-CM | POA: Diagnosis not present

## 2012-08-23 DIAGNOSIS — M6281 Muscle weakness (generalized): Secondary | ICD-10-CM | POA: Diagnosis not present

## 2012-08-27 DIAGNOSIS — Z9981 Dependence on supplemental oxygen: Secondary | ICD-10-CM | POA: Diagnosis not present

## 2012-08-27 DIAGNOSIS — I10 Essential (primary) hypertension: Secondary | ICD-10-CM | POA: Diagnosis not present

## 2012-08-27 DIAGNOSIS — I69928 Other speech and language deficits following unspecified cerebrovascular disease: Secondary | ICD-10-CM | POA: Diagnosis not present

## 2012-08-27 DIAGNOSIS — R319 Hematuria, unspecified: Secondary | ICD-10-CM | POA: Diagnosis not present

## 2012-08-27 DIAGNOSIS — E785 Hyperlipidemia, unspecified: Secondary | ICD-10-CM | POA: Diagnosis not present

## 2012-08-28 DIAGNOSIS — I69959 Hemiplegia and hemiparesis following unspecified cerebrovascular disease affecting unspecified side: Secondary | ICD-10-CM | POA: Diagnosis not present

## 2012-08-28 DIAGNOSIS — R509 Fever, unspecified: Secondary | ICD-10-CM | POA: Diagnosis not present

## 2012-08-28 DIAGNOSIS — R319 Hematuria, unspecified: Secondary | ICD-10-CM | POA: Diagnosis not present

## 2012-08-28 DIAGNOSIS — B9689 Other specified bacterial agents as the cause of diseases classified elsewhere: Secondary | ICD-10-CM | POA: Diagnosis not present

## 2012-08-28 DIAGNOSIS — R1310 Dysphagia, unspecified: Secondary | ICD-10-CM | POA: Diagnosis not present

## 2012-08-28 DIAGNOSIS — R112 Nausea with vomiting, unspecified: Secondary | ICD-10-CM | POA: Diagnosis not present

## 2012-08-28 DIAGNOSIS — R6889 Other general symptoms and signs: Secondary | ICD-10-CM | POA: Diagnosis not present

## 2012-08-28 DIAGNOSIS — A419 Sepsis, unspecified organism: Secondary | ICD-10-CM | POA: Diagnosis not present

## 2012-08-28 DIAGNOSIS — E785 Hyperlipidemia, unspecified: Secondary | ICD-10-CM | POA: Diagnosis not present

## 2012-08-28 DIAGNOSIS — N390 Urinary tract infection, site not specified: Secondary | ICD-10-CM | POA: Diagnosis not present

## 2012-08-28 DIAGNOSIS — E860 Dehydration: Secondary | ICD-10-CM | POA: Diagnosis not present

## 2012-08-28 DIAGNOSIS — A499 Bacterial infection, unspecified: Secondary | ICD-10-CM | POA: Diagnosis not present

## 2012-08-28 DIAGNOSIS — Z9981 Dependence on supplemental oxygen: Secondary | ICD-10-CM | POA: Diagnosis not present

## 2012-08-28 DIAGNOSIS — N179 Acute kidney failure, unspecified: Secondary | ICD-10-CM | POA: Diagnosis not present

## 2012-08-28 DIAGNOSIS — I10 Essential (primary) hypertension: Secondary | ICD-10-CM | POA: Diagnosis not present

## 2012-08-28 DIAGNOSIS — I69928 Other speech and language deficits following unspecified cerebrovascular disease: Secondary | ICD-10-CM | POA: Diagnosis not present

## 2012-08-29 DIAGNOSIS — E785 Hyperlipidemia, unspecified: Secondary | ICD-10-CM | POA: Diagnosis present

## 2012-08-29 DIAGNOSIS — B961 Klebsiella pneumoniae [K. pneumoniae] as the cause of diseases classified elsewhere: Secondary | ICD-10-CM | POA: Diagnosis present

## 2012-08-29 DIAGNOSIS — R509 Fever, unspecified: Secondary | ICD-10-CM | POA: Diagnosis not present

## 2012-08-29 DIAGNOSIS — I739 Peripheral vascular disease, unspecified: Secondary | ICD-10-CM | POA: Diagnosis present

## 2012-08-29 DIAGNOSIS — R6510 Systemic inflammatory response syndrome (SIRS) of non-infectious origin without acute organ dysfunction: Secondary | ICD-10-CM | POA: Diagnosis not present

## 2012-08-29 DIAGNOSIS — E860 Dehydration: Secondary | ICD-10-CM | POA: Diagnosis not present

## 2012-08-29 DIAGNOSIS — E876 Hypokalemia: Secondary | ICD-10-CM | POA: Diagnosis not present

## 2012-08-29 DIAGNOSIS — Z7982 Long term (current) use of aspirin: Secondary | ICD-10-CM | POA: Diagnosis not present

## 2012-08-29 DIAGNOSIS — I69998 Other sequelae following unspecified cerebrovascular disease: Secondary | ICD-10-CM | POA: Diagnosis not present

## 2012-08-29 DIAGNOSIS — R4182 Altered mental status, unspecified: Secondary | ICD-10-CM | POA: Diagnosis not present

## 2012-08-29 DIAGNOSIS — I69959 Hemiplegia and hemiparesis following unspecified cerebrovascular disease affecting unspecified side: Secondary | ICD-10-CM | POA: Diagnosis not present

## 2012-08-29 DIAGNOSIS — N390 Urinary tract infection, site not specified: Secondary | ICD-10-CM | POA: Diagnosis not present

## 2012-08-29 DIAGNOSIS — I10 Essential (primary) hypertension: Secondary | ICD-10-CM | POA: Diagnosis present

## 2012-08-29 DIAGNOSIS — R262 Difficulty in walking, not elsewhere classified: Secondary | ICD-10-CM | POA: Diagnosis present

## 2012-08-29 DIAGNOSIS — Z79899 Other long term (current) drug therapy: Secondary | ICD-10-CM | POA: Diagnosis not present

## 2012-08-29 DIAGNOSIS — A499 Bacterial infection, unspecified: Secondary | ICD-10-CM | POA: Diagnosis not present

## 2012-08-29 DIAGNOSIS — A419 Sepsis, unspecified organism: Secondary | ICD-10-CM | POA: Diagnosis not present

## 2012-08-29 DIAGNOSIS — N179 Acute kidney failure, unspecified: Secondary | ICD-10-CM | POA: Diagnosis not present

## 2012-08-29 DIAGNOSIS — R1310 Dysphagia, unspecified: Secondary | ICD-10-CM | POA: Diagnosis present

## 2012-09-01 DIAGNOSIS — I69928 Other speech and language deficits following unspecified cerebrovascular disease: Secondary | ICD-10-CM | POA: Diagnosis not present

## 2012-09-01 DIAGNOSIS — R319 Hematuria, unspecified: Secondary | ICD-10-CM | POA: Diagnosis not present

## 2012-09-01 DIAGNOSIS — E785 Hyperlipidemia, unspecified: Secondary | ICD-10-CM | POA: Diagnosis not present

## 2012-09-01 DIAGNOSIS — Z9981 Dependence on supplemental oxygen: Secondary | ICD-10-CM | POA: Diagnosis not present

## 2012-09-01 DIAGNOSIS — I10 Essential (primary) hypertension: Secondary | ICD-10-CM | POA: Diagnosis not present

## 2012-09-03 DIAGNOSIS — I69928 Other speech and language deficits following unspecified cerebrovascular disease: Secondary | ICD-10-CM | POA: Diagnosis not present

## 2012-09-03 DIAGNOSIS — Z9981 Dependence on supplemental oxygen: Secondary | ICD-10-CM | POA: Diagnosis not present

## 2012-09-03 DIAGNOSIS — E785 Hyperlipidemia, unspecified: Secondary | ICD-10-CM | POA: Diagnosis not present

## 2012-09-03 DIAGNOSIS — I10 Essential (primary) hypertension: Secondary | ICD-10-CM | POA: Diagnosis not present

## 2012-09-03 DIAGNOSIS — R319 Hematuria, unspecified: Secondary | ICD-10-CM | POA: Diagnosis not present

## 2012-09-05 DIAGNOSIS — Z9981 Dependence on supplemental oxygen: Secondary | ICD-10-CM | POA: Diagnosis not present

## 2012-09-05 DIAGNOSIS — I10 Essential (primary) hypertension: Secondary | ICD-10-CM | POA: Diagnosis not present

## 2012-09-05 DIAGNOSIS — R319 Hematuria, unspecified: Secondary | ICD-10-CM | POA: Diagnosis not present

## 2012-09-05 DIAGNOSIS — I69928 Other speech and language deficits following unspecified cerebrovascular disease: Secondary | ICD-10-CM | POA: Diagnosis not present

## 2012-09-05 DIAGNOSIS — E785 Hyperlipidemia, unspecified: Secondary | ICD-10-CM | POA: Diagnosis not present

## 2012-09-06 DIAGNOSIS — I10 Essential (primary) hypertension: Secondary | ICD-10-CM | POA: Diagnosis not present

## 2012-09-06 DIAGNOSIS — I69928 Other speech and language deficits following unspecified cerebrovascular disease: Secondary | ICD-10-CM | POA: Diagnosis not present

## 2012-09-06 DIAGNOSIS — R319 Hematuria, unspecified: Secondary | ICD-10-CM | POA: Diagnosis not present

## 2012-09-06 DIAGNOSIS — E785 Hyperlipidemia, unspecified: Secondary | ICD-10-CM | POA: Diagnosis not present

## 2012-09-06 DIAGNOSIS — Z9981 Dependence on supplemental oxygen: Secondary | ICD-10-CM | POA: Diagnosis not present

## 2012-09-07 DIAGNOSIS — E876 Hypokalemia: Secondary | ICD-10-CM | POA: Diagnosis not present

## 2012-09-07 DIAGNOSIS — F329 Major depressive disorder, single episode, unspecified: Secondary | ICD-10-CM | POA: Diagnosis not present

## 2012-09-07 DIAGNOSIS — N390 Urinary tract infection, site not specified: Secondary | ICD-10-CM | POA: Diagnosis not present

## 2012-09-07 DIAGNOSIS — I6350 Cerebral infarction due to unspecified occlusion or stenosis of unspecified cerebral artery: Secondary | ICD-10-CM | POA: Diagnosis not present

## 2012-09-10 DIAGNOSIS — I10 Essential (primary) hypertension: Secondary | ICD-10-CM | POA: Diagnosis not present

## 2012-09-10 DIAGNOSIS — I69928 Other speech and language deficits following unspecified cerebrovascular disease: Secondary | ICD-10-CM | POA: Diagnosis not present

## 2012-09-10 DIAGNOSIS — Z9981 Dependence on supplemental oxygen: Secondary | ICD-10-CM | POA: Diagnosis not present

## 2012-09-10 DIAGNOSIS — R319 Hematuria, unspecified: Secondary | ICD-10-CM | POA: Diagnosis not present

## 2012-09-10 DIAGNOSIS — E785 Hyperlipidemia, unspecified: Secondary | ICD-10-CM | POA: Diagnosis not present

## 2012-09-11 DIAGNOSIS — I10 Essential (primary) hypertension: Secondary | ICD-10-CM | POA: Diagnosis not present

## 2012-09-11 DIAGNOSIS — R319 Hematuria, unspecified: Secondary | ICD-10-CM | POA: Diagnosis not present

## 2012-09-11 DIAGNOSIS — I69928 Other speech and language deficits following unspecified cerebrovascular disease: Secondary | ICD-10-CM | POA: Diagnosis not present

## 2012-09-11 DIAGNOSIS — Z9981 Dependence on supplemental oxygen: Secondary | ICD-10-CM | POA: Diagnosis not present

## 2012-09-11 DIAGNOSIS — E785 Hyperlipidemia, unspecified: Secondary | ICD-10-CM | POA: Diagnosis not present

## 2012-09-12 DIAGNOSIS — E785 Hyperlipidemia, unspecified: Secondary | ICD-10-CM | POA: Diagnosis not present

## 2012-09-12 DIAGNOSIS — I10 Essential (primary) hypertension: Secondary | ICD-10-CM | POA: Diagnosis not present

## 2012-09-12 DIAGNOSIS — Z9981 Dependence on supplemental oxygen: Secondary | ICD-10-CM | POA: Diagnosis not present

## 2012-09-12 DIAGNOSIS — I69928 Other speech and language deficits following unspecified cerebrovascular disease: Secondary | ICD-10-CM | POA: Diagnosis not present

## 2012-09-12 DIAGNOSIS — R319 Hematuria, unspecified: Secondary | ICD-10-CM | POA: Diagnosis not present

## 2012-09-14 DIAGNOSIS — R319 Hematuria, unspecified: Secondary | ICD-10-CM | POA: Diagnosis not present

## 2012-09-14 DIAGNOSIS — E785 Hyperlipidemia, unspecified: Secondary | ICD-10-CM | POA: Diagnosis not present

## 2012-09-14 DIAGNOSIS — Z9981 Dependence on supplemental oxygen: Secondary | ICD-10-CM | POA: Diagnosis not present

## 2012-09-14 DIAGNOSIS — I69928 Other speech and language deficits following unspecified cerebrovascular disease: Secondary | ICD-10-CM | POA: Diagnosis not present

## 2012-09-14 DIAGNOSIS — I10 Essential (primary) hypertension: Secondary | ICD-10-CM | POA: Diagnosis not present

## 2012-09-17 DIAGNOSIS — E785 Hyperlipidemia, unspecified: Secondary | ICD-10-CM | POA: Diagnosis not present

## 2012-09-17 DIAGNOSIS — R319 Hematuria, unspecified: Secondary | ICD-10-CM | POA: Diagnosis not present

## 2012-09-17 DIAGNOSIS — I69928 Other speech and language deficits following unspecified cerebrovascular disease: Secondary | ICD-10-CM | POA: Diagnosis not present

## 2012-09-17 DIAGNOSIS — Z9981 Dependence on supplemental oxygen: Secondary | ICD-10-CM | POA: Diagnosis not present

## 2012-09-17 DIAGNOSIS — I10 Essential (primary) hypertension: Secondary | ICD-10-CM | POA: Diagnosis not present

## 2012-09-18 DIAGNOSIS — E785 Hyperlipidemia, unspecified: Secondary | ICD-10-CM | POA: Diagnosis not present

## 2012-09-18 DIAGNOSIS — R319 Hematuria, unspecified: Secondary | ICD-10-CM | POA: Diagnosis not present

## 2012-09-18 DIAGNOSIS — I10 Essential (primary) hypertension: Secondary | ICD-10-CM | POA: Diagnosis not present

## 2012-09-18 DIAGNOSIS — I69928 Other speech and language deficits following unspecified cerebrovascular disease: Secondary | ICD-10-CM | POA: Diagnosis not present

## 2012-09-18 DIAGNOSIS — Z9981 Dependence on supplemental oxygen: Secondary | ICD-10-CM | POA: Diagnosis not present

## 2012-09-19 DIAGNOSIS — R319 Hematuria, unspecified: Secondary | ICD-10-CM | POA: Diagnosis not present

## 2012-09-19 DIAGNOSIS — I10 Essential (primary) hypertension: Secondary | ICD-10-CM | POA: Diagnosis not present

## 2012-09-19 DIAGNOSIS — Z9981 Dependence on supplemental oxygen: Secondary | ICD-10-CM | POA: Diagnosis not present

## 2012-09-19 DIAGNOSIS — E785 Hyperlipidemia, unspecified: Secondary | ICD-10-CM | POA: Diagnosis not present

## 2012-09-19 DIAGNOSIS — I69928 Other speech and language deficits following unspecified cerebrovascular disease: Secondary | ICD-10-CM | POA: Diagnosis not present

## 2012-09-20 DIAGNOSIS — I69928 Other speech and language deficits following unspecified cerebrovascular disease: Secondary | ICD-10-CM | POA: Diagnosis not present

## 2012-09-20 DIAGNOSIS — R319 Hematuria, unspecified: Secondary | ICD-10-CM | POA: Diagnosis not present

## 2012-09-20 DIAGNOSIS — Z9981 Dependence on supplemental oxygen: Secondary | ICD-10-CM | POA: Diagnosis not present

## 2012-09-20 DIAGNOSIS — E785 Hyperlipidemia, unspecified: Secondary | ICD-10-CM | POA: Diagnosis not present

## 2012-09-20 DIAGNOSIS — I10 Essential (primary) hypertension: Secondary | ICD-10-CM | POA: Diagnosis not present

## 2012-09-21 DIAGNOSIS — I69928 Other speech and language deficits following unspecified cerebrovascular disease: Secondary | ICD-10-CM | POA: Diagnosis not present

## 2012-09-21 DIAGNOSIS — Z9981 Dependence on supplemental oxygen: Secondary | ICD-10-CM | POA: Diagnosis not present

## 2012-09-21 DIAGNOSIS — R319 Hematuria, unspecified: Secondary | ICD-10-CM | POA: Diagnosis not present

## 2012-09-21 DIAGNOSIS — E785 Hyperlipidemia, unspecified: Secondary | ICD-10-CM | POA: Diagnosis not present

## 2012-09-21 DIAGNOSIS — I10 Essential (primary) hypertension: Secondary | ICD-10-CM | POA: Diagnosis not present

## 2012-09-24 DIAGNOSIS — I6789 Other cerebrovascular disease: Secondary | ICD-10-CM | POA: Diagnosis not present

## 2012-09-25 DIAGNOSIS — R319 Hematuria, unspecified: Secondary | ICD-10-CM | POA: Diagnosis not present

## 2012-09-25 DIAGNOSIS — I69928 Other speech and language deficits following unspecified cerebrovascular disease: Secondary | ICD-10-CM | POA: Diagnosis not present

## 2012-09-25 DIAGNOSIS — I10 Essential (primary) hypertension: Secondary | ICD-10-CM | POA: Diagnosis not present

## 2012-09-25 DIAGNOSIS — Z9981 Dependence on supplemental oxygen: Secondary | ICD-10-CM | POA: Diagnosis not present

## 2012-09-25 DIAGNOSIS — E785 Hyperlipidemia, unspecified: Secondary | ICD-10-CM | POA: Diagnosis not present

## 2012-09-26 DIAGNOSIS — I69928 Other speech and language deficits following unspecified cerebrovascular disease: Secondary | ICD-10-CM | POA: Diagnosis not present

## 2012-09-26 DIAGNOSIS — Z9981 Dependence on supplemental oxygen: Secondary | ICD-10-CM | POA: Diagnosis not present

## 2012-09-26 DIAGNOSIS — E785 Hyperlipidemia, unspecified: Secondary | ICD-10-CM | POA: Diagnosis not present

## 2012-09-26 DIAGNOSIS — R319 Hematuria, unspecified: Secondary | ICD-10-CM | POA: Diagnosis not present

## 2012-09-26 DIAGNOSIS — I10 Essential (primary) hypertension: Secondary | ICD-10-CM | POA: Diagnosis not present

## 2012-09-27 DIAGNOSIS — I10 Essential (primary) hypertension: Secondary | ICD-10-CM | POA: Diagnosis not present

## 2012-09-27 DIAGNOSIS — I69928 Other speech and language deficits following unspecified cerebrovascular disease: Secondary | ICD-10-CM | POA: Diagnosis not present

## 2012-09-27 DIAGNOSIS — E785 Hyperlipidemia, unspecified: Secondary | ICD-10-CM | POA: Diagnosis not present

## 2012-09-27 DIAGNOSIS — Z9981 Dependence on supplemental oxygen: Secondary | ICD-10-CM | POA: Diagnosis not present

## 2012-09-27 DIAGNOSIS — R319 Hematuria, unspecified: Secondary | ICD-10-CM | POA: Diagnosis not present

## 2012-09-28 DIAGNOSIS — R319 Hematuria, unspecified: Secondary | ICD-10-CM | POA: Diagnosis not present

## 2012-09-28 DIAGNOSIS — I69928 Other speech and language deficits following unspecified cerebrovascular disease: Secondary | ICD-10-CM | POA: Diagnosis not present

## 2012-09-28 DIAGNOSIS — Z9981 Dependence on supplemental oxygen: Secondary | ICD-10-CM | POA: Diagnosis not present

## 2012-09-28 DIAGNOSIS — E785 Hyperlipidemia, unspecified: Secondary | ICD-10-CM | POA: Diagnosis not present

## 2012-09-28 DIAGNOSIS — I10 Essential (primary) hypertension: Secondary | ICD-10-CM | POA: Diagnosis not present

## 2012-10-01 DIAGNOSIS — R319 Hematuria, unspecified: Secondary | ICD-10-CM | POA: Diagnosis not present

## 2012-10-01 DIAGNOSIS — I10 Essential (primary) hypertension: Secondary | ICD-10-CM | POA: Diagnosis not present

## 2012-10-01 DIAGNOSIS — I69928 Other speech and language deficits following unspecified cerebrovascular disease: Secondary | ICD-10-CM | POA: Diagnosis not present

## 2012-10-01 DIAGNOSIS — Z9981 Dependence on supplemental oxygen: Secondary | ICD-10-CM | POA: Diagnosis not present

## 2012-10-01 DIAGNOSIS — E785 Hyperlipidemia, unspecified: Secondary | ICD-10-CM | POA: Diagnosis not present

## 2012-10-02 DIAGNOSIS — E785 Hyperlipidemia, unspecified: Secondary | ICD-10-CM | POA: Diagnosis not present

## 2012-10-02 DIAGNOSIS — I69928 Other speech and language deficits following unspecified cerebrovascular disease: Secondary | ICD-10-CM | POA: Diagnosis not present

## 2012-10-02 DIAGNOSIS — Z9981 Dependence on supplemental oxygen: Secondary | ICD-10-CM | POA: Diagnosis not present

## 2012-10-02 DIAGNOSIS — I10 Essential (primary) hypertension: Secondary | ICD-10-CM | POA: Diagnosis not present

## 2012-10-02 DIAGNOSIS — R319 Hematuria, unspecified: Secondary | ICD-10-CM | POA: Diagnosis not present

## 2012-10-03 DIAGNOSIS — R319 Hematuria, unspecified: Secondary | ICD-10-CM | POA: Diagnosis not present

## 2012-10-03 DIAGNOSIS — Z9981 Dependence on supplemental oxygen: Secondary | ICD-10-CM | POA: Diagnosis not present

## 2012-10-03 DIAGNOSIS — I10 Essential (primary) hypertension: Secondary | ICD-10-CM | POA: Diagnosis not present

## 2012-10-03 DIAGNOSIS — E785 Hyperlipidemia, unspecified: Secondary | ICD-10-CM | POA: Diagnosis not present

## 2012-10-03 DIAGNOSIS — I69928 Other speech and language deficits following unspecified cerebrovascular disease: Secondary | ICD-10-CM | POA: Diagnosis not present

## 2012-10-05 DIAGNOSIS — I10 Essential (primary) hypertension: Secondary | ICD-10-CM | POA: Diagnosis not present

## 2012-10-05 DIAGNOSIS — E785 Hyperlipidemia, unspecified: Secondary | ICD-10-CM | POA: Diagnosis not present

## 2012-10-05 DIAGNOSIS — Z9981 Dependence on supplemental oxygen: Secondary | ICD-10-CM | POA: Diagnosis not present

## 2012-10-05 DIAGNOSIS — R319 Hematuria, unspecified: Secondary | ICD-10-CM | POA: Diagnosis not present

## 2012-10-05 DIAGNOSIS — I69928 Other speech and language deficits following unspecified cerebrovascular disease: Secondary | ICD-10-CM | POA: Diagnosis not present

## 2012-10-08 DIAGNOSIS — E785 Hyperlipidemia, unspecified: Secondary | ICD-10-CM | POA: Diagnosis not present

## 2012-10-08 DIAGNOSIS — R319 Hematuria, unspecified: Secondary | ICD-10-CM | POA: Diagnosis not present

## 2012-10-08 DIAGNOSIS — I69928 Other speech and language deficits following unspecified cerebrovascular disease: Secondary | ICD-10-CM | POA: Diagnosis not present

## 2012-10-08 DIAGNOSIS — I10 Essential (primary) hypertension: Secondary | ICD-10-CM | POA: Diagnosis not present

## 2012-10-08 DIAGNOSIS — Z9981 Dependence on supplemental oxygen: Secondary | ICD-10-CM | POA: Diagnosis not present

## 2012-10-09 DIAGNOSIS — I10 Essential (primary) hypertension: Secondary | ICD-10-CM | POA: Diagnosis not present

## 2012-10-09 DIAGNOSIS — Z9981 Dependence on supplemental oxygen: Secondary | ICD-10-CM | POA: Diagnosis not present

## 2012-10-09 DIAGNOSIS — I69928 Other speech and language deficits following unspecified cerebrovascular disease: Secondary | ICD-10-CM | POA: Diagnosis not present

## 2012-10-09 DIAGNOSIS — E785 Hyperlipidemia, unspecified: Secondary | ICD-10-CM | POA: Diagnosis not present

## 2012-10-09 DIAGNOSIS — R319 Hematuria, unspecified: Secondary | ICD-10-CM | POA: Diagnosis not present

## 2012-10-10 DIAGNOSIS — I10 Essential (primary) hypertension: Secondary | ICD-10-CM | POA: Diagnosis not present

## 2012-10-10 DIAGNOSIS — I69928 Other speech and language deficits following unspecified cerebrovascular disease: Secondary | ICD-10-CM | POA: Diagnosis not present

## 2012-10-10 DIAGNOSIS — Z9981 Dependence on supplemental oxygen: Secondary | ICD-10-CM | POA: Diagnosis not present

## 2012-10-10 DIAGNOSIS — E785 Hyperlipidemia, unspecified: Secondary | ICD-10-CM | POA: Diagnosis not present

## 2012-10-10 DIAGNOSIS — R319 Hematuria, unspecified: Secondary | ICD-10-CM | POA: Diagnosis not present

## 2012-10-11 DIAGNOSIS — I10 Essential (primary) hypertension: Secondary | ICD-10-CM | POA: Diagnosis not present

## 2012-10-11 DIAGNOSIS — E785 Hyperlipidemia, unspecified: Secondary | ICD-10-CM | POA: Diagnosis not present

## 2012-10-11 DIAGNOSIS — Z9981 Dependence on supplemental oxygen: Secondary | ICD-10-CM | POA: Diagnosis not present

## 2012-10-11 DIAGNOSIS — I69928 Other speech and language deficits following unspecified cerebrovascular disease: Secondary | ICD-10-CM | POA: Diagnosis not present

## 2012-10-11 DIAGNOSIS — R319 Hematuria, unspecified: Secondary | ICD-10-CM | POA: Diagnosis not present

## 2012-10-12 DIAGNOSIS — R319 Hematuria, unspecified: Secondary | ICD-10-CM | POA: Diagnosis not present

## 2012-10-12 DIAGNOSIS — I69928 Other speech and language deficits following unspecified cerebrovascular disease: Secondary | ICD-10-CM | POA: Diagnosis not present

## 2012-10-12 DIAGNOSIS — Z9981 Dependence on supplemental oxygen: Secondary | ICD-10-CM | POA: Diagnosis not present

## 2012-10-12 DIAGNOSIS — I10 Essential (primary) hypertension: Secondary | ICD-10-CM | POA: Diagnosis not present

## 2012-10-12 DIAGNOSIS — E785 Hyperlipidemia, unspecified: Secondary | ICD-10-CM | POA: Diagnosis not present

## 2012-10-15 DIAGNOSIS — Z9981 Dependence on supplemental oxygen: Secondary | ICD-10-CM | POA: Diagnosis not present

## 2012-10-15 DIAGNOSIS — E785 Hyperlipidemia, unspecified: Secondary | ICD-10-CM | POA: Diagnosis not present

## 2012-10-15 DIAGNOSIS — I69928 Other speech and language deficits following unspecified cerebrovascular disease: Secondary | ICD-10-CM | POA: Diagnosis not present

## 2012-10-15 DIAGNOSIS — R319 Hematuria, unspecified: Secondary | ICD-10-CM | POA: Diagnosis not present

## 2012-10-15 DIAGNOSIS — I10 Essential (primary) hypertension: Secondary | ICD-10-CM | POA: Diagnosis not present

## 2012-10-16 DIAGNOSIS — E785 Hyperlipidemia, unspecified: Secondary | ICD-10-CM | POA: Diagnosis not present

## 2012-10-16 DIAGNOSIS — R319 Hematuria, unspecified: Secondary | ICD-10-CM | POA: Diagnosis not present

## 2012-10-16 DIAGNOSIS — I69928 Other speech and language deficits following unspecified cerebrovascular disease: Secondary | ICD-10-CM | POA: Diagnosis not present

## 2012-10-16 DIAGNOSIS — Z9981 Dependence on supplemental oxygen: Secondary | ICD-10-CM | POA: Diagnosis not present

## 2012-10-16 DIAGNOSIS — I10 Essential (primary) hypertension: Secondary | ICD-10-CM | POA: Diagnosis not present

## 2012-10-17 DIAGNOSIS — E785 Hyperlipidemia, unspecified: Secondary | ICD-10-CM | POA: Diagnosis not present

## 2012-10-17 DIAGNOSIS — I10 Essential (primary) hypertension: Secondary | ICD-10-CM | POA: Diagnosis not present

## 2012-10-17 DIAGNOSIS — I69928 Other speech and language deficits following unspecified cerebrovascular disease: Secondary | ICD-10-CM | POA: Diagnosis not present

## 2012-10-17 DIAGNOSIS — R319 Hematuria, unspecified: Secondary | ICD-10-CM | POA: Diagnosis not present

## 2012-10-17 DIAGNOSIS — Z9981 Dependence on supplemental oxygen: Secondary | ICD-10-CM | POA: Diagnosis not present

## 2012-10-18 DIAGNOSIS — I69928 Other speech and language deficits following unspecified cerebrovascular disease: Secondary | ICD-10-CM | POA: Diagnosis not present

## 2012-10-18 DIAGNOSIS — E785 Hyperlipidemia, unspecified: Secondary | ICD-10-CM | POA: Diagnosis not present

## 2012-10-18 DIAGNOSIS — R319 Hematuria, unspecified: Secondary | ICD-10-CM | POA: Diagnosis not present

## 2012-10-18 DIAGNOSIS — I10 Essential (primary) hypertension: Secondary | ICD-10-CM | POA: Diagnosis not present

## 2012-10-18 DIAGNOSIS — Z9981 Dependence on supplemental oxygen: Secondary | ICD-10-CM | POA: Diagnosis not present

## 2012-10-19 DIAGNOSIS — E785 Hyperlipidemia, unspecified: Secondary | ICD-10-CM | POA: Diagnosis not present

## 2012-10-19 DIAGNOSIS — I69928 Other speech and language deficits following unspecified cerebrovascular disease: Secondary | ICD-10-CM | POA: Diagnosis not present

## 2012-10-19 DIAGNOSIS — R319 Hematuria, unspecified: Secondary | ICD-10-CM | POA: Diagnosis not present

## 2012-10-19 DIAGNOSIS — Z9981 Dependence on supplemental oxygen: Secondary | ICD-10-CM | POA: Diagnosis not present

## 2012-10-19 DIAGNOSIS — I10 Essential (primary) hypertension: Secondary | ICD-10-CM | POA: Diagnosis not present

## 2012-10-24 DIAGNOSIS — I6789 Other cerebrovascular disease: Secondary | ICD-10-CM | POA: Diagnosis not present

## 2012-10-25 DIAGNOSIS — I10 Essential (primary) hypertension: Secondary | ICD-10-CM | POA: Diagnosis not present

## 2012-10-25 DIAGNOSIS — I69928 Other speech and language deficits following unspecified cerebrovascular disease: Secondary | ICD-10-CM | POA: Diagnosis not present

## 2012-10-25 DIAGNOSIS — R319 Hematuria, unspecified: Secondary | ICD-10-CM | POA: Diagnosis not present

## 2012-10-25 DIAGNOSIS — E785 Hyperlipidemia, unspecified: Secondary | ICD-10-CM | POA: Diagnosis not present

## 2012-10-25 DIAGNOSIS — Z9981 Dependence on supplemental oxygen: Secondary | ICD-10-CM | POA: Diagnosis not present

## 2012-10-26 DIAGNOSIS — I10 Essential (primary) hypertension: Secondary | ICD-10-CM | POA: Diagnosis not present

## 2012-10-26 DIAGNOSIS — I69928 Other speech and language deficits following unspecified cerebrovascular disease: Secondary | ICD-10-CM | POA: Diagnosis not present

## 2012-10-26 DIAGNOSIS — Z9981 Dependence on supplemental oxygen: Secondary | ICD-10-CM | POA: Diagnosis not present

## 2012-10-26 DIAGNOSIS — R1312 Dysphagia, oropharyngeal phase: Secondary | ICD-10-CM | POA: Diagnosis not present

## 2012-10-26 DIAGNOSIS — E785 Hyperlipidemia, unspecified: Secondary | ICD-10-CM | POA: Diagnosis not present

## 2012-11-05 DIAGNOSIS — Z7982 Long term (current) use of aspirin: Secondary | ICD-10-CM | POA: Diagnosis not present

## 2012-11-05 DIAGNOSIS — IMO0002 Reserved for concepts with insufficient information to code with codable children: Secondary | ICD-10-CM | POA: Diagnosis not present

## 2012-11-05 DIAGNOSIS — I69959 Hemiplegia and hemiparesis following unspecified cerebrovascular disease affecting unspecified side: Secondary | ICD-10-CM | POA: Diagnosis not present

## 2012-11-05 DIAGNOSIS — E7800 Pure hypercholesterolemia, unspecified: Secondary | ICD-10-CM | POA: Diagnosis not present

## 2012-11-05 DIAGNOSIS — R296 Repeated falls: Secondary | ICD-10-CM | POA: Diagnosis not present

## 2012-11-05 DIAGNOSIS — Z79899 Other long term (current) drug therapy: Secondary | ICD-10-CM | POA: Diagnosis not present

## 2012-11-05 DIAGNOSIS — I369 Nonrheumatic tricuspid valve disorder, unspecified: Secondary | ICD-10-CM | POA: Diagnosis not present

## 2012-11-05 DIAGNOSIS — I10 Essential (primary) hypertension: Secondary | ICD-10-CM | POA: Diagnosis not present

## 2012-11-06 DIAGNOSIS — Z9981 Dependence on supplemental oxygen: Secondary | ICD-10-CM | POA: Diagnosis not present

## 2012-11-06 DIAGNOSIS — I10 Essential (primary) hypertension: Secondary | ICD-10-CM | POA: Diagnosis not present

## 2012-11-06 DIAGNOSIS — I69928 Other speech and language deficits following unspecified cerebrovascular disease: Secondary | ICD-10-CM | POA: Diagnosis not present

## 2012-11-06 DIAGNOSIS — E785 Hyperlipidemia, unspecified: Secondary | ICD-10-CM | POA: Diagnosis not present

## 2012-11-06 DIAGNOSIS — R1312 Dysphagia, oropharyngeal phase: Secondary | ICD-10-CM | POA: Diagnosis not present

## 2012-11-08 DIAGNOSIS — R1312 Dysphagia, oropharyngeal phase: Secondary | ICD-10-CM | POA: Diagnosis not present

## 2012-11-08 DIAGNOSIS — I69928 Other speech and language deficits following unspecified cerebrovascular disease: Secondary | ICD-10-CM | POA: Diagnosis not present

## 2012-11-08 DIAGNOSIS — E785 Hyperlipidemia, unspecified: Secondary | ICD-10-CM | POA: Diagnosis not present

## 2012-11-08 DIAGNOSIS — Z9981 Dependence on supplemental oxygen: Secondary | ICD-10-CM | POA: Diagnosis not present

## 2012-11-08 DIAGNOSIS — I10 Essential (primary) hypertension: Secondary | ICD-10-CM | POA: Diagnosis not present

## 2012-11-09 DIAGNOSIS — H2510 Age-related nuclear cataract, unspecified eye: Secondary | ICD-10-CM | POA: Diagnosis not present

## 2012-11-13 DIAGNOSIS — M25469 Effusion, unspecified knee: Secondary | ICD-10-CM | POA: Diagnosis not present

## 2012-11-13 DIAGNOSIS — M1710 Unilateral primary osteoarthritis, unspecified knee: Secondary | ICD-10-CM | POA: Diagnosis not present

## 2012-11-13 DIAGNOSIS — M25569 Pain in unspecified knee: Secondary | ICD-10-CM | POA: Diagnosis not present

## 2012-11-15 DIAGNOSIS — Z9981 Dependence on supplemental oxygen: Secondary | ICD-10-CM | POA: Diagnosis not present

## 2012-11-15 DIAGNOSIS — E785 Hyperlipidemia, unspecified: Secondary | ICD-10-CM | POA: Diagnosis not present

## 2012-11-15 DIAGNOSIS — R1312 Dysphagia, oropharyngeal phase: Secondary | ICD-10-CM | POA: Diagnosis not present

## 2012-11-15 DIAGNOSIS — I69928 Other speech and language deficits following unspecified cerebrovascular disease: Secondary | ICD-10-CM | POA: Diagnosis not present

## 2012-11-15 DIAGNOSIS — I10 Essential (primary) hypertension: Secondary | ICD-10-CM | POA: Diagnosis not present

## 2012-11-16 DIAGNOSIS — R1312 Dysphagia, oropharyngeal phase: Secondary | ICD-10-CM | POA: Diagnosis not present

## 2012-11-16 DIAGNOSIS — I10 Essential (primary) hypertension: Secondary | ICD-10-CM | POA: Diagnosis not present

## 2012-11-16 DIAGNOSIS — Z9981 Dependence on supplemental oxygen: Secondary | ICD-10-CM | POA: Diagnosis not present

## 2012-11-16 DIAGNOSIS — I69928 Other speech and language deficits following unspecified cerebrovascular disease: Secondary | ICD-10-CM | POA: Diagnosis not present

## 2012-11-16 DIAGNOSIS — E785 Hyperlipidemia, unspecified: Secondary | ICD-10-CM | POA: Diagnosis not present

## 2012-11-21 DIAGNOSIS — E785 Hyperlipidemia, unspecified: Secondary | ICD-10-CM | POA: Diagnosis not present

## 2012-11-21 DIAGNOSIS — I10 Essential (primary) hypertension: Secondary | ICD-10-CM | POA: Diagnosis not present

## 2012-11-21 DIAGNOSIS — R1312 Dysphagia, oropharyngeal phase: Secondary | ICD-10-CM | POA: Diagnosis not present

## 2012-11-21 DIAGNOSIS — I69928 Other speech and language deficits following unspecified cerebrovascular disease: Secondary | ICD-10-CM | POA: Diagnosis not present

## 2012-11-21 DIAGNOSIS — Z9981 Dependence on supplemental oxygen: Secondary | ICD-10-CM | POA: Diagnosis not present

## 2012-11-22 DIAGNOSIS — G8110 Spastic hemiplegia affecting unspecified side: Secondary | ICD-10-CM | POA: Diagnosis not present

## 2012-11-22 DIAGNOSIS — I6789 Other cerebrovascular disease: Secondary | ICD-10-CM | POA: Diagnosis not present

## 2012-11-23 DIAGNOSIS — I69928 Other speech and language deficits following unspecified cerebrovascular disease: Secondary | ICD-10-CM | POA: Diagnosis not present

## 2012-11-23 DIAGNOSIS — R1312 Dysphagia, oropharyngeal phase: Secondary | ICD-10-CM | POA: Diagnosis not present

## 2012-11-23 DIAGNOSIS — Z9981 Dependence on supplemental oxygen: Secondary | ICD-10-CM | POA: Diagnosis not present

## 2012-11-23 DIAGNOSIS — E785 Hyperlipidemia, unspecified: Secondary | ICD-10-CM | POA: Diagnosis not present

## 2012-11-23 DIAGNOSIS — I10 Essential (primary) hypertension: Secondary | ICD-10-CM | POA: Diagnosis not present

## 2012-11-26 DIAGNOSIS — I69928 Other speech and language deficits following unspecified cerebrovascular disease: Secondary | ICD-10-CM | POA: Diagnosis not present

## 2012-11-26 DIAGNOSIS — I10 Essential (primary) hypertension: Secondary | ICD-10-CM | POA: Diagnosis not present

## 2012-11-26 DIAGNOSIS — E785 Hyperlipidemia, unspecified: Secondary | ICD-10-CM | POA: Diagnosis not present

## 2012-11-26 DIAGNOSIS — Z9981 Dependence on supplemental oxygen: Secondary | ICD-10-CM | POA: Diagnosis not present

## 2012-11-26 DIAGNOSIS — R1312 Dysphagia, oropharyngeal phase: Secondary | ICD-10-CM | POA: Diagnosis not present

## 2012-11-28 DIAGNOSIS — R1312 Dysphagia, oropharyngeal phase: Secondary | ICD-10-CM | POA: Diagnosis not present

## 2012-11-28 DIAGNOSIS — Z9981 Dependence on supplemental oxygen: Secondary | ICD-10-CM | POA: Diagnosis not present

## 2012-11-28 DIAGNOSIS — I69928 Other speech and language deficits following unspecified cerebrovascular disease: Secondary | ICD-10-CM | POA: Diagnosis not present

## 2012-11-28 DIAGNOSIS — E785 Hyperlipidemia, unspecified: Secondary | ICD-10-CM | POA: Diagnosis not present

## 2012-11-28 DIAGNOSIS — I10 Essential (primary) hypertension: Secondary | ICD-10-CM | POA: Diagnosis not present

## 2012-11-30 DIAGNOSIS — I69928 Other speech and language deficits following unspecified cerebrovascular disease: Secondary | ICD-10-CM | POA: Diagnosis not present

## 2012-11-30 DIAGNOSIS — E785 Hyperlipidemia, unspecified: Secondary | ICD-10-CM | POA: Diagnosis not present

## 2012-11-30 DIAGNOSIS — Z9981 Dependence on supplemental oxygen: Secondary | ICD-10-CM | POA: Diagnosis not present

## 2012-11-30 DIAGNOSIS — R1312 Dysphagia, oropharyngeal phase: Secondary | ICD-10-CM | POA: Diagnosis not present

## 2012-11-30 DIAGNOSIS — I10 Essential (primary) hypertension: Secondary | ICD-10-CM | POA: Diagnosis not present

## 2012-12-03 DIAGNOSIS — I69928 Other speech and language deficits following unspecified cerebrovascular disease: Secondary | ICD-10-CM | POA: Diagnosis not present

## 2012-12-03 DIAGNOSIS — Z9981 Dependence on supplemental oxygen: Secondary | ICD-10-CM | POA: Diagnosis not present

## 2012-12-03 DIAGNOSIS — I10 Essential (primary) hypertension: Secondary | ICD-10-CM | POA: Diagnosis not present

## 2012-12-03 DIAGNOSIS — R1312 Dysphagia, oropharyngeal phase: Secondary | ICD-10-CM | POA: Diagnosis not present

## 2012-12-03 DIAGNOSIS — E785 Hyperlipidemia, unspecified: Secondary | ICD-10-CM | POA: Diagnosis not present

## 2012-12-04 DIAGNOSIS — E785 Hyperlipidemia, unspecified: Secondary | ICD-10-CM | POA: Diagnosis not present

## 2012-12-04 DIAGNOSIS — R1312 Dysphagia, oropharyngeal phase: Secondary | ICD-10-CM | POA: Diagnosis not present

## 2012-12-04 DIAGNOSIS — Z9981 Dependence on supplemental oxygen: Secondary | ICD-10-CM | POA: Diagnosis not present

## 2012-12-04 DIAGNOSIS — I69928 Other speech and language deficits following unspecified cerebrovascular disease: Secondary | ICD-10-CM | POA: Diagnosis not present

## 2012-12-04 DIAGNOSIS — I10 Essential (primary) hypertension: Secondary | ICD-10-CM | POA: Diagnosis not present

## 2012-12-05 DIAGNOSIS — I69928 Other speech and language deficits following unspecified cerebrovascular disease: Secondary | ICD-10-CM | POA: Diagnosis not present

## 2012-12-05 DIAGNOSIS — E785 Hyperlipidemia, unspecified: Secondary | ICD-10-CM | POA: Diagnosis not present

## 2012-12-05 DIAGNOSIS — Z9981 Dependence on supplemental oxygen: Secondary | ICD-10-CM | POA: Diagnosis not present

## 2012-12-05 DIAGNOSIS — I10 Essential (primary) hypertension: Secondary | ICD-10-CM | POA: Diagnosis not present

## 2012-12-05 DIAGNOSIS — R1312 Dysphagia, oropharyngeal phase: Secondary | ICD-10-CM | POA: Diagnosis not present

## 2012-12-06 DIAGNOSIS — E785 Hyperlipidemia, unspecified: Secondary | ICD-10-CM | POA: Diagnosis not present

## 2012-12-06 DIAGNOSIS — R1312 Dysphagia, oropharyngeal phase: Secondary | ICD-10-CM | POA: Diagnosis not present

## 2012-12-06 DIAGNOSIS — I69928 Other speech and language deficits following unspecified cerebrovascular disease: Secondary | ICD-10-CM | POA: Diagnosis not present

## 2012-12-06 DIAGNOSIS — I10 Essential (primary) hypertension: Secondary | ICD-10-CM | POA: Diagnosis not present

## 2012-12-06 DIAGNOSIS — Z9981 Dependence on supplemental oxygen: Secondary | ICD-10-CM | POA: Diagnosis not present

## 2012-12-10 DIAGNOSIS — H25019 Cortical age-related cataract, unspecified eye: Secondary | ICD-10-CM | POA: Diagnosis not present

## 2012-12-10 DIAGNOSIS — H2510 Age-related nuclear cataract, unspecified eye: Secondary | ICD-10-CM | POA: Diagnosis not present

## 2012-12-10 DIAGNOSIS — H18419 Arcus senilis, unspecified eye: Secondary | ICD-10-CM | POA: Diagnosis not present

## 2012-12-11 DIAGNOSIS — Z9981 Dependence on supplemental oxygen: Secondary | ICD-10-CM | POA: Diagnosis not present

## 2012-12-11 DIAGNOSIS — R1312 Dysphagia, oropharyngeal phase: Secondary | ICD-10-CM | POA: Diagnosis not present

## 2012-12-11 DIAGNOSIS — E785 Hyperlipidemia, unspecified: Secondary | ICD-10-CM | POA: Diagnosis not present

## 2012-12-11 DIAGNOSIS — I69928 Other speech and language deficits following unspecified cerebrovascular disease: Secondary | ICD-10-CM | POA: Diagnosis not present

## 2012-12-11 DIAGNOSIS — I10 Essential (primary) hypertension: Secondary | ICD-10-CM | POA: Diagnosis not present

## 2012-12-12 DIAGNOSIS — R1312 Dysphagia, oropharyngeal phase: Secondary | ICD-10-CM | POA: Diagnosis not present

## 2012-12-12 DIAGNOSIS — I69928 Other speech and language deficits following unspecified cerebrovascular disease: Secondary | ICD-10-CM | POA: Diagnosis not present

## 2012-12-12 DIAGNOSIS — E785 Hyperlipidemia, unspecified: Secondary | ICD-10-CM | POA: Diagnosis not present

## 2012-12-12 DIAGNOSIS — I10 Essential (primary) hypertension: Secondary | ICD-10-CM | POA: Diagnosis not present

## 2012-12-12 DIAGNOSIS — Z9981 Dependence on supplemental oxygen: Secondary | ICD-10-CM | POA: Diagnosis not present

## 2012-12-13 DIAGNOSIS — G8110 Spastic hemiplegia affecting unspecified side: Secondary | ICD-10-CM | POA: Diagnosis not present

## 2012-12-13 DIAGNOSIS — I6350 Cerebral infarction due to unspecified occlusion or stenosis of unspecified cerebral artery: Secondary | ICD-10-CM | POA: Diagnosis not present

## 2012-12-13 DIAGNOSIS — G8250 Quadriplegia, unspecified: Secondary | ICD-10-CM | POA: Diagnosis not present

## 2012-12-13 DIAGNOSIS — I10 Essential (primary) hypertension: Secondary | ICD-10-CM | POA: Diagnosis not present

## 2012-12-13 DIAGNOSIS — G8100 Flaccid hemiplegia affecting unspecified side: Secondary | ICD-10-CM | POA: Diagnosis not present

## 2012-12-14 DIAGNOSIS — R1312 Dysphagia, oropharyngeal phase: Secondary | ICD-10-CM | POA: Diagnosis not present

## 2012-12-14 DIAGNOSIS — I69928 Other speech and language deficits following unspecified cerebrovascular disease: Secondary | ICD-10-CM | POA: Diagnosis not present

## 2012-12-14 DIAGNOSIS — E785 Hyperlipidemia, unspecified: Secondary | ICD-10-CM | POA: Diagnosis not present

## 2012-12-14 DIAGNOSIS — Z9981 Dependence on supplemental oxygen: Secondary | ICD-10-CM | POA: Diagnosis not present

## 2012-12-14 DIAGNOSIS — I10 Essential (primary) hypertension: Secondary | ICD-10-CM | POA: Diagnosis not present

## 2012-12-17 DIAGNOSIS — I69928 Other speech and language deficits following unspecified cerebrovascular disease: Secondary | ICD-10-CM | POA: Diagnosis not present

## 2012-12-17 DIAGNOSIS — E785 Hyperlipidemia, unspecified: Secondary | ICD-10-CM | POA: Diagnosis not present

## 2012-12-17 DIAGNOSIS — Z9981 Dependence on supplemental oxygen: Secondary | ICD-10-CM | POA: Diagnosis not present

## 2012-12-17 DIAGNOSIS — I10 Essential (primary) hypertension: Secondary | ICD-10-CM | POA: Diagnosis not present

## 2012-12-17 DIAGNOSIS — R1312 Dysphagia, oropharyngeal phase: Secondary | ICD-10-CM | POA: Diagnosis not present

## 2012-12-19 DIAGNOSIS — R1312 Dysphagia, oropharyngeal phase: Secondary | ICD-10-CM | POA: Diagnosis not present

## 2012-12-19 DIAGNOSIS — Z9981 Dependence on supplemental oxygen: Secondary | ICD-10-CM | POA: Diagnosis not present

## 2012-12-19 DIAGNOSIS — I10 Essential (primary) hypertension: Secondary | ICD-10-CM | POA: Diagnosis not present

## 2012-12-19 DIAGNOSIS — I69928 Other speech and language deficits following unspecified cerebrovascular disease: Secondary | ICD-10-CM | POA: Diagnosis not present

## 2012-12-19 DIAGNOSIS — E785 Hyperlipidemia, unspecified: Secondary | ICD-10-CM | POA: Diagnosis not present

## 2012-12-20 DIAGNOSIS — R0609 Other forms of dyspnea: Secondary | ICD-10-CM | POA: Diagnosis not present

## 2012-12-20 DIAGNOSIS — E785 Hyperlipidemia, unspecified: Secondary | ICD-10-CM | POA: Diagnosis not present

## 2012-12-20 DIAGNOSIS — Z79899 Other long term (current) drug therapy: Secondary | ICD-10-CM | POA: Diagnosis not present

## 2012-12-20 DIAGNOSIS — Z7982 Long term (current) use of aspirin: Secondary | ICD-10-CM | POA: Diagnosis not present

## 2012-12-20 DIAGNOSIS — Z9981 Dependence on supplemental oxygen: Secondary | ICD-10-CM | POA: Diagnosis not present

## 2012-12-20 DIAGNOSIS — I69959 Hemiplegia and hemiparesis following unspecified cerebrovascular disease affecting unspecified side: Secondary | ICD-10-CM | POA: Diagnosis not present

## 2012-12-20 DIAGNOSIS — J209 Acute bronchitis, unspecified: Secondary | ICD-10-CM | POA: Diagnosis not present

## 2012-12-20 DIAGNOSIS — Z7901 Long term (current) use of anticoagulants: Secondary | ICD-10-CM | POA: Diagnosis not present

## 2012-12-20 DIAGNOSIS — I10 Essential (primary) hypertension: Secondary | ICD-10-CM | POA: Diagnosis not present

## 2012-12-20 DIAGNOSIS — E7800 Pure hypercholesterolemia, unspecified: Secondary | ICD-10-CM | POA: Diagnosis not present

## 2012-12-20 DIAGNOSIS — R1312 Dysphagia, oropharyngeal phase: Secondary | ICD-10-CM | POA: Diagnosis not present

## 2012-12-20 DIAGNOSIS — I69928 Other speech and language deficits following unspecified cerebrovascular disease: Secondary | ICD-10-CM | POA: Diagnosis not present

## 2012-12-25 DIAGNOSIS — R499 Unspecified voice and resonance disorder: Secondary | ICD-10-CM | POA: Diagnosis not present

## 2012-12-25 DIAGNOSIS — I69991 Dysphagia following unspecified cerebrovascular disease: Secondary | ICD-10-CM | POA: Diagnosis not present

## 2012-12-25 DIAGNOSIS — J209 Acute bronchitis, unspecified: Secondary | ICD-10-CM | POA: Diagnosis not present

## 2012-12-25 DIAGNOSIS — IMO0001 Reserved for inherently not codable concepts without codable children: Secondary | ICD-10-CM | POA: Diagnosis not present

## 2012-12-25 DIAGNOSIS — R1312 Dysphagia, oropharyngeal phase: Secondary | ICD-10-CM | POA: Diagnosis not present

## 2012-12-25 DIAGNOSIS — I69928 Other speech and language deficits following unspecified cerebrovascular disease: Secondary | ICD-10-CM | POA: Diagnosis not present

## 2012-12-26 DIAGNOSIS — J3802 Paralysis of vocal cords and larynx, bilateral: Secondary | ICD-10-CM | POA: Diagnosis not present

## 2012-12-26 DIAGNOSIS — R490 Dysphonia: Secondary | ICD-10-CM | POA: Diagnosis not present

## 2012-12-27 DIAGNOSIS — R499 Unspecified voice and resonance disorder: Secondary | ICD-10-CM | POA: Diagnosis not present

## 2012-12-27 DIAGNOSIS — M25569 Pain in unspecified knee: Secondary | ICD-10-CM | POA: Diagnosis not present

## 2012-12-27 DIAGNOSIS — I69928 Other speech and language deficits following unspecified cerebrovascular disease: Secondary | ICD-10-CM | POA: Diagnosis not present

## 2012-12-27 DIAGNOSIS — M1710 Unilateral primary osteoarthritis, unspecified knee: Secondary | ICD-10-CM | POA: Diagnosis not present

## 2012-12-27 DIAGNOSIS — J209 Acute bronchitis, unspecified: Secondary | ICD-10-CM | POA: Diagnosis not present

## 2012-12-27 DIAGNOSIS — IMO0001 Reserved for inherently not codable concepts without codable children: Secondary | ICD-10-CM | POA: Diagnosis not present

## 2012-12-27 DIAGNOSIS — I69991 Dysphagia following unspecified cerebrovascular disease: Secondary | ICD-10-CM | POA: Diagnosis not present

## 2012-12-27 DIAGNOSIS — R1312 Dysphagia, oropharyngeal phase: Secondary | ICD-10-CM | POA: Diagnosis not present

## 2012-12-28 DIAGNOSIS — I10 Essential (primary) hypertension: Secondary | ICD-10-CM | POA: Diagnosis not present

## 2012-12-28 DIAGNOSIS — I69928 Other speech and language deficits following unspecified cerebrovascular disease: Secondary | ICD-10-CM | POA: Diagnosis not present

## 2012-12-28 DIAGNOSIS — IMO0001 Reserved for inherently not codable concepts without codable children: Secondary | ICD-10-CM | POA: Diagnosis not present

## 2012-12-28 DIAGNOSIS — R499 Unspecified voice and resonance disorder: Secondary | ICD-10-CM | POA: Diagnosis not present

## 2012-12-28 DIAGNOSIS — I69991 Dysphagia following unspecified cerebrovascular disease: Secondary | ICD-10-CM | POA: Diagnosis not present

## 2012-12-28 DIAGNOSIS — J209 Acute bronchitis, unspecified: Secondary | ICD-10-CM | POA: Diagnosis not present

## 2012-12-28 DIAGNOSIS — J9819 Other pulmonary collapse: Secondary | ICD-10-CM | POA: Diagnosis not present

## 2012-12-28 DIAGNOSIS — R1312 Dysphagia, oropharyngeal phase: Secondary | ICD-10-CM | POA: Diagnosis not present

## 2012-12-31 DIAGNOSIS — I69991 Dysphagia following unspecified cerebrovascular disease: Secondary | ICD-10-CM | POA: Diagnosis not present

## 2012-12-31 DIAGNOSIS — J209 Acute bronchitis, unspecified: Secondary | ICD-10-CM | POA: Diagnosis not present

## 2012-12-31 DIAGNOSIS — I69928 Other speech and language deficits following unspecified cerebrovascular disease: Secondary | ICD-10-CM | POA: Diagnosis not present

## 2012-12-31 DIAGNOSIS — R1312 Dysphagia, oropharyngeal phase: Secondary | ICD-10-CM | POA: Diagnosis not present

## 2012-12-31 DIAGNOSIS — R499 Unspecified voice and resonance disorder: Secondary | ICD-10-CM | POA: Diagnosis not present

## 2012-12-31 DIAGNOSIS — IMO0001 Reserved for inherently not codable concepts without codable children: Secondary | ICD-10-CM | POA: Diagnosis not present

## 2013-01-01 DIAGNOSIS — I10 Essential (primary) hypertension: Secondary | ICD-10-CM | POA: Diagnosis not present

## 2013-01-02 DIAGNOSIS — H25019 Cortical age-related cataract, unspecified eye: Secondary | ICD-10-CM | POA: Diagnosis not present

## 2013-01-02 DIAGNOSIS — H269 Unspecified cataract: Secondary | ICD-10-CM | POA: Diagnosis not present

## 2013-01-02 DIAGNOSIS — H2510 Age-related nuclear cataract, unspecified eye: Secondary | ICD-10-CM | POA: Diagnosis not present

## 2013-01-09 DIAGNOSIS — H269 Unspecified cataract: Secondary | ICD-10-CM | POA: Diagnosis not present

## 2013-01-09 DIAGNOSIS — H2510 Age-related nuclear cataract, unspecified eye: Secondary | ICD-10-CM | POA: Diagnosis not present

## 2013-01-14 DIAGNOSIS — J209 Acute bronchitis, unspecified: Secondary | ICD-10-CM | POA: Diagnosis not present

## 2013-01-14 DIAGNOSIS — IMO0001 Reserved for inherently not codable concepts without codable children: Secondary | ICD-10-CM | POA: Diagnosis not present

## 2013-01-14 DIAGNOSIS — R490 Dysphonia: Secondary | ICD-10-CM | POA: Diagnosis not present

## 2013-01-14 DIAGNOSIS — I69928 Other speech and language deficits following unspecified cerebrovascular disease: Secondary | ICD-10-CM | POA: Diagnosis not present

## 2013-01-14 DIAGNOSIS — R499 Unspecified voice and resonance disorder: Secondary | ICD-10-CM | POA: Diagnosis not present

## 2013-01-14 DIAGNOSIS — I69991 Dysphagia following unspecified cerebrovascular disease: Secondary | ICD-10-CM | POA: Diagnosis not present

## 2013-01-14 DIAGNOSIS — R1312 Dysphagia, oropharyngeal phase: Secondary | ICD-10-CM | POA: Diagnosis not present

## 2013-01-16 DIAGNOSIS — R490 Dysphonia: Secondary | ICD-10-CM | POA: Diagnosis not present

## 2013-01-16 DIAGNOSIS — Z23 Encounter for immunization: Secondary | ICD-10-CM | POA: Diagnosis not present

## 2013-01-24 DIAGNOSIS — G8110 Spastic hemiplegia affecting unspecified side: Secondary | ICD-10-CM | POA: Diagnosis not present

## 2013-01-28 DIAGNOSIS — B029 Zoster without complications: Secondary | ICD-10-CM | POA: Diagnosis not present

## 2013-02-15 DIAGNOSIS — E782 Mixed hyperlipidemia: Secondary | ICD-10-CM | POA: Diagnosis not present

## 2013-02-15 DIAGNOSIS — Z125 Encounter for screening for malignant neoplasm of prostate: Secondary | ICD-10-CM | POA: Diagnosis not present

## 2013-02-15 DIAGNOSIS — R1310 Dysphagia, unspecified: Secondary | ICD-10-CM | POA: Diagnosis not present

## 2013-02-15 DIAGNOSIS — R42 Dizziness and giddiness: Secondary | ICD-10-CM | POA: Diagnosis not present

## 2013-02-22 DIAGNOSIS — J209 Acute bronchitis, unspecified: Secondary | ICD-10-CM | POA: Diagnosis not present

## 2013-02-22 DIAGNOSIS — R1312 Dysphagia, oropharyngeal phase: Secondary | ICD-10-CM | POA: Diagnosis not present

## 2013-02-22 DIAGNOSIS — I69928 Other speech and language deficits following unspecified cerebrovascular disease: Secondary | ICD-10-CM | POA: Diagnosis not present

## 2013-02-22 DIAGNOSIS — R499 Unspecified voice and resonance disorder: Secondary | ICD-10-CM | POA: Diagnosis not present

## 2013-02-22 DIAGNOSIS — IMO0001 Reserved for inherently not codable concepts without codable children: Secondary | ICD-10-CM | POA: Diagnosis not present

## 2013-02-22 DIAGNOSIS — I69991 Dysphagia following unspecified cerebrovascular disease: Secondary | ICD-10-CM | POA: Diagnosis not present

## 2013-03-26 DIAGNOSIS — K9423 Gastrostomy malfunction: Secondary | ICD-10-CM | POA: Diagnosis not present

## 2013-03-27 ENCOUNTER — Other Ambulatory Visit (HOSPITAL_COMMUNITY): Payer: Self-pay | Admitting: Family Medicine

## 2013-03-27 DIAGNOSIS — R1310 Dysphagia, unspecified: Secondary | ICD-10-CM

## 2013-03-29 ENCOUNTER — Ambulatory Visit (HOSPITAL_COMMUNITY)
Admission: RE | Admit: 2013-03-29 | Discharge: 2013-03-29 | Disposition: A | Discharge status: Home / Self Care | Payer: Medicare Other | Source: Ambulatory Visit | Attending: Family Medicine | Admitting: Family Medicine

## 2013-03-29 DIAGNOSIS — R1310 Dysphagia, unspecified: Secondary | ICD-10-CM | POA: Insufficient documentation

## 2013-03-29 DIAGNOSIS — R1312 Dysphagia, oropharyngeal phase: Secondary | ICD-10-CM | POA: Diagnosis not present

## 2013-03-29 DIAGNOSIS — R1311 Dysphagia, oral phase: Secondary | ICD-10-CM | POA: Insufficient documentation

## 2013-03-29 NOTE — Procedures (Signed)
Objective Swallowing Evaluation: Modified Barium Swallowing Study  Patient Details  Name: Dennis Delgado MRN: [id number] Date of Birth: 04-03-1946  Today's Date: 03/29/2013 Time: 1100-1145 SLP Time Calculation (min): 45 min  Past Medical History: No past medical history on file. Past Surgical History: No past surgical history on file. HPI:  67 y/o wm arriving fror an outpatient MBS with wife and daughter. Pt currently lives at home, but was recently d/c'd from SNF on nectar thick liquids and dys 2 diet. Pt still has a PEG, family reports PEG will need to either be removed or replaced soon and swallow eval requested to determine if pt still needs alternate nutrition. Family reports pt has not had any pna since d/c from [facility]. History includes admission to [facility] 10-05-11 with medullary infarct and aspiration that necessitated vent/peg/abx with trach 10/19/11 and after 2 weeks there was transferred to [facility] 6/28 for liberation from vent an tx of aspiration pna.     Assessment / Plan / Recommendation Clinical Impression  Dysphagia Diagnosis: Severe pharyngeal phase dysphagia;Mild oral phase dysphagia Clinical impression: Pt presents with a continued severe neuromuscular oropharygneal dysphagia with high aspiration risk. Pts oral phase is mildly impaired with some pooling of oral redisuals post swallow with anterior spillage. Pharyngeal phase is severe due to continued poor strength and mobility of base of tongue, hyolaryngeal complex and pharyngeal peristalsis. There is incomplete airway protection and severe residuals remaining post swallow which are grossly, silently aspirated durng and after the swallow. The pt is not able to expel aspirate due to inability to achieve adduction for cough. A cued throat clear removes a portion of tracheal aspirate. A chin tuck was very effective in increasing airway protection during the swallow and facilitating pharyngeal peristalsis to decrease residuals. The  pt has decreased trunk control and was almost inverted while performing chin tuck, which decreased passage of bolus through cervical esophagus. Returning upright intermittently and alternating solids and liquids improved esophageal transit. The pt is recommended to continue nectar thick liquids and soft, moist solids with full supervision for a chin tuck with all POs. Would not recommend removal of PEG tube as pt is at continued risk for aspiration and any acute illness is likely lead to significant functional decline in swallow, perhaps necessitating alternate nutrition in the future. Pt will need f/u with SLP for dysphagia, speech and voice.     Treatment Recommendation  Defer treatment plan to SLP at (Comment) (outpatient/homehealth)    Diet Recommendation Dysphagia 2 (Fine chop);Nectar-thick liquid   Liquid Administration via: Straw Medication Administration: Whole meds with puree Supervision: Trained caregiver to feed patient Compensations: Slow rate;Small sips/bites;Multiple dry swallows after each bite/sip;Follow solids with liquid Postural Changes and/or Swallow Maneuvers: Chin tuck;Seated upright 90 degrees;Out of bed for meals    Other  Recommendations     Follow Up Recommendations  Outpatient SLP    Frequency and Duration        Pertinent Vitals/Pain NA    SLP Swallow Goals     General HPI: 67 y/o wm arriving fror an outpatient MBS with wife and daughter. Pt currently lives at home, but was recently d/cd from SNF on nectar thick liquids and dys 2 diet. Pt still has a PEG, family reports PEG will need to either be removed or replaced soon and swallow eval requented to determine if pt still needs alternate nutrition. Family reports pt has not had any pna since d/c from [facility]. History includes admission to [facility] 10-05-11 with medullary infarct  and aspiration that necessitated vent/peg/abx with trach 10/19/11 and after 2 weeks there was transferred to [facility] 6/28 for liberation  from vent an tx of aspiration pna. Type of Study: Modified Barium Swallowing Study Reason for Referral: Objectively evaluate swallowing function Previous Swallow Assessment: 2013, record not available, done by [facility].  Diet Prior to this Study: Dysphagia 2 (chopped);Nectar-thick liquids Temperature Spikes Noted: No Respiratory Status: Room air History of Recent Intubation: No Behavior/Cognition: Alert;Cooperative;Pleasant mood Oral Cavity - Dentition: Adequate natural dentition Oral Motor / Sensory Function: Impaired motor (base of tongue elevation) Self-Feeding Abilities: Total assist Patient Positioning: Upright in chair Baseline Vocal Quality: Aphonic (phonates only with forceful exhalation) Volitional Cough: Weak (unable to achieve adduction for explosive cough) Volitional Swallow: Able to elicit Anatomy: Within functional limits    Reason for Referral Objectively evaluate swallowing function   Oral Phase     Pharyngeal Phase Pharyngeal Phase Pharyngeal Phase: Impaired Pharyngeal - Honey Pharyngeal - Honey Teaspoon: Delayed swallow initiation;Premature spillage to valleculae;Reduced pharyngeal peristalsis;Reduced epiglottic inversion;Reduced anterior laryngeal mobility;Reduced laryngeal elevation;Reduced airway/laryngeal closure;Reduced tongue base retraction;Penetration/Aspiration after swallow;Penetration/Aspiration during swallow;Moderate aspiration;Pharyngeal residue - valleculae;Pharyngeal residue - pyriform sinuses Penetration/Aspiration details (honey teaspoon): Material enters airway, passes BELOW cords without attempt by patient to eject out (silent aspiration) Pharyngeal - Nectar Pharyngeal - Nectar Straw: Delayed swallow initiation;Premature spillage to valleculae;Reduced pharyngeal peristalsis;Reduced epiglottic inversion;Reduced anterior laryngeal mobility;Reduced laryngeal elevation;Reduced airway/laryngeal closure;Reduced tongue base  retraction;Penetration/Aspiration after swallow;Penetration/Aspiration during swallow;Pharyngeal residue - valleculae;Pharyngeal residue - pyriform sinuses;Significant aspiration (Amount);Compensatory strategies attempted (Comment) (chin tuck) Penetration/Aspiration details (nectar straw): Material enters airway, passes BELOW cords without attempt by patient to eject out (silent aspiration);Material does not enter airway Pharyngeal - Thin Pharyngeal - Thin Straw: Delayed swallow initiation;Premature spillage to valleculae;Reduced pharyngeal peristalsis;Reduced epiglottic inversion;Reduced anterior laryngeal mobility;Reduced laryngeal elevation;Reduced airway/laryngeal closure;Reduced tongue base retraction;Penetration/Aspiration after swallow;Penetration/Aspiration during swallow;Pharyngeal residue - valleculae;Pharyngeal residue - pyriform sinuses;Significant aspiration (Amount);Compensatory strategies attempted (Comment) (chin tuck, aspiration of residual still occured post swallow) Penetration/Aspiration details (thin straw): Material enters airway, passes BELOW cords without attempt by patient to eject out (silent aspiration) Pharyngeal - Solids Pharyngeal - Puree: Delayed swallow initiation;Premature spillage to valleculae;Reduced pharyngeal peristalsis;Reduced epiglottic inversion;Reduced anterior laryngeal mobility;Reduced laryngeal elevation;Reduced airway/laryngeal closure;Reduced tongue base retraction;Penetration/Aspiration after swallow;Penetration/Aspiration during swallow;Pharyngeal residue - valleculae;Pharyngeal residue - pyriform sinuses;Significant aspiration (Amount);Compensatory strategies attempted (Comment) (chin tuck) Penetration/Aspiration details (puree): Material does not enter airway;Material enters airway, CONTACTS cords and not ejected out Pharyngeal - Regular: Delayed swallow initiation;Reduced pharyngeal peristalsis;Reduced epiglottic inversion;Reduced anterior laryngeal  mobility;Reduced laryngeal elevation;Reduced airway/laryngeal closure;Reduced tongue base retraction;Pharyngeal residue - valleculae;Pharyngeal residue - pyriform sinuses (chin tuck) Pharyngeal - Pill: Delayed swallow initiation;Premature spillage to valleculae;Reduced pharyngeal peristalsis;Reduced epiglottic inversion;Reduced anterior laryngeal mobility;Reduced laryngeal elevation;Reduced airway/laryngeal closure;Reduced tongue base retraction;Pharyngeal residue - valleculae;Pharyngeal residue - pyriform sinuses;Compensatory strategies attempted (Comment) (chin tuck) Penetration/Aspiration details (pill): Material does not enter airway  Cervical Esophageal Phase    GO    Cervical Esophageal Phase Cervical Esophageal Phase: Impaired Cervical Esophageal Phase - Comment Cervical Esophageal Comment: Solid stasis in cervical esopahgus, clears alternating liquids and solids.         Harlon Ditty, MA CCC-SLP [phone]  Claudine Mouton 03/29/2013, 2:19 PM

## 2013-04-10 DIAGNOSIS — K9423 Gastrostomy malfunction: Secondary | ICD-10-CM | POA: Diagnosis not present

## 2013-04-16 DIAGNOSIS — I69991 Dysphagia following unspecified cerebrovascular disease: Secondary | ICD-10-CM | POA: Diagnosis not present

## 2013-04-16 DIAGNOSIS — R1312 Dysphagia, oropharyngeal phase: Secondary | ICD-10-CM | POA: Diagnosis not present

## 2013-04-16 DIAGNOSIS — K9423 Gastrostomy malfunction: Secondary | ICD-10-CM | POA: Diagnosis not present

## 2013-04-30 DIAGNOSIS — I10 Essential (primary) hypertension: Secondary | ICD-10-CM | POA: Diagnosis not present

## 2013-04-30 DIAGNOSIS — I672 Cerebral atherosclerosis: Secondary | ICD-10-CM | POA: Diagnosis not present

## 2013-04-30 DIAGNOSIS — E782 Mixed hyperlipidemia: Secondary | ICD-10-CM | POA: Diagnosis not present

## 2013-04-30 DIAGNOSIS — R5381 Other malaise: Secondary | ICD-10-CM | POA: Diagnosis not present

## 2013-04-30 DIAGNOSIS — E559 Vitamin D deficiency, unspecified: Secondary | ICD-10-CM | POA: Diagnosis not present

## 2013-04-30 DIAGNOSIS — R5383 Other fatigue: Secondary | ICD-10-CM | POA: Diagnosis not present

## 2013-05-16 NOTE — Progress Notes (Signed)
03/29/13 1300  SLP G-Codes **NOT FOR INPATIENT CLASS**  Functional Assessment Tool Used clinical judgement  Functional Limitations Swallowing  Swallow Current Status (C3762) CL  Swallow Goal Status (G3151) CL  Swallow Discharge Status (V6160) CL  SLP Evaluations  $ SLP Speech Visit 1 Procedure  SLP Evaluations  $Swallowing Treatment 1 Procedure  $MBS Swallow Outpatient 1 Procedure

## 2013-05-16 NOTE — Addendum Note (Signed)
Encounter addended by: Katherene Ponto Tamatha Gadbois, CCC-SLP on: 05/16/2013  3:57 PM<BR>     Documentation filed: Clinical Notes, Inpatient Document Flowsheet

## 2013-05-30 DIAGNOSIS — E559 Vitamin D deficiency, unspecified: Secondary | ICD-10-CM | POA: Diagnosis not present

## 2013-06-26 DIAGNOSIS — G4734 Idiopathic sleep related nonobstructive alveolar hypoventilation: Secondary | ICD-10-CM | POA: Diagnosis not present

## 2013-06-26 DIAGNOSIS — R1310 Dysphagia, unspecified: Secondary | ICD-10-CM | POA: Diagnosis not present

## 2013-06-26 DIAGNOSIS — I6350 Cerebral infarction due to unspecified occlusion or stenosis of unspecified cerebral artery: Secondary | ICD-10-CM | POA: Diagnosis not present

## 2013-06-26 DIAGNOSIS — G8100 Flaccid hemiplegia affecting unspecified side: Secondary | ICD-10-CM | POA: Diagnosis not present

## 2013-07-10 DIAGNOSIS — R1312 Dysphagia, oropharyngeal phase: Secondary | ICD-10-CM | POA: Diagnosis not present

## 2013-07-10 DIAGNOSIS — IMO0001 Reserved for inherently not codable concepts without codable children: Secondary | ICD-10-CM | POA: Diagnosis not present

## 2013-07-18 DIAGNOSIS — IMO0001 Reserved for inherently not codable concepts without codable children: Secondary | ICD-10-CM | POA: Diagnosis not present

## 2013-07-18 DIAGNOSIS — R1312 Dysphagia, oropharyngeal phase: Secondary | ICD-10-CM | POA: Diagnosis not present

## 2013-07-23 DIAGNOSIS — IMO0001 Reserved for inherently not codable concepts without codable children: Secondary | ICD-10-CM | POA: Diagnosis not present

## 2013-07-23 DIAGNOSIS — R1312 Dysphagia, oropharyngeal phase: Secondary | ICD-10-CM | POA: Diagnosis not present

## 2013-07-26 DIAGNOSIS — IMO0001 Reserved for inherently not codable concepts without codable children: Secondary | ICD-10-CM | POA: Diagnosis not present

## 2013-07-26 DIAGNOSIS — R1312 Dysphagia, oropharyngeal phase: Secondary | ICD-10-CM | POA: Diagnosis not present

## 2013-07-31 DIAGNOSIS — R1312 Dysphagia, oropharyngeal phase: Secondary | ICD-10-CM | POA: Diagnosis not present

## 2013-07-31 DIAGNOSIS — IMO0001 Reserved for inherently not codable concepts without codable children: Secondary | ICD-10-CM | POA: Diagnosis not present

## 2013-08-07 DIAGNOSIS — IMO0001 Reserved for inherently not codable concepts without codable children: Secondary | ICD-10-CM | POA: Diagnosis not present

## 2013-08-07 DIAGNOSIS — R1312 Dysphagia, oropharyngeal phase: Secondary | ICD-10-CM | POA: Diagnosis not present

## 2013-08-13 DIAGNOSIS — R1312 Dysphagia, oropharyngeal phase: Secondary | ICD-10-CM | POA: Diagnosis not present

## 2013-08-13 DIAGNOSIS — IMO0001 Reserved for inherently not codable concepts without codable children: Secondary | ICD-10-CM | POA: Diagnosis not present

## 2013-08-19 DIAGNOSIS — I69959 Hemiplegia and hemiparesis following unspecified cerebrovascular disease affecting unspecified side: Secondary | ICD-10-CM | POA: Diagnosis not present

## 2013-08-19 DIAGNOSIS — E7800 Pure hypercholesterolemia, unspecified: Secondary | ICD-10-CM | POA: Diagnosis not present

## 2013-08-19 DIAGNOSIS — R0602 Shortness of breath: Secondary | ICD-10-CM | POA: Diagnosis not present

## 2013-08-19 DIAGNOSIS — Z79899 Other long term (current) drug therapy: Secondary | ICD-10-CM | POA: Diagnosis not present

## 2013-08-19 DIAGNOSIS — I10 Essential (primary) hypertension: Secondary | ICD-10-CM | POA: Diagnosis not present

## 2013-08-19 DIAGNOSIS — K219 Gastro-esophageal reflux disease without esophagitis: Secondary | ICD-10-CM | POA: Diagnosis not present

## 2013-08-19 DIAGNOSIS — J9819 Other pulmonary collapse: Secondary | ICD-10-CM | POA: Diagnosis not present

## 2013-08-19 DIAGNOSIS — R0609 Other forms of dyspnea: Secondary | ICD-10-CM | POA: Diagnosis not present

## 2013-08-21 DIAGNOSIS — IMO0001 Reserved for inherently not codable concepts without codable children: Secondary | ICD-10-CM | POA: Diagnosis not present

## 2013-08-21 DIAGNOSIS — R1312 Dysphagia, oropharyngeal phase: Secondary | ICD-10-CM | POA: Diagnosis not present

## 2013-08-23 DIAGNOSIS — R1312 Dysphagia, oropharyngeal phase: Secondary | ICD-10-CM | POA: Diagnosis not present

## 2013-08-23 DIAGNOSIS — L408 Other psoriasis: Secondary | ICD-10-CM | POA: Diagnosis not present

## 2013-08-23 DIAGNOSIS — R0902 Hypoxemia: Secondary | ICD-10-CM | POA: Diagnosis not present

## 2013-08-26 DIAGNOSIS — IMO0001 Reserved for inherently not codable concepts without codable children: Secondary | ICD-10-CM | POA: Diagnosis not present

## 2013-08-26 DIAGNOSIS — R1312 Dysphagia, oropharyngeal phase: Secondary | ICD-10-CM | POA: Diagnosis not present

## 2013-08-27 DIAGNOSIS — R1312 Dysphagia, oropharyngeal phase: Secondary | ICD-10-CM | POA: Diagnosis not present

## 2013-08-27 DIAGNOSIS — IMO0001 Reserved for inherently not codable concepts without codable children: Secondary | ICD-10-CM | POA: Diagnosis not present

## 2013-08-30 DIAGNOSIS — R1312 Dysphagia, oropharyngeal phase: Secondary | ICD-10-CM | POA: Diagnosis not present

## 2013-08-30 DIAGNOSIS — IMO0001 Reserved for inherently not codable concepts without codable children: Secondary | ICD-10-CM | POA: Diagnosis not present

## 2013-09-02 DIAGNOSIS — R1312 Dysphagia, oropharyngeal phase: Secondary | ICD-10-CM | POA: Diagnosis not present

## 2013-09-02 DIAGNOSIS — IMO0001 Reserved for inherently not codable concepts without codable children: Secondary | ICD-10-CM | POA: Diagnosis not present

## 2013-09-03 DIAGNOSIS — IMO0001 Reserved for inherently not codable concepts without codable children: Secondary | ICD-10-CM | POA: Diagnosis not present

## 2013-09-03 DIAGNOSIS — R1312 Dysphagia, oropharyngeal phase: Secondary | ICD-10-CM | POA: Diagnosis not present

## 2013-09-06 DIAGNOSIS — IMO0001 Reserved for inherently not codable concepts without codable children: Secondary | ICD-10-CM | POA: Diagnosis not present

## 2013-09-06 DIAGNOSIS — R1312 Dysphagia, oropharyngeal phase: Secondary | ICD-10-CM | POA: Diagnosis not present

## 2013-09-08 DIAGNOSIS — R1312 Dysphagia, oropharyngeal phase: Secondary | ICD-10-CM | POA: Diagnosis not present

## 2013-09-08 DIAGNOSIS — M531 Cervicobrachial syndrome: Secondary | ICD-10-CM | POA: Diagnosis not present

## 2013-09-08 DIAGNOSIS — J690 Pneumonitis due to inhalation of food and vomit: Secondary | ICD-10-CM | POA: Diagnosis not present

## 2013-09-08 DIAGNOSIS — I69959 Hemiplegia and hemiparesis following unspecified cerebrovascular disease affecting unspecified side: Secondary | ICD-10-CM | POA: Diagnosis not present

## 2013-09-08 DIAGNOSIS — I69991 Dysphagia following unspecified cerebrovascular disease: Secondary | ICD-10-CM | POA: Diagnosis not present

## 2013-09-10 IMAGING — CR DG ABD PORTABLE 1V
1 series · 1 of 1 positions shown · non-contrast
Comparison: Portable exam 8800 hours without priors for comparison

CLINICAL DATA: Peg tube placement

PORTABLE ABDOMEN - 1 VIEW

[AP]
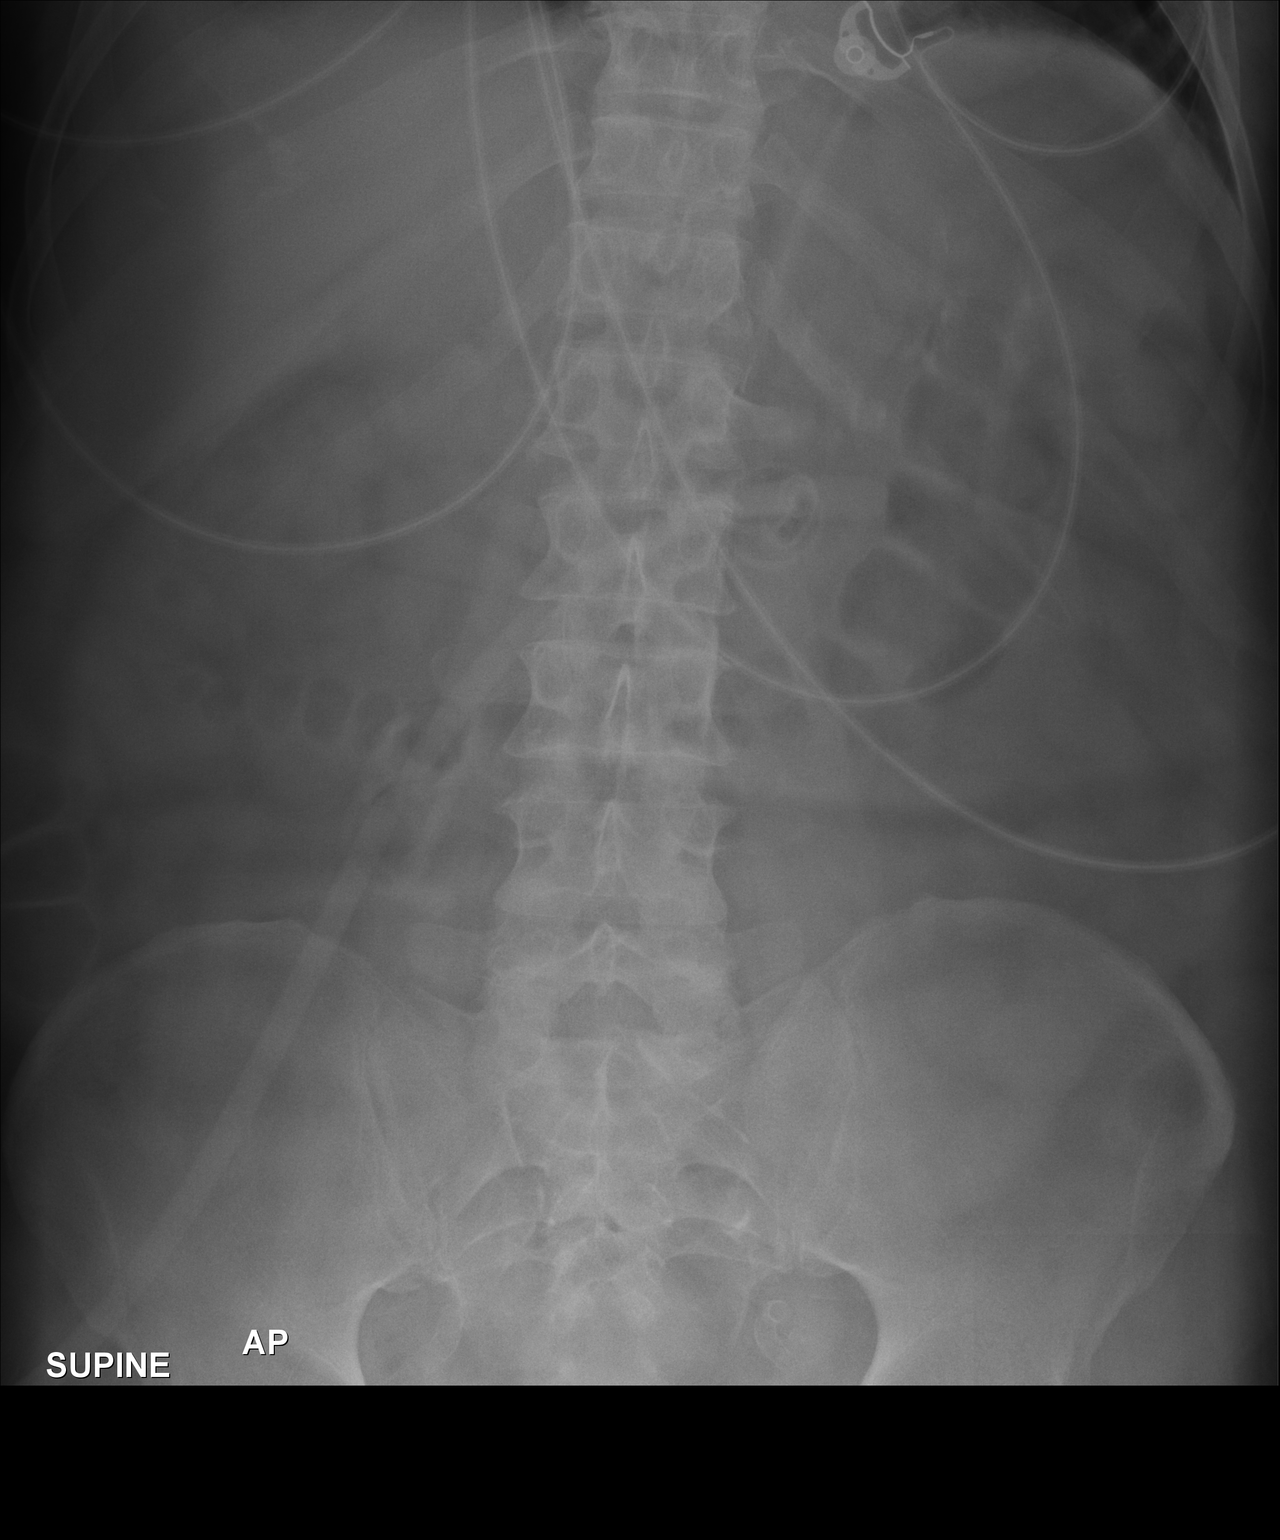

[1 of 1 positions shown; findings below may reference images not displayed]

FINDINGS: .
Gastrostomy tube projects over the expected position of the mid
stomach.
Normal bowel gas pattern.
Osseous structures unremarkable.
Vascular calcifications in pelvis.
No urinary tract calcifications.
IMPRESSION: Gastrostomy tube projects over the expected position of the mid
stomach.

## 2013-09-11 IMAGING — CR DG CHEST 1V PORT
1 series · 1 of 1 positions shown · non-contrast
Comparison: None.

CLINICAL DATA: VDRF

PORTABLE CHEST - 1 VIEW

[AP]
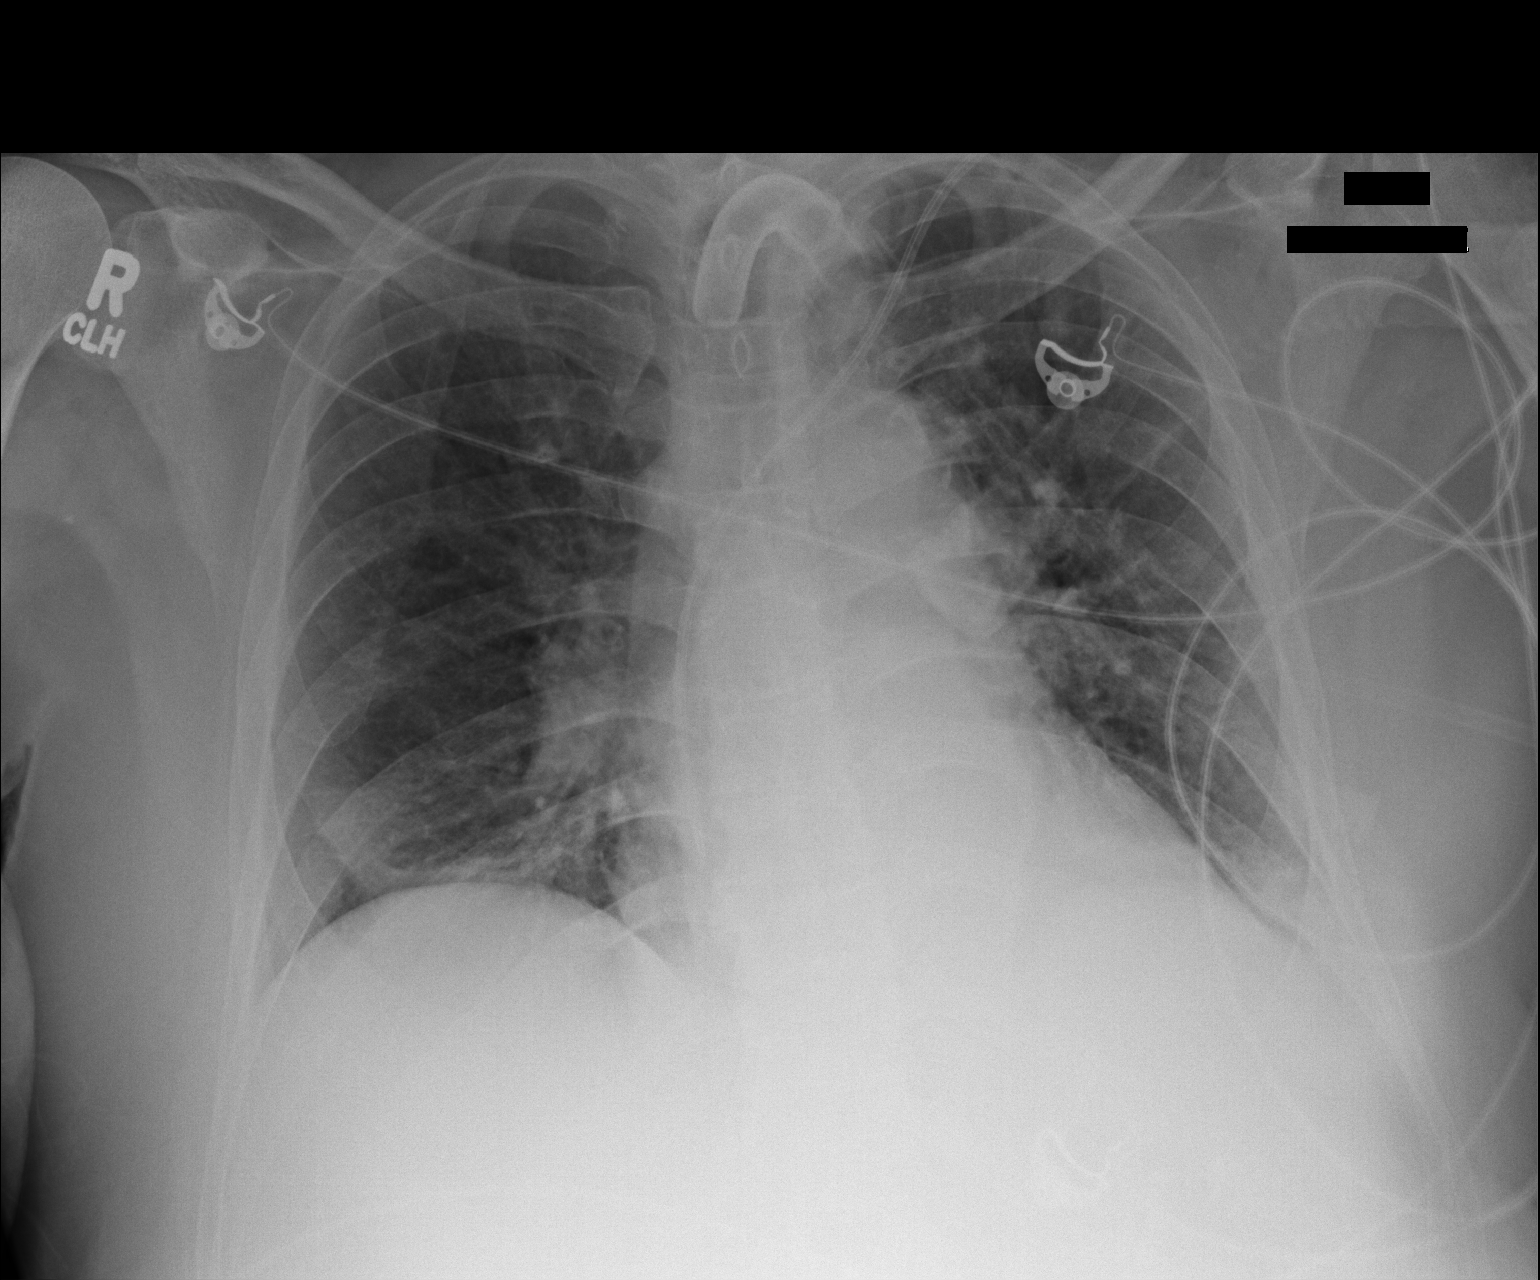

[1 of 1 positions shown; findings below may reference images not displayed]

FINDINGS: Mild bilateral lower lobe opacities, possibly
atelectasis.  Chronic interstitial markings/emphysematous changes.
No pleural effusion or pneumothorax.

Mild cardiomegaly.

Tracheostomy.  Left IJ venous catheter with its tip just below the
cavoatrial junction.
IMPRESSION: Mild bilateral lower lobe opacities, possibly atelectasis.

## 2013-09-13 IMAGING — CR DG CHEST 1V PORT
1 series · 1 of 1 positions shown · non-contrast
Comparison: Portable exam 8508 hours compared to 10/22/2011

CLINICAL DATA: Tracheostomy, stroke, shortness of breath,
aspiration

PORTABLE CHEST - 1 VIEW

[AP]
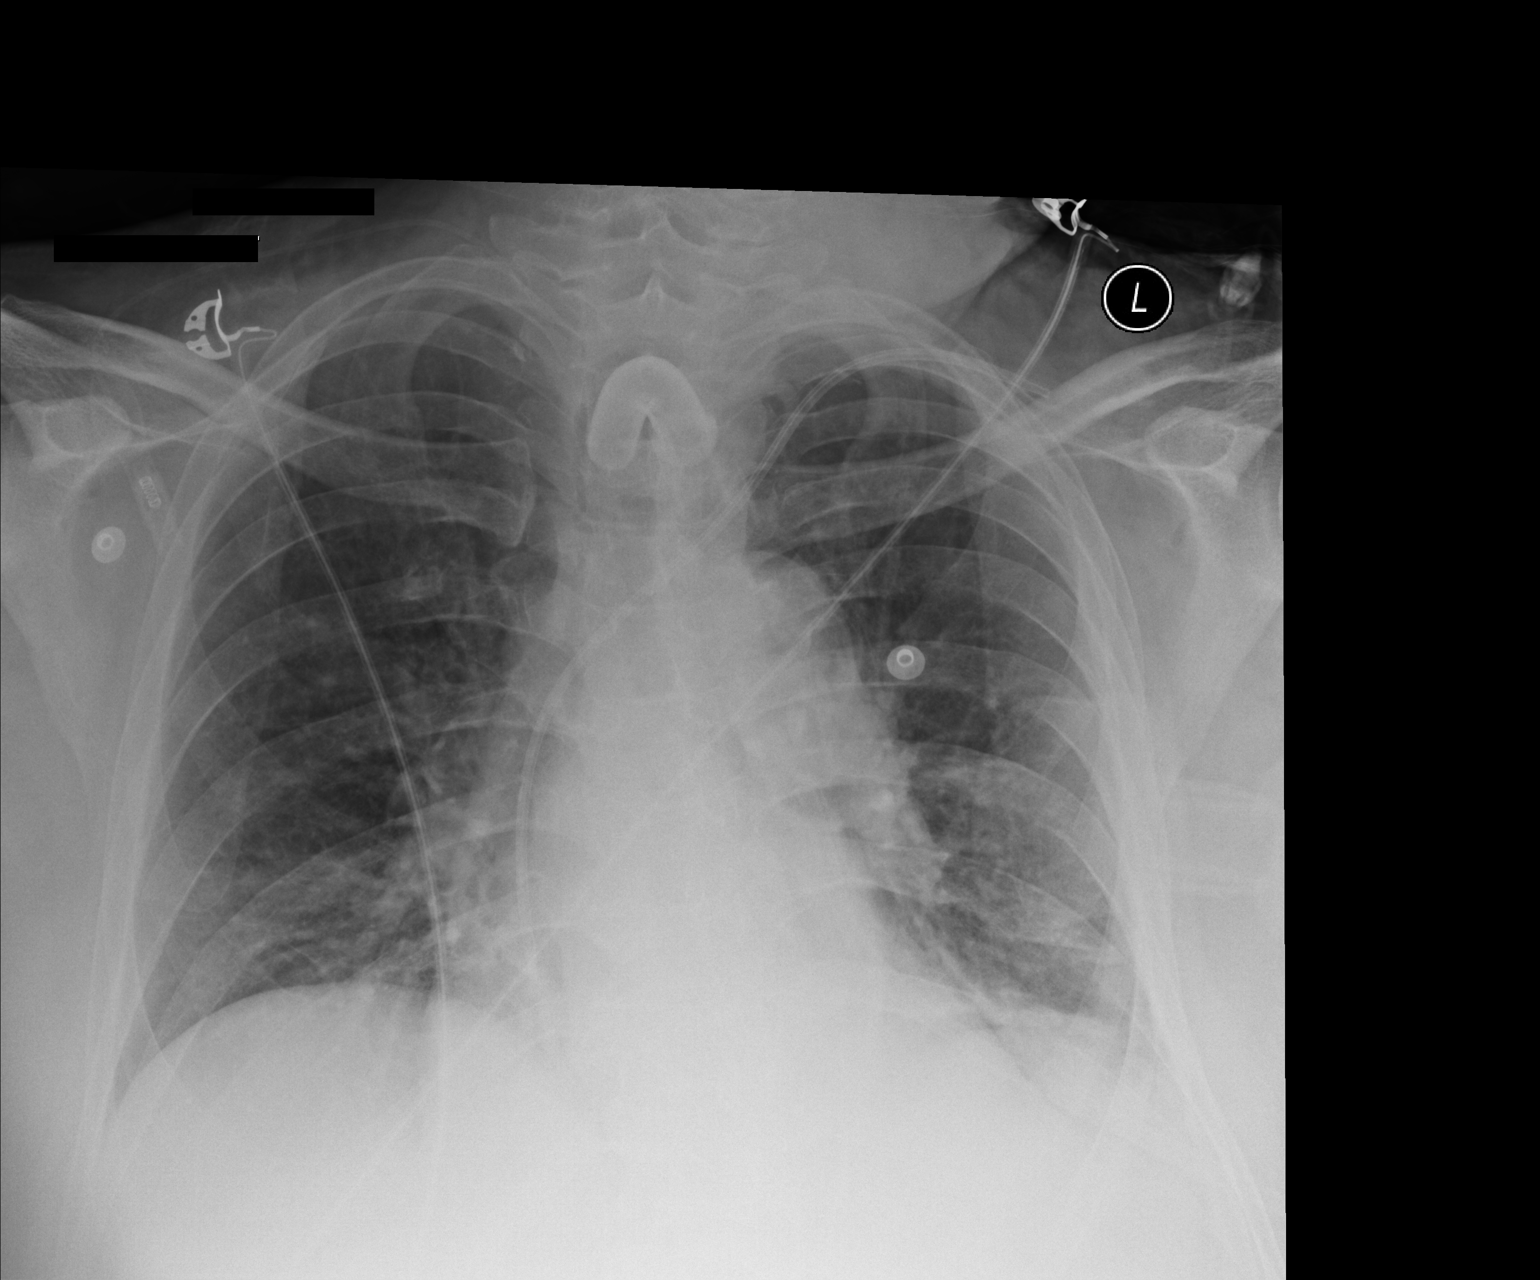

[1 of 1 positions shown; findings below may reference images not displayed]

FINDINGS: Tracheostomy tube stable.
Tip of left subclavian line projects over high right atrium though
this could be accentuated by positioning.
Enlargement of cardiac silhouette.
Tortuous aorta.
Bibasilar atelectasis.
Upper lungs clear.
No pleural effusion or pneumothorax.
IMPRESSION: Bibasilar atelectasis.

## 2013-09-17 DIAGNOSIS — R059 Cough, unspecified: Secondary | ICD-10-CM | POA: Diagnosis not present

## 2013-09-17 DIAGNOSIS — R05 Cough: Secondary | ICD-10-CM | POA: Diagnosis not present

## 2013-10-01 IMAGING — CR DG CHEST 1V PORT
1 series · 1 of 1 positions shown · non-contrast
Comparison: 10/24/2011

CLINICAL DATA: Tracheostomy.  Stroke.  Tachycardia.  Cough.

PORTABLE CHEST - 1 VIEW

[AP]
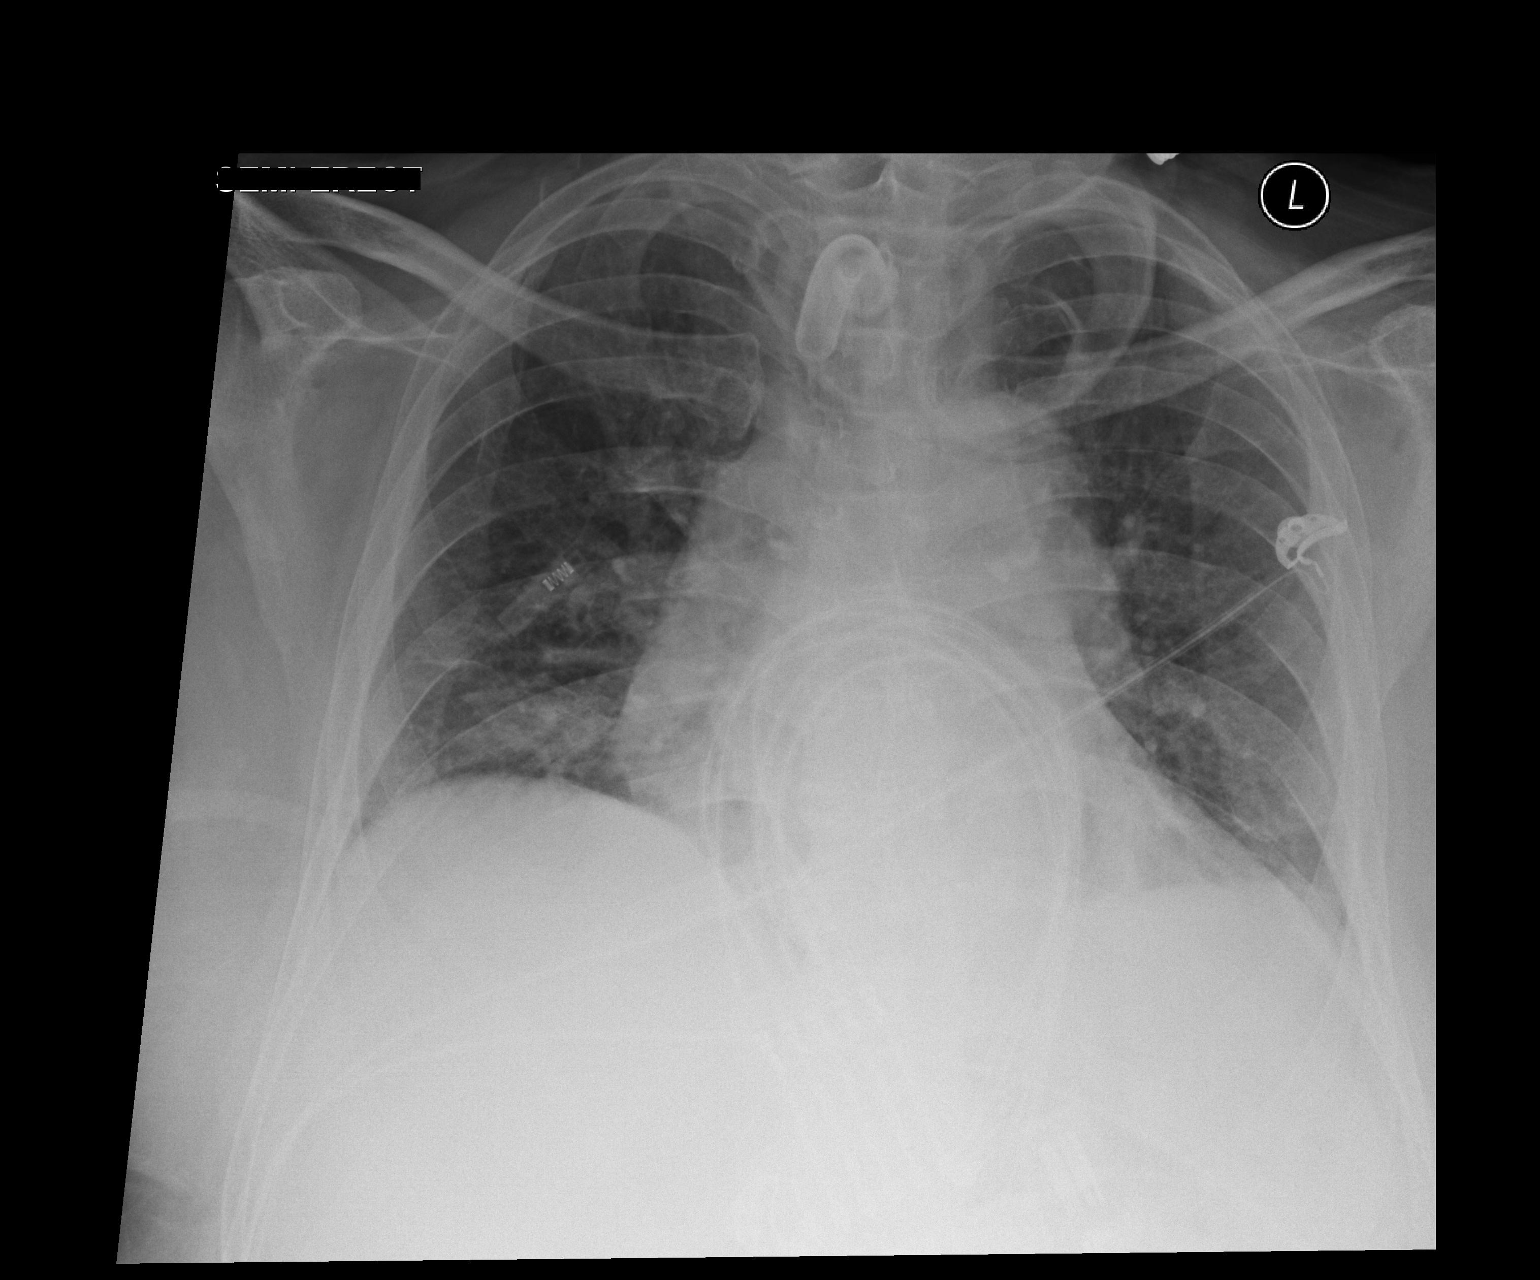

[1 of 1 positions shown; findings below may reference images not displayed]

FINDINGS: Tracheostomy remains in place.  Artifact overlies chest.
The upper lungs are clear.  There is patchy density in both lung
bases, similar to the previous study.  This could be atelectasis or
mild basilar pneumonia.  No heart failure or effusion.
IMPRESSION: Mild patchy density at the bases that could be atelectasis or mild
basilar pneumonia.  Similar appearance to the study of 10/24/2011.

## 2013-10-02 IMAGING — CR DG CHEST 1V PORT
1 series · 1 of 1 positions shown · non-contrast
Comparison: [DATE] and 10/24/2011

CLINICAL DATA: Respiratory failure.  Cough and chest congestion.

PORTABLE CHEST - 1 VIEW

[AP]
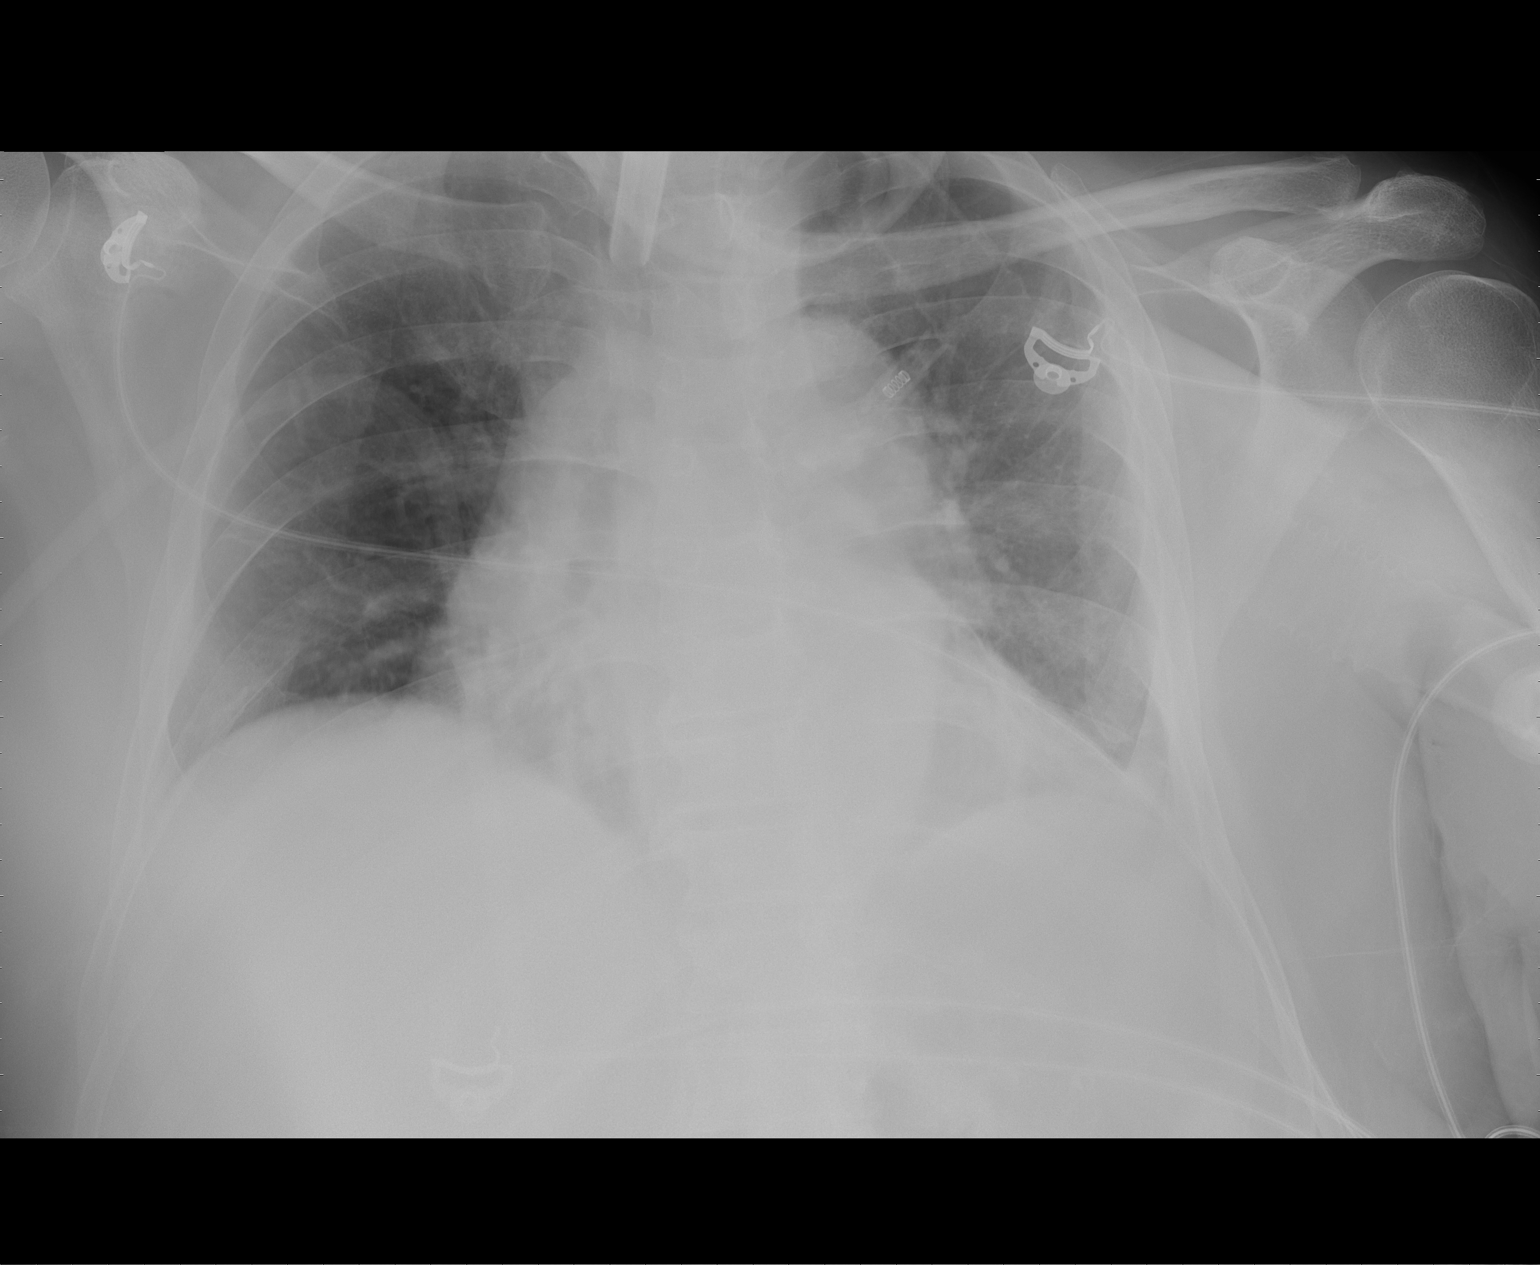

[1 of 1 positions shown; findings below may reference images not displayed]

FINDINGS: Tracheostomy tube appears in good position.  Heart size
and pulmonary vascularity are normal and the lungs are clear.
Tortuosity of the thoracic aorta.
IMPRESSION: No acute abnormalities.

## 2013-10-14 DIAGNOSIS — R112 Nausea with vomiting, unspecified: Secondary | ICD-10-CM | POA: Diagnosis not present

## 2013-10-14 DIAGNOSIS — K828 Other specified diseases of gallbladder: Secondary | ICD-10-CM | POA: Diagnosis not present

## 2013-10-14 DIAGNOSIS — J9819 Other pulmonary collapse: Secondary | ICD-10-CM | POA: Diagnosis not present

## 2013-10-14 DIAGNOSIS — R072 Precordial pain: Secondary | ICD-10-CM | POA: Diagnosis not present

## 2013-10-14 DIAGNOSIS — J690 Pneumonitis due to inhalation of food and vomit: Secondary | ICD-10-CM | POA: Diagnosis not present

## 2013-10-15 DIAGNOSIS — R6520 Severe sepsis without septic shock: Secondary | ICD-10-CM | POA: Diagnosis present

## 2013-10-15 DIAGNOSIS — K219 Gastro-esophageal reflux disease without esophagitis: Secondary | ICD-10-CM | POA: Diagnosis not present

## 2013-10-15 DIAGNOSIS — I69928 Other speech and language deficits following unspecified cerebrovascular disease: Secondary | ICD-10-CM | POA: Diagnosis not present

## 2013-10-15 DIAGNOSIS — K8010 Calculus of gallbladder with chronic cholecystitis without obstruction: Secondary | ICD-10-CM | POA: Diagnosis present

## 2013-10-15 DIAGNOSIS — F3289 Other specified depressive episodes: Secondary | ICD-10-CM | POA: Diagnosis present

## 2013-10-15 DIAGNOSIS — I69998 Other sequelae following unspecified cerebrovascular disease: Secondary | ICD-10-CM | POA: Diagnosis not present

## 2013-10-15 DIAGNOSIS — R072 Precordial pain: Secondary | ICD-10-CM | POA: Diagnosis not present

## 2013-10-15 DIAGNOSIS — K812 Acute cholecystitis with chronic cholecystitis: Secondary | ICD-10-CM | POA: Diagnosis not present

## 2013-10-15 DIAGNOSIS — K8020 Calculus of gallbladder without cholecystitis without obstruction: Secondary | ICD-10-CM | POA: Diagnosis not present

## 2013-10-15 DIAGNOSIS — I6990 Unspecified sequelae of unspecified cerebrovascular disease: Secondary | ICD-10-CM | POA: Diagnosis not present

## 2013-10-15 DIAGNOSIS — R112 Nausea with vomiting, unspecified: Secondary | ICD-10-CM | POA: Diagnosis not present

## 2013-10-15 DIAGNOSIS — E860 Dehydration: Secondary | ICD-10-CM | POA: Diagnosis not present

## 2013-10-15 DIAGNOSIS — E7800 Pure hypercholesterolemia, unspecified: Secondary | ICD-10-CM | POA: Diagnosis present

## 2013-10-15 DIAGNOSIS — Z7982 Long term (current) use of aspirin: Secondary | ICD-10-CM | POA: Diagnosis not present

## 2013-10-15 DIAGNOSIS — R109 Unspecified abdominal pain: Secondary | ICD-10-CM | POA: Diagnosis not present

## 2013-10-15 DIAGNOSIS — J90 Pleural effusion, not elsewhere classified: Secondary | ICD-10-CM | POA: Diagnosis not present

## 2013-10-15 DIAGNOSIS — K8000 Calculus of gallbladder with acute cholecystitis without obstruction: Secondary | ICD-10-CM | POA: Diagnosis present

## 2013-10-15 DIAGNOSIS — A419 Sepsis, unspecified organism: Secondary | ICD-10-CM | POA: Diagnosis present

## 2013-10-15 DIAGNOSIS — K828 Other specified diseases of gallbladder: Secondary | ICD-10-CM | POA: Diagnosis not present

## 2013-10-15 DIAGNOSIS — Z79899 Other long term (current) drug therapy: Secondary | ICD-10-CM | POA: Diagnosis not present

## 2013-10-15 DIAGNOSIS — I69959 Hemiplegia and hemiparesis following unspecified cerebrovascular disease affecting unspecified side: Secondary | ICD-10-CM | POA: Diagnosis not present

## 2013-10-15 DIAGNOSIS — F329 Major depressive disorder, single episode, unspecified: Secondary | ICD-10-CM | POA: Diagnosis present

## 2013-10-15 DIAGNOSIS — J690 Pneumonitis due to inhalation of food and vomit: Secondary | ICD-10-CM | POA: Diagnosis not present

## 2013-10-15 DIAGNOSIS — J9819 Other pulmonary collapse: Secondary | ICD-10-CM | POA: Diagnosis not present

## 2013-10-15 DIAGNOSIS — R5381 Other malaise: Secondary | ICD-10-CM | POA: Diagnosis present

## 2013-10-15 DIAGNOSIS — I69921 Dysphasia following unspecified cerebrovascular disease: Secondary | ICD-10-CM | POA: Diagnosis not present

## 2013-10-15 DIAGNOSIS — I69920 Aphasia following unspecified cerebrovascular disease: Secondary | ICD-10-CM | POA: Diagnosis not present

## 2013-10-15 DIAGNOSIS — I10 Essential (primary) hypertension: Secondary | ICD-10-CM | POA: Diagnosis not present

## 2013-10-15 DIAGNOSIS — A4159 Other Gram-negative sepsis: Secondary | ICD-10-CM | POA: Diagnosis not present

## 2013-10-24 DIAGNOSIS — R1312 Dysphagia, oropharyngeal phase: Secondary | ICD-10-CM | POA: Diagnosis not present

## 2013-10-24 DIAGNOSIS — J690 Pneumonitis due to inhalation of food and vomit: Secondary | ICD-10-CM | POA: Diagnosis not present

## 2013-10-24 DIAGNOSIS — R109 Unspecified abdominal pain: Secondary | ICD-10-CM | POA: Diagnosis not present

## 2013-10-24 DIAGNOSIS — Z9889 Other specified postprocedural states: Secondary | ICD-10-CM | POA: Diagnosis not present

## 2013-11-07 DIAGNOSIS — IMO0001 Reserved for inherently not codable concepts without codable children: Secondary | ICD-10-CM | POA: Diagnosis not present

## 2013-11-07 DIAGNOSIS — I69959 Hemiplegia and hemiparesis following unspecified cerebrovascular disease affecting unspecified side: Secondary | ICD-10-CM | POA: Diagnosis not present

## 2013-11-07 DIAGNOSIS — M531 Cervicobrachial syndrome: Secondary | ICD-10-CM | POA: Diagnosis not present

## 2013-11-07 DIAGNOSIS — Z48815 Encounter for surgical aftercare following surgery on the digestive system: Secondary | ICD-10-CM | POA: Diagnosis not present

## 2013-11-07 DIAGNOSIS — R1312 Dysphagia, oropharyngeal phase: Secondary | ICD-10-CM | POA: Diagnosis not present

## 2013-11-13 DIAGNOSIS — I69959 Hemiplegia and hemiparesis following unspecified cerebrovascular disease affecting unspecified side: Secondary | ICD-10-CM | POA: Diagnosis not present

## 2013-11-13 DIAGNOSIS — R1312 Dysphagia, oropharyngeal phase: Secondary | ICD-10-CM | POA: Diagnosis not present

## 2013-11-13 DIAGNOSIS — M531 Cervicobrachial syndrome: Secondary | ICD-10-CM | POA: Diagnosis not present

## 2013-11-13 DIAGNOSIS — IMO0001 Reserved for inherently not codable concepts without codable children: Secondary | ICD-10-CM | POA: Diagnosis not present

## 2013-11-13 DIAGNOSIS — Z48815 Encounter for surgical aftercare following surgery on the digestive system: Secondary | ICD-10-CM | POA: Diagnosis not present

## 2013-11-14 DIAGNOSIS — IMO0001 Reserved for inherently not codable concepts without codable children: Secondary | ICD-10-CM | POA: Diagnosis not present

## 2013-11-14 DIAGNOSIS — Z48815 Encounter for surgical aftercare following surgery on the digestive system: Secondary | ICD-10-CM | POA: Diagnosis not present

## 2013-11-14 DIAGNOSIS — M531 Cervicobrachial syndrome: Secondary | ICD-10-CM | POA: Diagnosis not present

## 2013-11-14 DIAGNOSIS — R1312 Dysphagia, oropharyngeal phase: Secondary | ICD-10-CM | POA: Diagnosis not present

## 2013-11-14 DIAGNOSIS — I69959 Hemiplegia and hemiparesis following unspecified cerebrovascular disease affecting unspecified side: Secondary | ICD-10-CM | POA: Diagnosis not present

## 2013-11-15 DIAGNOSIS — IMO0001 Reserved for inherently not codable concepts without codable children: Secondary | ICD-10-CM | POA: Diagnosis not present

## 2013-11-15 DIAGNOSIS — Z48815 Encounter for surgical aftercare following surgery on the digestive system: Secondary | ICD-10-CM | POA: Diagnosis not present

## 2013-11-15 DIAGNOSIS — I69959 Hemiplegia and hemiparesis following unspecified cerebrovascular disease affecting unspecified side: Secondary | ICD-10-CM | POA: Diagnosis not present

## 2013-11-15 DIAGNOSIS — R1312 Dysphagia, oropharyngeal phase: Secondary | ICD-10-CM | POA: Diagnosis not present

## 2013-11-15 DIAGNOSIS — M531 Cervicobrachial syndrome: Secondary | ICD-10-CM | POA: Diagnosis not present

## 2013-11-21 DIAGNOSIS — I69959 Hemiplegia and hemiparesis following unspecified cerebrovascular disease affecting unspecified side: Secondary | ICD-10-CM | POA: Diagnosis not present

## 2013-11-21 DIAGNOSIS — M531 Cervicobrachial syndrome: Secondary | ICD-10-CM | POA: Diagnosis not present

## 2013-11-21 DIAGNOSIS — IMO0001 Reserved for inherently not codable concepts without codable children: Secondary | ICD-10-CM | POA: Diagnosis not present

## 2013-11-21 DIAGNOSIS — R1312 Dysphagia, oropharyngeal phase: Secondary | ICD-10-CM | POA: Diagnosis not present

## 2013-11-21 DIAGNOSIS — Z48815 Encounter for surgical aftercare following surgery on the digestive system: Secondary | ICD-10-CM | POA: Diagnosis not present

## 2013-11-22 DIAGNOSIS — Z48815 Encounter for surgical aftercare following surgery on the digestive system: Secondary | ICD-10-CM | POA: Diagnosis not present

## 2013-11-22 DIAGNOSIS — R1312 Dysphagia, oropharyngeal phase: Secondary | ICD-10-CM | POA: Diagnosis not present

## 2013-11-22 DIAGNOSIS — IMO0001 Reserved for inherently not codable concepts without codable children: Secondary | ICD-10-CM | POA: Diagnosis not present

## 2013-11-22 DIAGNOSIS — M531 Cervicobrachial syndrome: Secondary | ICD-10-CM | POA: Diagnosis not present

## 2013-11-22 DIAGNOSIS — I69959 Hemiplegia and hemiparesis following unspecified cerebrovascular disease affecting unspecified side: Secondary | ICD-10-CM | POA: Diagnosis not present

## 2013-11-25 DIAGNOSIS — I69959 Hemiplegia and hemiparesis following unspecified cerebrovascular disease affecting unspecified side: Secondary | ICD-10-CM | POA: Diagnosis not present

## 2013-11-25 DIAGNOSIS — Z48815 Encounter for surgical aftercare following surgery on the digestive system: Secondary | ICD-10-CM | POA: Diagnosis not present

## 2013-11-25 DIAGNOSIS — M531 Cervicobrachial syndrome: Secondary | ICD-10-CM | POA: Diagnosis not present

## 2013-11-25 DIAGNOSIS — R1312 Dysphagia, oropharyngeal phase: Secondary | ICD-10-CM | POA: Diagnosis not present

## 2013-11-25 DIAGNOSIS — IMO0001 Reserved for inherently not codable concepts without codable children: Secondary | ICD-10-CM | POA: Diagnosis not present

## 2013-11-26 DIAGNOSIS — I69959 Hemiplegia and hemiparesis following unspecified cerebrovascular disease affecting unspecified side: Secondary | ICD-10-CM | POA: Diagnosis not present

## 2013-11-26 DIAGNOSIS — Z48815 Encounter for surgical aftercare following surgery on the digestive system: Secondary | ICD-10-CM | POA: Diagnosis not present

## 2013-11-26 DIAGNOSIS — R1312 Dysphagia, oropharyngeal phase: Secondary | ICD-10-CM | POA: Diagnosis not present

## 2013-11-26 DIAGNOSIS — M531 Cervicobrachial syndrome: Secondary | ICD-10-CM | POA: Diagnosis not present

## 2013-11-26 DIAGNOSIS — IMO0001 Reserved for inherently not codable concepts without codable children: Secondary | ICD-10-CM | POA: Diagnosis not present

## 2013-11-28 DIAGNOSIS — Z139 Encounter for screening, unspecified: Secondary | ICD-10-CM | POA: Diagnosis not present

## 2013-11-28 DIAGNOSIS — I10 Essential (primary) hypertension: Secondary | ICD-10-CM | POA: Diagnosis not present

## 2013-11-28 DIAGNOSIS — Z125 Encounter for screening for malignant neoplasm of prostate: Secondary | ICD-10-CM | POA: Diagnosis not present

## 2013-11-28 DIAGNOSIS — E782 Mixed hyperlipidemia: Secondary | ICD-10-CM | POA: Diagnosis not present

## 2013-12-03 DIAGNOSIS — M531 Cervicobrachial syndrome: Secondary | ICD-10-CM | POA: Diagnosis not present

## 2013-12-03 DIAGNOSIS — R1312 Dysphagia, oropharyngeal phase: Secondary | ICD-10-CM | POA: Diagnosis not present

## 2013-12-03 DIAGNOSIS — I69959 Hemiplegia and hemiparesis following unspecified cerebrovascular disease affecting unspecified side: Secondary | ICD-10-CM | POA: Diagnosis not present

## 2013-12-03 DIAGNOSIS — Z48815 Encounter for surgical aftercare following surgery on the digestive system: Secondary | ICD-10-CM | POA: Diagnosis not present

## 2013-12-03 DIAGNOSIS — IMO0001 Reserved for inherently not codable concepts without codable children: Secondary | ICD-10-CM | POA: Diagnosis not present

## 2013-12-04 DIAGNOSIS — IMO0001 Reserved for inherently not codable concepts without codable children: Secondary | ICD-10-CM | POA: Diagnosis not present

## 2013-12-04 DIAGNOSIS — M531 Cervicobrachial syndrome: Secondary | ICD-10-CM | POA: Diagnosis not present

## 2013-12-04 DIAGNOSIS — I69959 Hemiplegia and hemiparesis following unspecified cerebrovascular disease affecting unspecified side: Secondary | ICD-10-CM | POA: Diagnosis not present

## 2013-12-04 DIAGNOSIS — R1312 Dysphagia, oropharyngeal phase: Secondary | ICD-10-CM | POA: Diagnosis not present

## 2013-12-04 DIAGNOSIS — Z48815 Encounter for surgical aftercare following surgery on the digestive system: Secondary | ICD-10-CM | POA: Diagnosis not present

## 2013-12-05 DIAGNOSIS — IMO0001 Reserved for inherently not codable concepts without codable children: Secondary | ICD-10-CM | POA: Diagnosis not present

## 2013-12-05 DIAGNOSIS — R1312 Dysphagia, oropharyngeal phase: Secondary | ICD-10-CM | POA: Diagnosis not present

## 2013-12-05 DIAGNOSIS — M531 Cervicobrachial syndrome: Secondary | ICD-10-CM | POA: Diagnosis not present

## 2013-12-05 DIAGNOSIS — I69959 Hemiplegia and hemiparesis following unspecified cerebrovascular disease affecting unspecified side: Secondary | ICD-10-CM | POA: Diagnosis not present

## 2013-12-05 DIAGNOSIS — Z48815 Encounter for surgical aftercare following surgery on the digestive system: Secondary | ICD-10-CM | POA: Diagnosis not present

## 2013-12-09 DIAGNOSIS — IMO0001 Reserved for inherently not codable concepts without codable children: Secondary | ICD-10-CM | POA: Diagnosis not present

## 2013-12-09 DIAGNOSIS — I69959 Hemiplegia and hemiparesis following unspecified cerebrovascular disease affecting unspecified side: Secondary | ICD-10-CM | POA: Diagnosis not present

## 2013-12-09 DIAGNOSIS — M531 Cervicobrachial syndrome: Secondary | ICD-10-CM | POA: Diagnosis not present

## 2013-12-09 DIAGNOSIS — Z48815 Encounter for surgical aftercare following surgery on the digestive system: Secondary | ICD-10-CM | POA: Diagnosis not present

## 2013-12-09 DIAGNOSIS — R1312 Dysphagia, oropharyngeal phase: Secondary | ICD-10-CM | POA: Diagnosis not present

## 2013-12-18 DIAGNOSIS — IMO0001 Reserved for inherently not codable concepts without codable children: Secondary | ICD-10-CM | POA: Diagnosis not present

## 2013-12-18 DIAGNOSIS — Z48815 Encounter for surgical aftercare following surgery on the digestive system: Secondary | ICD-10-CM | POA: Diagnosis not present

## 2013-12-18 DIAGNOSIS — M531 Cervicobrachial syndrome: Secondary | ICD-10-CM | POA: Diagnosis not present

## 2013-12-18 DIAGNOSIS — R1312 Dysphagia, oropharyngeal phase: Secondary | ICD-10-CM | POA: Diagnosis not present

## 2013-12-18 DIAGNOSIS — I69959 Hemiplegia and hemiparesis following unspecified cerebrovascular disease affecting unspecified side: Secondary | ICD-10-CM | POA: Diagnosis not present

## 2014-02-10 DIAGNOSIS — R748 Abnormal levels of other serum enzymes: Secondary | ICD-10-CM | POA: Diagnosis not present

## 2014-02-10 DIAGNOSIS — R7989 Other specified abnormal findings of blood chemistry: Secondary | ICD-10-CM | POA: Diagnosis not present

## 2014-02-10 DIAGNOSIS — Z23 Encounter for immunization: Secondary | ICD-10-CM | POA: Diagnosis not present

## 2014-03-12 DIAGNOSIS — H532 Diplopia: Secondary | ICD-10-CM | POA: Diagnosis not present

## 2014-03-12 DIAGNOSIS — H26493 Other secondary cataract, bilateral: Secondary | ICD-10-CM | POA: Diagnosis not present

## 2014-04-09 DIAGNOSIS — H5034 Intermittent alternating exotropia: Secondary | ICD-10-CM | POA: Diagnosis not present

## 2014-04-14 DIAGNOSIS — R630 Anorexia: Secondary | ICD-10-CM | POA: Diagnosis not present

## 2014-04-14 DIAGNOSIS — E782 Mixed hyperlipidemia: Secondary | ICD-10-CM | POA: Diagnosis not present

## 2014-04-14 DIAGNOSIS — I2510 Atherosclerotic heart disease of native coronary artery without angina pectoris: Secondary | ICD-10-CM | POA: Diagnosis not present

## 2014-04-14 DIAGNOSIS — R972 Elevated prostate specific antigen [PSA]: Secondary | ICD-10-CM | POA: Diagnosis not present

## 2014-04-14 DIAGNOSIS — I10 Essential (primary) hypertension: Secondary | ICD-10-CM | POA: Diagnosis not present

## 2014-05-05 DIAGNOSIS — Z8673 Personal history of transient ischemic attack (TIA), and cerebral infarction without residual deficits: Secondary | ICD-10-CM | POA: Diagnosis not present

## 2014-05-05 DIAGNOSIS — I2510 Atherosclerotic heart disease of native coronary artery without angina pectoris: Secondary | ICD-10-CM | POA: Diagnosis not present

## 2014-05-05 DIAGNOSIS — E785 Hyperlipidemia, unspecified: Secondary | ICD-10-CM | POA: Diagnosis not present

## 2014-05-05 DIAGNOSIS — I10 Essential (primary) hypertension: Secondary | ICD-10-CM | POA: Diagnosis not present

## 2014-06-11 DIAGNOSIS — E782 Mixed hyperlipidemia: Secondary | ICD-10-CM | POA: Diagnosis not present

## 2014-07-21 DIAGNOSIS — E782 Mixed hyperlipidemia: Secondary | ICD-10-CM | POA: Diagnosis not present

## 2014-07-21 DIAGNOSIS — I10 Essential (primary) hypertension: Secondary | ICD-10-CM | POA: Diagnosis not present

## 2014-07-21 DIAGNOSIS — R1310 Dysphagia, unspecified: Secondary | ICD-10-CM | POA: Diagnosis not present

## 2014-07-21 DIAGNOSIS — I2510 Atherosclerotic heart disease of native coronary artery without angina pectoris: Secondary | ICD-10-CM | POA: Diagnosis not present

## 2014-11-20 DIAGNOSIS — R001 Bradycardia, unspecified: Secondary | ICD-10-CM | POA: Diagnosis not present

## 2014-11-20 DIAGNOSIS — I10 Essential (primary) hypertension: Secondary | ICD-10-CM | POA: Diagnosis not present

## 2014-11-20 DIAGNOSIS — E782 Mixed hyperlipidemia: Secondary | ICD-10-CM | POA: Diagnosis not present

## 2014-11-20 DIAGNOSIS — Z9981 Dependence on supplemental oxygen: Secondary | ICD-10-CM | POA: Diagnosis not present

## 2014-12-15 DIAGNOSIS — I10 Essential (primary) hypertension: Secondary | ICD-10-CM | POA: Diagnosis not present

## 2014-12-15 DIAGNOSIS — L89151 Pressure ulcer of sacral region, stage 1: Secondary | ICD-10-CM | POA: Diagnosis not present

## 2014-12-15 DIAGNOSIS — I6930 Unspecified sequelae of cerebral infarction: Secondary | ICD-10-CM | POA: Diagnosis not present

## 2014-12-15 DIAGNOSIS — R1312 Dysphagia, oropharyngeal phase: Secondary | ICD-10-CM | POA: Diagnosis not present

## 2014-12-15 DIAGNOSIS — G8100 Flaccid hemiplegia affecting unspecified side: Secondary | ICD-10-CM | POA: Diagnosis not present

## 2014-12-15 DIAGNOSIS — E782 Mixed hyperlipidemia: Secondary | ICD-10-CM | POA: Diagnosis not present

## 2014-12-16 DIAGNOSIS — E782 Mixed hyperlipidemia: Secondary | ICD-10-CM | POA: Diagnosis not present

## 2014-12-16 DIAGNOSIS — I6930 Unspecified sequelae of cerebral infarction: Secondary | ICD-10-CM | POA: Diagnosis not present

## 2014-12-16 DIAGNOSIS — R1312 Dysphagia, oropharyngeal phase: Secondary | ICD-10-CM | POA: Diagnosis not present

## 2014-12-16 DIAGNOSIS — G8100 Flaccid hemiplegia affecting unspecified side: Secondary | ICD-10-CM | POA: Diagnosis not present

## 2014-12-16 DIAGNOSIS — I10 Essential (primary) hypertension: Secondary | ICD-10-CM | POA: Diagnosis not present

## 2014-12-16 DIAGNOSIS — L89151 Pressure ulcer of sacral region, stage 1: Secondary | ICD-10-CM | POA: Diagnosis not present

## 2014-12-18 DIAGNOSIS — I6930 Unspecified sequelae of cerebral infarction: Secondary | ICD-10-CM | POA: Diagnosis not present

## 2014-12-18 DIAGNOSIS — E782 Mixed hyperlipidemia: Secondary | ICD-10-CM | POA: Diagnosis not present

## 2014-12-18 DIAGNOSIS — R1312 Dysphagia, oropharyngeal phase: Secondary | ICD-10-CM | POA: Diagnosis not present

## 2014-12-18 DIAGNOSIS — I10 Essential (primary) hypertension: Secondary | ICD-10-CM | POA: Diagnosis not present

## 2014-12-18 DIAGNOSIS — G8100 Flaccid hemiplegia affecting unspecified side: Secondary | ICD-10-CM | POA: Diagnosis not present

## 2014-12-18 DIAGNOSIS — L89151 Pressure ulcer of sacral region, stage 1: Secondary | ICD-10-CM | POA: Diagnosis not present

## 2014-12-22 DIAGNOSIS — I6930 Unspecified sequelae of cerebral infarction: Secondary | ICD-10-CM | POA: Diagnosis not present

## 2014-12-22 DIAGNOSIS — L89151 Pressure ulcer of sacral region, stage 1: Secondary | ICD-10-CM | POA: Diagnosis not present

## 2014-12-22 DIAGNOSIS — R1312 Dysphagia, oropharyngeal phase: Secondary | ICD-10-CM | POA: Diagnosis not present

## 2014-12-22 DIAGNOSIS — E782 Mixed hyperlipidemia: Secondary | ICD-10-CM | POA: Diagnosis not present

## 2014-12-22 DIAGNOSIS — I10 Essential (primary) hypertension: Secondary | ICD-10-CM | POA: Diagnosis not present

## 2014-12-22 DIAGNOSIS — G8100 Flaccid hemiplegia affecting unspecified side: Secondary | ICD-10-CM | POA: Diagnosis not present

## 2014-12-25 DIAGNOSIS — E782 Mixed hyperlipidemia: Secondary | ICD-10-CM | POA: Diagnosis not present

## 2014-12-25 DIAGNOSIS — I6930 Unspecified sequelae of cerebral infarction: Secondary | ICD-10-CM | POA: Diagnosis not present

## 2014-12-25 DIAGNOSIS — R1312 Dysphagia, oropharyngeal phase: Secondary | ICD-10-CM | POA: Diagnosis not present

## 2014-12-25 DIAGNOSIS — L89151 Pressure ulcer of sacral region, stage 1: Secondary | ICD-10-CM | POA: Diagnosis not present

## 2014-12-25 DIAGNOSIS — I10 Essential (primary) hypertension: Secondary | ICD-10-CM | POA: Diagnosis not present

## 2014-12-25 DIAGNOSIS — G8100 Flaccid hemiplegia affecting unspecified side: Secondary | ICD-10-CM | POA: Diagnosis not present

## 2015-01-01 DIAGNOSIS — E782 Mixed hyperlipidemia: Secondary | ICD-10-CM | POA: Diagnosis not present

## 2015-01-01 DIAGNOSIS — I10 Essential (primary) hypertension: Secondary | ICD-10-CM | POA: Diagnosis not present

## 2015-01-01 DIAGNOSIS — R1312 Dysphagia, oropharyngeal phase: Secondary | ICD-10-CM | POA: Diagnosis not present

## 2015-01-01 DIAGNOSIS — L89151 Pressure ulcer of sacral region, stage 1: Secondary | ICD-10-CM | POA: Diagnosis not present

## 2015-01-01 DIAGNOSIS — I6930 Unspecified sequelae of cerebral infarction: Secondary | ICD-10-CM | POA: Diagnosis not present

## 2015-01-01 DIAGNOSIS — G8100 Flaccid hemiplegia affecting unspecified side: Secondary | ICD-10-CM | POA: Diagnosis not present

## 2015-01-06 DIAGNOSIS — G8100 Flaccid hemiplegia affecting unspecified side: Secondary | ICD-10-CM | POA: Diagnosis not present

## 2015-01-06 DIAGNOSIS — I10 Essential (primary) hypertension: Secondary | ICD-10-CM | POA: Diagnosis not present

## 2015-01-06 DIAGNOSIS — L89151 Pressure ulcer of sacral region, stage 1: Secondary | ICD-10-CM | POA: Diagnosis not present

## 2015-01-06 DIAGNOSIS — E782 Mixed hyperlipidemia: Secondary | ICD-10-CM | POA: Diagnosis not present

## 2015-01-06 DIAGNOSIS — I6930 Unspecified sequelae of cerebral infarction: Secondary | ICD-10-CM | POA: Diagnosis not present

## 2015-01-06 DIAGNOSIS — R1312 Dysphagia, oropharyngeal phase: Secondary | ICD-10-CM | POA: Diagnosis not present

## 2015-03-03 DIAGNOSIS — Z0000 Encounter for general adult medical examination without abnormal findings: Secondary | ICD-10-CM | POA: Diagnosis not present

## 2015-03-03 DIAGNOSIS — Z23 Encounter for immunization: Secondary | ICD-10-CM | POA: Diagnosis not present

## 2015-03-03 DIAGNOSIS — I10 Essential (primary) hypertension: Secondary | ICD-10-CM | POA: Diagnosis not present

## 2015-03-03 DIAGNOSIS — Z9181 History of falling: Secondary | ICD-10-CM | POA: Diagnosis not present

## 2015-03-03 DIAGNOSIS — Z139 Encounter for screening, unspecified: Secondary | ICD-10-CM | POA: Diagnosis not present

## 2015-03-03 DIAGNOSIS — E782 Mixed hyperlipidemia: Secondary | ICD-10-CM | POA: Diagnosis not present

## 2015-03-24 DIAGNOSIS — E782 Mixed hyperlipidemia: Secondary | ICD-10-CM | POA: Diagnosis not present

## 2015-03-24 DIAGNOSIS — R531 Weakness: Secondary | ICD-10-CM | POA: Diagnosis not present

## 2015-03-24 DIAGNOSIS — I10 Essential (primary) hypertension: Secondary | ICD-10-CM | POA: Diagnosis not present

## 2015-03-24 DIAGNOSIS — R05 Cough: Secondary | ICD-10-CM | POA: Diagnosis not present

## 2015-05-28 DIAGNOSIS — I10 Essential (primary) hypertension: Secondary | ICD-10-CM | POA: Diagnosis not present

## 2015-05-28 DIAGNOSIS — Z87891 Personal history of nicotine dependence: Secondary | ICD-10-CM | POA: Diagnosis not present

## 2015-05-28 DIAGNOSIS — Z7982 Long term (current) use of aspirin: Secondary | ICD-10-CM | POA: Diagnosis not present

## 2015-05-28 DIAGNOSIS — Z7901 Long term (current) use of anticoagulants: Secondary | ICD-10-CM | POA: Diagnosis not present

## 2015-05-28 DIAGNOSIS — Z993 Dependence on wheelchair: Secondary | ICD-10-CM | POA: Diagnosis not present

## 2015-05-28 DIAGNOSIS — I69354 Hemiplegia and hemiparesis following cerebral infarction affecting left non-dominant side: Secondary | ICD-10-CM | POA: Diagnosis not present

## 2015-05-28 DIAGNOSIS — L89151 Pressure ulcer of sacral region, stage 1: Secondary | ICD-10-CM | POA: Diagnosis not present

## 2015-06-04 DIAGNOSIS — Z993 Dependence on wheelchair: Secondary | ICD-10-CM | POA: Diagnosis not present

## 2015-06-04 DIAGNOSIS — L89151 Pressure ulcer of sacral region, stage 1: Secondary | ICD-10-CM | POA: Diagnosis not present

## 2015-06-04 DIAGNOSIS — I10 Essential (primary) hypertension: Secondary | ICD-10-CM | POA: Diagnosis not present

## 2015-06-04 DIAGNOSIS — Z7982 Long term (current) use of aspirin: Secondary | ICD-10-CM | POA: Diagnosis not present

## 2015-06-04 DIAGNOSIS — Z87891 Personal history of nicotine dependence: Secondary | ICD-10-CM | POA: Diagnosis not present

## 2015-06-04 DIAGNOSIS — I69354 Hemiplegia and hemiparesis following cerebral infarction affecting left non-dominant side: Secondary | ICD-10-CM | POA: Diagnosis not present

## 2015-06-12 DIAGNOSIS — Z993 Dependence on wheelchair: Secondary | ICD-10-CM | POA: Diagnosis not present

## 2015-06-12 DIAGNOSIS — I69354 Hemiplegia and hemiparesis following cerebral infarction affecting left non-dominant side: Secondary | ICD-10-CM | POA: Diagnosis not present

## 2015-06-12 DIAGNOSIS — L89151 Pressure ulcer of sacral region, stage 1: Secondary | ICD-10-CM | POA: Diagnosis not present

## 2015-06-12 DIAGNOSIS — I10 Essential (primary) hypertension: Secondary | ICD-10-CM | POA: Diagnosis not present

## 2015-06-12 DIAGNOSIS — Z7982 Long term (current) use of aspirin: Secondary | ICD-10-CM | POA: Diagnosis not present

## 2015-06-12 DIAGNOSIS — Z87891 Personal history of nicotine dependence: Secondary | ICD-10-CM | POA: Diagnosis not present

## 2015-06-19 DIAGNOSIS — Z87891 Personal history of nicotine dependence: Secondary | ICD-10-CM | POA: Diagnosis not present

## 2015-06-19 DIAGNOSIS — Z7982 Long term (current) use of aspirin: Secondary | ICD-10-CM | POA: Diagnosis not present

## 2015-06-19 DIAGNOSIS — I69354 Hemiplegia and hemiparesis following cerebral infarction affecting left non-dominant side: Secondary | ICD-10-CM | POA: Diagnosis not present

## 2015-06-19 DIAGNOSIS — Z993 Dependence on wheelchair: Secondary | ICD-10-CM | POA: Diagnosis not present

## 2015-06-19 DIAGNOSIS — I10 Essential (primary) hypertension: Secondary | ICD-10-CM | POA: Diagnosis not present

## 2015-06-19 DIAGNOSIS — L89151 Pressure ulcer of sacral region, stage 1: Secondary | ICD-10-CM | POA: Diagnosis not present

## 2015-06-24 DIAGNOSIS — R972 Elevated prostate specific antigen [PSA]: Secondary | ICD-10-CM | POA: Diagnosis not present

## 2015-06-24 DIAGNOSIS — I10 Essential (primary) hypertension: Secondary | ICD-10-CM | POA: Diagnosis not present

## 2015-06-24 DIAGNOSIS — R1312 Dysphagia, oropharyngeal phase: Secondary | ICD-10-CM | POA: Diagnosis not present

## 2015-06-24 DIAGNOSIS — E782 Mixed hyperlipidemia: Secondary | ICD-10-CM | POA: Diagnosis not present

## 2015-06-24 DIAGNOSIS — Z131 Encounter for screening for diabetes mellitus: Secondary | ICD-10-CM | POA: Diagnosis not present

## 2015-06-24 DIAGNOSIS — R634 Abnormal weight loss: Secondary | ICD-10-CM | POA: Diagnosis not present

## 2015-06-24 DIAGNOSIS — L89309 Pressure ulcer of unspecified buttock, unspecified stage: Secondary | ICD-10-CM | POA: Diagnosis not present

## 2015-06-24 DIAGNOSIS — T85598A Other mechanical complication of other gastrointestinal prosthetic devices, implants and grafts, initial encounter: Secondary | ICD-10-CM | POA: Diagnosis not present

## 2015-06-26 DIAGNOSIS — Z993 Dependence on wheelchair: Secondary | ICD-10-CM | POA: Diagnosis not present

## 2015-06-26 DIAGNOSIS — I10 Essential (primary) hypertension: Secondary | ICD-10-CM | POA: Diagnosis not present

## 2015-06-26 DIAGNOSIS — Z7982 Long term (current) use of aspirin: Secondary | ICD-10-CM | POA: Diagnosis not present

## 2015-06-26 DIAGNOSIS — Z87891 Personal history of nicotine dependence: Secondary | ICD-10-CM | POA: Diagnosis not present

## 2015-06-26 DIAGNOSIS — L89151 Pressure ulcer of sacral region, stage 1: Secondary | ICD-10-CM | POA: Diagnosis not present

## 2015-06-26 DIAGNOSIS — I69354 Hemiplegia and hemiparesis following cerebral infarction affecting left non-dominant side: Secondary | ICD-10-CM | POA: Diagnosis not present

## 2015-07-01 DIAGNOSIS — Z431 Encounter for attention to gastrostomy: Secondary | ICD-10-CM | POA: Diagnosis not present

## 2015-07-02 DIAGNOSIS — Z87891 Personal history of nicotine dependence: Secondary | ICD-10-CM | POA: Diagnosis not present

## 2015-07-02 DIAGNOSIS — Z7982 Long term (current) use of aspirin: Secondary | ICD-10-CM | POA: Diagnosis not present

## 2015-07-02 DIAGNOSIS — L89151 Pressure ulcer of sacral region, stage 1: Secondary | ICD-10-CM | POA: Diagnosis not present

## 2015-07-02 DIAGNOSIS — I10 Essential (primary) hypertension: Secondary | ICD-10-CM | POA: Diagnosis not present

## 2015-07-02 DIAGNOSIS — Z993 Dependence on wheelchair: Secondary | ICD-10-CM | POA: Diagnosis not present

## 2015-07-02 DIAGNOSIS — I69354 Hemiplegia and hemiparesis following cerebral infarction affecting left non-dominant side: Secondary | ICD-10-CM | POA: Diagnosis not present

## 2015-07-06 DIAGNOSIS — I69359 Hemiplegia and hemiparesis following cerebral infarction affecting unspecified side: Secondary | ICD-10-CM | POA: Diagnosis not present

## 2015-07-06 DIAGNOSIS — I69320 Aphasia following cerebral infarction: Secondary | ICD-10-CM | POA: Diagnosis not present

## 2015-07-06 DIAGNOSIS — L89152 Pressure ulcer of sacral region, stage 2: Secondary | ICD-10-CM | POA: Diagnosis not present

## 2015-07-06 DIAGNOSIS — Z23 Encounter for immunization: Secondary | ICD-10-CM | POA: Diagnosis not present

## 2015-07-06 DIAGNOSIS — I69391 Dysphagia following cerebral infarction: Secondary | ICD-10-CM | POA: Diagnosis not present

## 2015-07-13 DIAGNOSIS — Z7901 Long term (current) use of anticoagulants: Secondary | ICD-10-CM | POA: Diagnosis not present

## 2015-07-13 DIAGNOSIS — Z4659 Encounter for fitting and adjustment of other gastrointestinal appliance and device: Secondary | ICD-10-CM | POA: Diagnosis not present

## 2015-07-13 DIAGNOSIS — I69391 Dysphagia following cerebral infarction: Secondary | ICD-10-CM | POA: Diagnosis not present

## 2015-07-13 DIAGNOSIS — R633 Feeding difficulties: Secondary | ICD-10-CM | POA: Diagnosis not present

## 2015-07-13 DIAGNOSIS — Z7902 Long term (current) use of antithrombotics/antiplatelets: Secondary | ICD-10-CM | POA: Diagnosis not present

## 2015-07-13 DIAGNOSIS — Z79899 Other long term (current) drug therapy: Secondary | ICD-10-CM | POA: Diagnosis not present

## 2015-07-13 DIAGNOSIS — J3802 Paralysis of vocal cords and larynx, bilateral: Secondary | ICD-10-CM | POA: Diagnosis not present

## 2015-07-13 DIAGNOSIS — R490 Dysphonia: Secondary | ICD-10-CM | POA: Diagnosis not present

## 2015-07-13 DIAGNOSIS — R1312 Dysphagia, oropharyngeal phase: Secondary | ICD-10-CM | POA: Diagnosis not present

## 2015-07-13 DIAGNOSIS — K9429 Other complications of gastrostomy: Secondary | ICD-10-CM | POA: Diagnosis not present

## 2015-07-13 DIAGNOSIS — Z7982 Long term (current) use of aspirin: Secondary | ICD-10-CM | POA: Diagnosis not present

## 2015-07-14 DIAGNOSIS — Z87891 Personal history of nicotine dependence: Secondary | ICD-10-CM | POA: Diagnosis not present

## 2015-07-14 DIAGNOSIS — I69354 Hemiplegia and hemiparesis following cerebral infarction affecting left non-dominant side: Secondary | ICD-10-CM | POA: Diagnosis not present

## 2015-07-14 DIAGNOSIS — Z7982 Long term (current) use of aspirin: Secondary | ICD-10-CM | POA: Diagnosis not present

## 2015-07-14 DIAGNOSIS — I10 Essential (primary) hypertension: Secondary | ICD-10-CM | POA: Diagnosis not present

## 2015-07-14 DIAGNOSIS — L89151 Pressure ulcer of sacral region, stage 1: Secondary | ICD-10-CM | POA: Diagnosis not present

## 2015-07-14 DIAGNOSIS — Z993 Dependence on wheelchair: Secondary | ICD-10-CM | POA: Diagnosis not present

## 2015-07-16 DIAGNOSIS — I10 Essential (primary) hypertension: Secondary | ICD-10-CM | POA: Diagnosis not present

## 2015-07-16 DIAGNOSIS — I69354 Hemiplegia and hemiparesis following cerebral infarction affecting left non-dominant side: Secondary | ICD-10-CM | POA: Diagnosis not present

## 2015-07-16 DIAGNOSIS — L89151 Pressure ulcer of sacral region, stage 1: Secondary | ICD-10-CM | POA: Diagnosis not present

## 2015-07-16 DIAGNOSIS — Z7189 Other specified counseling: Secondary | ICD-10-CM | POA: Diagnosis not present

## 2015-07-16 DIAGNOSIS — Z993 Dependence on wheelchair: Secondary | ICD-10-CM | POA: Diagnosis not present

## 2015-07-16 DIAGNOSIS — E782 Mixed hyperlipidemia: Secondary | ICD-10-CM | POA: Diagnosis not present

## 2015-07-16 DIAGNOSIS — G8100 Flaccid hemiplegia affecting unspecified side: Secondary | ICD-10-CM | POA: Diagnosis not present

## 2015-07-16 DIAGNOSIS — R1312 Dysphagia, oropharyngeal phase: Secondary | ICD-10-CM | POA: Diagnosis not present

## 2015-07-16 DIAGNOSIS — Z7982 Long term (current) use of aspirin: Secondary | ICD-10-CM | POA: Diagnosis not present

## 2015-07-16 DIAGNOSIS — Z87891 Personal history of nicotine dependence: Secondary | ICD-10-CM | POA: Diagnosis not present

## 2015-07-21 DIAGNOSIS — L89151 Pressure ulcer of sacral region, stage 1: Secondary | ICD-10-CM | POA: Diagnosis not present

## 2015-07-21 DIAGNOSIS — Z7982 Long term (current) use of aspirin: Secondary | ICD-10-CM | POA: Diagnosis not present

## 2015-07-21 DIAGNOSIS — Z993 Dependence on wheelchair: Secondary | ICD-10-CM | POA: Diagnosis not present

## 2015-07-21 DIAGNOSIS — I69354 Hemiplegia and hemiparesis following cerebral infarction affecting left non-dominant side: Secondary | ICD-10-CM | POA: Diagnosis not present

## 2015-07-21 DIAGNOSIS — I10 Essential (primary) hypertension: Secondary | ICD-10-CM | POA: Diagnosis not present

## 2015-07-21 DIAGNOSIS — Z87891 Personal history of nicotine dependence: Secondary | ICD-10-CM | POA: Diagnosis not present

## 2015-07-24 DIAGNOSIS — Z87891 Personal history of nicotine dependence: Secondary | ICD-10-CM | POA: Diagnosis not present

## 2015-07-24 DIAGNOSIS — L89151 Pressure ulcer of sacral region, stage 1: Secondary | ICD-10-CM | POA: Diagnosis not present

## 2015-07-24 DIAGNOSIS — Z7982 Long term (current) use of aspirin: Secondary | ICD-10-CM | POA: Diagnosis not present

## 2015-07-24 DIAGNOSIS — I69354 Hemiplegia and hemiparesis following cerebral infarction affecting left non-dominant side: Secondary | ICD-10-CM | POA: Diagnosis not present

## 2015-07-24 DIAGNOSIS — I10 Essential (primary) hypertension: Secondary | ICD-10-CM | POA: Diagnosis not present

## 2015-07-24 DIAGNOSIS — Z993 Dependence on wheelchair: Secondary | ICD-10-CM | POA: Diagnosis not present

## 2015-07-27 DIAGNOSIS — Z7982 Long term (current) use of aspirin: Secondary | ICD-10-CM | POA: Diagnosis not present

## 2015-07-27 DIAGNOSIS — L89151 Pressure ulcer of sacral region, stage 1: Secondary | ICD-10-CM | POA: Diagnosis not present

## 2015-07-27 DIAGNOSIS — I10 Essential (primary) hypertension: Secondary | ICD-10-CM | POA: Diagnosis not present

## 2015-07-27 DIAGNOSIS — Z993 Dependence on wheelchair: Secondary | ICD-10-CM | POA: Diagnosis not present

## 2015-07-27 DIAGNOSIS — Z87891 Personal history of nicotine dependence: Secondary | ICD-10-CM | POA: Diagnosis not present

## 2015-07-27 DIAGNOSIS — Z7901 Long term (current) use of anticoagulants: Secondary | ICD-10-CM | POA: Diagnosis not present

## 2015-07-27 DIAGNOSIS — I69354 Hemiplegia and hemiparesis following cerebral infarction affecting left non-dominant side: Secondary | ICD-10-CM | POA: Diagnosis not present

## 2015-07-30 DIAGNOSIS — L89151 Pressure ulcer of sacral region, stage 1: Secondary | ICD-10-CM | POA: Diagnosis not present

## 2015-07-30 DIAGNOSIS — Z7982 Long term (current) use of aspirin: Secondary | ICD-10-CM | POA: Diagnosis not present

## 2015-07-30 DIAGNOSIS — I69354 Hemiplegia and hemiparesis following cerebral infarction affecting left non-dominant side: Secondary | ICD-10-CM | POA: Diagnosis not present

## 2015-07-30 DIAGNOSIS — I10 Essential (primary) hypertension: Secondary | ICD-10-CM | POA: Diagnosis not present

## 2015-07-30 DIAGNOSIS — Z87891 Personal history of nicotine dependence: Secondary | ICD-10-CM | POA: Diagnosis not present

## 2015-07-30 DIAGNOSIS — Z993 Dependence on wheelchair: Secondary | ICD-10-CM | POA: Diagnosis not present

## 2015-08-07 DIAGNOSIS — Z7982 Long term (current) use of aspirin: Secondary | ICD-10-CM | POA: Diagnosis not present

## 2015-08-07 DIAGNOSIS — L89151 Pressure ulcer of sacral region, stage 1: Secondary | ICD-10-CM | POA: Diagnosis not present

## 2015-08-07 DIAGNOSIS — I10 Essential (primary) hypertension: Secondary | ICD-10-CM | POA: Diagnosis not present

## 2015-08-07 DIAGNOSIS — I69354 Hemiplegia and hemiparesis following cerebral infarction affecting left non-dominant side: Secondary | ICD-10-CM | POA: Diagnosis not present

## 2015-08-07 DIAGNOSIS — Z87891 Personal history of nicotine dependence: Secondary | ICD-10-CM | POA: Diagnosis not present

## 2015-08-07 DIAGNOSIS — Z993 Dependence on wheelchair: Secondary | ICD-10-CM | POA: Diagnosis not present

## 2015-08-19 DIAGNOSIS — I69354 Hemiplegia and hemiparesis following cerebral infarction affecting left non-dominant side: Secondary | ICD-10-CM | POA: Diagnosis not present

## 2015-08-19 DIAGNOSIS — Z993 Dependence on wheelchair: Secondary | ICD-10-CM | POA: Diagnosis not present

## 2015-08-19 DIAGNOSIS — L89151 Pressure ulcer of sacral region, stage 1: Secondary | ICD-10-CM | POA: Diagnosis not present

## 2015-08-19 DIAGNOSIS — Z87891 Personal history of nicotine dependence: Secondary | ICD-10-CM | POA: Diagnosis not present

## 2015-08-19 DIAGNOSIS — Z7982 Long term (current) use of aspirin: Secondary | ICD-10-CM | POA: Diagnosis not present

## 2015-08-19 DIAGNOSIS — I10 Essential (primary) hypertension: Secondary | ICD-10-CM | POA: Diagnosis not present

## 2015-08-20 DIAGNOSIS — I2510 Atherosclerotic heart disease of native coronary artery without angina pectoris: Secondary | ICD-10-CM | POA: Diagnosis not present

## 2015-08-20 DIAGNOSIS — G8100 Flaccid hemiplegia affecting unspecified side: Secondary | ICD-10-CM | POA: Diagnosis not present

## 2015-08-20 DIAGNOSIS — I6930 Unspecified sequelae of cerebral infarction: Secondary | ICD-10-CM | POA: Diagnosis not present

## 2015-08-20 DIAGNOSIS — R1312 Dysphagia, oropharyngeal phase: Secondary | ICD-10-CM | POA: Diagnosis not present

## 2015-08-28 DIAGNOSIS — I10 Essential (primary) hypertension: Secondary | ICD-10-CM | POA: Diagnosis not present

## 2015-08-28 DIAGNOSIS — I69354 Hemiplegia and hemiparesis following cerebral infarction affecting left non-dominant side: Secondary | ICD-10-CM | POA: Diagnosis not present

## 2015-08-28 DIAGNOSIS — Z87891 Personal history of nicotine dependence: Secondary | ICD-10-CM | POA: Diagnosis not present

## 2015-08-28 DIAGNOSIS — Z993 Dependence on wheelchair: Secondary | ICD-10-CM | POA: Diagnosis not present

## 2015-08-28 DIAGNOSIS — L89151 Pressure ulcer of sacral region, stage 1: Secondary | ICD-10-CM | POA: Diagnosis not present

## 2015-08-28 DIAGNOSIS — Z7982 Long term (current) use of aspirin: Secondary | ICD-10-CM | POA: Diagnosis not present

## 2015-09-01 DIAGNOSIS — L89151 Pressure ulcer of sacral region, stage 1: Secondary | ICD-10-CM | POA: Diagnosis not present

## 2015-09-01 DIAGNOSIS — Z993 Dependence on wheelchair: Secondary | ICD-10-CM | POA: Diagnosis not present

## 2015-09-01 DIAGNOSIS — Z7982 Long term (current) use of aspirin: Secondary | ICD-10-CM | POA: Diagnosis not present

## 2015-09-01 DIAGNOSIS — I69354 Hemiplegia and hemiparesis following cerebral infarction affecting left non-dominant side: Secondary | ICD-10-CM | POA: Diagnosis not present

## 2015-09-01 DIAGNOSIS — I10 Essential (primary) hypertension: Secondary | ICD-10-CM | POA: Diagnosis not present

## 2015-09-01 DIAGNOSIS — Z87891 Personal history of nicotine dependence: Secondary | ICD-10-CM | POA: Diagnosis not present

## 2015-09-11 DIAGNOSIS — I69354 Hemiplegia and hemiparesis following cerebral infarction affecting left non-dominant side: Secondary | ICD-10-CM | POA: Diagnosis not present

## 2015-09-11 DIAGNOSIS — Z87891 Personal history of nicotine dependence: Secondary | ICD-10-CM | POA: Diagnosis not present

## 2015-09-11 DIAGNOSIS — Z7982 Long term (current) use of aspirin: Secondary | ICD-10-CM | POA: Diagnosis not present

## 2015-09-11 DIAGNOSIS — Z993 Dependence on wheelchair: Secondary | ICD-10-CM | POA: Diagnosis not present

## 2015-09-11 DIAGNOSIS — L89151 Pressure ulcer of sacral region, stage 1: Secondary | ICD-10-CM | POA: Diagnosis not present

## 2015-09-11 DIAGNOSIS — I10 Essential (primary) hypertension: Secondary | ICD-10-CM | POA: Diagnosis not present

## 2015-09-15 DIAGNOSIS — Z993 Dependence on wheelchair: Secondary | ICD-10-CM | POA: Diagnosis not present

## 2015-09-15 DIAGNOSIS — Z87891 Personal history of nicotine dependence: Secondary | ICD-10-CM | POA: Diagnosis not present

## 2015-09-15 DIAGNOSIS — L89151 Pressure ulcer of sacral region, stage 1: Secondary | ICD-10-CM | POA: Diagnosis not present

## 2015-09-15 DIAGNOSIS — I10 Essential (primary) hypertension: Secondary | ICD-10-CM | POA: Diagnosis not present

## 2015-09-15 DIAGNOSIS — Z7982 Long term (current) use of aspirin: Secondary | ICD-10-CM | POA: Diagnosis not present

## 2015-09-15 DIAGNOSIS — I69354 Hemiplegia and hemiparesis following cerebral infarction affecting left non-dominant side: Secondary | ICD-10-CM | POA: Diagnosis not present

## 2015-09-16 DIAGNOSIS — I2510 Atherosclerotic heart disease of native coronary artery without angina pectoris: Secondary | ICD-10-CM | POA: Diagnosis not present

## 2015-09-16 DIAGNOSIS — G8100 Flaccid hemiplegia affecting unspecified side: Secondary | ICD-10-CM | POA: Diagnosis not present

## 2015-09-16 DIAGNOSIS — I6930 Unspecified sequelae of cerebral infarction: Secondary | ICD-10-CM | POA: Diagnosis not present

## 2015-09-16 DIAGNOSIS — R1312 Dysphagia, oropharyngeal phase: Secondary | ICD-10-CM | POA: Diagnosis not present

## 2015-10-20 DIAGNOSIS — Z9981 Dependence on supplemental oxygen: Secondary | ICD-10-CM | POA: Diagnosis not present

## 2015-10-20 DIAGNOSIS — I10 Essential (primary) hypertension: Secondary | ICD-10-CM | POA: Diagnosis not present

## 2015-10-20 DIAGNOSIS — E782 Mixed hyperlipidemia: Secondary | ICD-10-CM | POA: Diagnosis not present

## 2015-10-20 DIAGNOSIS — I2510 Atherosclerotic heart disease of native coronary artery without angina pectoris: Secondary | ICD-10-CM | POA: Diagnosis not present

## 2015-11-10 DIAGNOSIS — R630 Anorexia: Secondary | ICD-10-CM | POA: Diagnosis not present

## 2015-11-10 DIAGNOSIS — Z7901 Long term (current) use of anticoagulants: Secondary | ICD-10-CM | POA: Diagnosis not present

## 2015-11-10 DIAGNOSIS — R972 Elevated prostate specific antigen [PSA]: Secondary | ICD-10-CM | POA: Diagnosis not present

## 2015-11-10 DIAGNOSIS — Z9981 Dependence on supplemental oxygen: Secondary | ICD-10-CM | POA: Diagnosis not present

## 2015-11-10 DIAGNOSIS — I10 Essential (primary) hypertension: Secondary | ICD-10-CM | POA: Diagnosis not present

## 2015-11-10 DIAGNOSIS — I69364 Other paralytic syndrome following cerebral infarction affecting left non-dominant side: Secondary | ICD-10-CM | POA: Diagnosis not present

## 2015-11-10 DIAGNOSIS — R1312 Dysphagia, oropharyngeal phase: Secondary | ICD-10-CM | POA: Diagnosis not present

## 2015-11-10 DIAGNOSIS — R634 Abnormal weight loss: Secondary | ICD-10-CM | POA: Diagnosis not present

## 2015-11-10 DIAGNOSIS — I69391 Dysphagia following cerebral infarction: Secondary | ICD-10-CM | POA: Diagnosis not present

## 2015-11-10 DIAGNOSIS — I2510 Atherosclerotic heart disease of native coronary artery without angina pectoris: Secondary | ICD-10-CM | POA: Diagnosis not present

## 2015-11-10 DIAGNOSIS — Z993 Dependence on wheelchair: Secondary | ICD-10-CM | POA: Diagnosis not present

## 2015-11-10 DIAGNOSIS — I69320 Aphasia following cerebral infarction: Secondary | ICD-10-CM | POA: Diagnosis not present

## 2015-11-10 DIAGNOSIS — N400 Enlarged prostate without lower urinary tract symptoms: Secondary | ICD-10-CM | POA: Diagnosis not present

## 2015-11-10 DIAGNOSIS — Z931 Gastrostomy status: Secondary | ICD-10-CM | POA: Diagnosis not present

## 2015-11-10 DIAGNOSIS — L89321 Pressure ulcer of left buttock, stage 1: Secondary | ICD-10-CM | POA: Diagnosis not present

## 2015-11-19 DIAGNOSIS — I10 Essential (primary) hypertension: Secondary | ICD-10-CM | POA: Diagnosis not present

## 2015-11-19 DIAGNOSIS — R630 Anorexia: Secondary | ICD-10-CM | POA: Diagnosis not present

## 2015-11-19 DIAGNOSIS — I69364 Other paralytic syndrome following cerebral infarction affecting left non-dominant side: Secondary | ICD-10-CM | POA: Diagnosis not present

## 2015-11-19 DIAGNOSIS — I69391 Dysphagia following cerebral infarction: Secondary | ICD-10-CM | POA: Diagnosis not present

## 2015-11-19 DIAGNOSIS — I2510 Atherosclerotic heart disease of native coronary artery without angina pectoris: Secondary | ICD-10-CM | POA: Diagnosis not present

## 2015-11-19 DIAGNOSIS — R1312 Dysphagia, oropharyngeal phase: Secondary | ICD-10-CM | POA: Diagnosis not present

## 2015-11-27 DIAGNOSIS — R1312 Dysphagia, oropharyngeal phase: Secondary | ICD-10-CM | POA: Diagnosis not present

## 2015-11-27 DIAGNOSIS — I2510 Atherosclerotic heart disease of native coronary artery without angina pectoris: Secondary | ICD-10-CM | POA: Diagnosis not present

## 2015-11-27 DIAGNOSIS — I69391 Dysphagia following cerebral infarction: Secondary | ICD-10-CM | POA: Diagnosis not present

## 2015-11-27 DIAGNOSIS — I69364 Other paralytic syndrome following cerebral infarction affecting left non-dominant side: Secondary | ICD-10-CM | POA: Diagnosis not present

## 2015-11-27 DIAGNOSIS — I10 Essential (primary) hypertension: Secondary | ICD-10-CM | POA: Diagnosis not present

## 2015-11-27 DIAGNOSIS — R630 Anorexia: Secondary | ICD-10-CM | POA: Diagnosis not present

## 2015-11-30 DIAGNOSIS — R1312 Dysphagia, oropharyngeal phase: Secondary | ICD-10-CM | POA: Diagnosis not present

## 2015-11-30 DIAGNOSIS — R630 Anorexia: Secondary | ICD-10-CM | POA: Diagnosis not present

## 2015-11-30 DIAGNOSIS — R634 Abnormal weight loss: Secondary | ICD-10-CM | POA: Diagnosis not present

## 2015-11-30 DIAGNOSIS — R972 Elevated prostate specific antigen [PSA]: Secondary | ICD-10-CM | POA: Diagnosis not present

## 2015-11-30 DIAGNOSIS — I69391 Dysphagia following cerebral infarction: Secondary | ICD-10-CM | POA: Diagnosis not present

## 2015-11-30 DIAGNOSIS — I69364 Other paralytic syndrome following cerebral infarction affecting left non-dominant side: Secondary | ICD-10-CM | POA: Diagnosis not present

## 2015-11-30 DIAGNOSIS — I2510 Atherosclerotic heart disease of native coronary artery without angina pectoris: Secondary | ICD-10-CM | POA: Diagnosis not present

## 2015-11-30 DIAGNOSIS — I10 Essential (primary) hypertension: Secondary | ICD-10-CM | POA: Diagnosis not present

## 2015-12-08 DIAGNOSIS — I2510 Atherosclerotic heart disease of native coronary artery without angina pectoris: Secondary | ICD-10-CM | POA: Diagnosis not present

## 2015-12-08 DIAGNOSIS — I10 Essential (primary) hypertension: Secondary | ICD-10-CM | POA: Diagnosis not present

## 2015-12-08 DIAGNOSIS — I69391 Dysphagia following cerebral infarction: Secondary | ICD-10-CM | POA: Diagnosis not present

## 2015-12-08 DIAGNOSIS — I69364 Other paralytic syndrome following cerebral infarction affecting left non-dominant side: Secondary | ICD-10-CM | POA: Diagnosis not present

## 2015-12-08 DIAGNOSIS — R1312 Dysphagia, oropharyngeal phase: Secondary | ICD-10-CM | POA: Diagnosis not present

## 2015-12-08 DIAGNOSIS — R630 Anorexia: Secondary | ICD-10-CM | POA: Diagnosis not present

## 2015-12-10 DIAGNOSIS — I2510 Atherosclerotic heart disease of native coronary artery without angina pectoris: Secondary | ICD-10-CM | POA: Diagnosis not present

## 2015-12-10 DIAGNOSIS — I10 Essential (primary) hypertension: Secondary | ICD-10-CM | POA: Diagnosis not present

## 2015-12-10 DIAGNOSIS — Z7189 Other specified counseling: Secondary | ICD-10-CM | POA: Diagnosis not present

## 2015-12-10 DIAGNOSIS — Z993 Dependence on wheelchair: Secondary | ICD-10-CM | POA: Diagnosis not present

## 2015-12-10 DIAGNOSIS — E782 Mixed hyperlipidemia: Secondary | ICD-10-CM | POA: Diagnosis not present

## 2015-12-18 DIAGNOSIS — R630 Anorexia: Secondary | ICD-10-CM | POA: Diagnosis not present

## 2015-12-18 DIAGNOSIS — I69364 Other paralytic syndrome following cerebral infarction affecting left non-dominant side: Secondary | ICD-10-CM | POA: Diagnosis not present

## 2015-12-18 DIAGNOSIS — I2510 Atherosclerotic heart disease of native coronary artery without angina pectoris: Secondary | ICD-10-CM | POA: Diagnosis not present

## 2015-12-18 DIAGNOSIS — I69391 Dysphagia following cerebral infarction: Secondary | ICD-10-CM | POA: Diagnosis not present

## 2015-12-18 DIAGNOSIS — I10 Essential (primary) hypertension: Secondary | ICD-10-CM | POA: Diagnosis not present

## 2015-12-18 DIAGNOSIS — R1312 Dysphagia, oropharyngeal phase: Secondary | ICD-10-CM | POA: Diagnosis not present

## 2015-12-25 DIAGNOSIS — I10 Essential (primary) hypertension: Secondary | ICD-10-CM | POA: Diagnosis not present

## 2015-12-25 DIAGNOSIS — I69364 Other paralytic syndrome following cerebral infarction affecting left non-dominant side: Secondary | ICD-10-CM | POA: Diagnosis not present

## 2015-12-25 DIAGNOSIS — R1312 Dysphagia, oropharyngeal phase: Secondary | ICD-10-CM | POA: Diagnosis not present

## 2015-12-25 DIAGNOSIS — I69391 Dysphagia following cerebral infarction: Secondary | ICD-10-CM | POA: Diagnosis not present

## 2015-12-25 DIAGNOSIS — R630 Anorexia: Secondary | ICD-10-CM | POA: Diagnosis not present

## 2015-12-25 DIAGNOSIS — I2510 Atherosclerotic heart disease of native coronary artery without angina pectoris: Secondary | ICD-10-CM | POA: Diagnosis not present

## 2015-12-31 DIAGNOSIS — R630 Anorexia: Secondary | ICD-10-CM | POA: Diagnosis not present

## 2015-12-31 DIAGNOSIS — I2510 Atherosclerotic heart disease of native coronary artery without angina pectoris: Secondary | ICD-10-CM | POA: Diagnosis not present

## 2015-12-31 DIAGNOSIS — R1312 Dysphagia, oropharyngeal phase: Secondary | ICD-10-CM | POA: Diagnosis not present

## 2015-12-31 DIAGNOSIS — I10 Essential (primary) hypertension: Secondary | ICD-10-CM | POA: Diagnosis not present

## 2015-12-31 DIAGNOSIS — I69391 Dysphagia following cerebral infarction: Secondary | ICD-10-CM | POA: Diagnosis not present

## 2015-12-31 DIAGNOSIS — I69364 Other paralytic syndrome following cerebral infarction affecting left non-dominant side: Secondary | ICD-10-CM | POA: Diagnosis not present

## 2016-01-06 DIAGNOSIS — Z993 Dependence on wheelchair: Secondary | ICD-10-CM | POA: Diagnosis not present

## 2016-01-06 DIAGNOSIS — I10 Essential (primary) hypertension: Secondary | ICD-10-CM | POA: Diagnosis not present

## 2016-01-06 DIAGNOSIS — J9610 Chronic respiratory failure, unspecified whether with hypoxia or hypercapnia: Secondary | ICD-10-CM | POA: Diagnosis not present

## 2016-01-06 DIAGNOSIS — I2510 Atherosclerotic heart disease of native coronary artery without angina pectoris: Secondary | ICD-10-CM | POA: Diagnosis not present

## 2016-01-07 DIAGNOSIS — I69364 Other paralytic syndrome following cerebral infarction affecting left non-dominant side: Secondary | ICD-10-CM | POA: Diagnosis not present

## 2016-01-07 DIAGNOSIS — R630 Anorexia: Secondary | ICD-10-CM | POA: Diagnosis not present

## 2016-01-07 DIAGNOSIS — I2510 Atherosclerotic heart disease of native coronary artery without angina pectoris: Secondary | ICD-10-CM | POA: Diagnosis not present

## 2016-01-07 DIAGNOSIS — R1312 Dysphagia, oropharyngeal phase: Secondary | ICD-10-CM | POA: Diagnosis not present

## 2016-01-07 DIAGNOSIS — I10 Essential (primary) hypertension: Secondary | ICD-10-CM | POA: Diagnosis not present

## 2016-01-07 DIAGNOSIS — I69391 Dysphagia following cerebral infarction: Secondary | ICD-10-CM | POA: Diagnosis not present

## 2016-01-09 DIAGNOSIS — R1312 Dysphagia, oropharyngeal phase: Secondary | ICD-10-CM | POA: Diagnosis not present

## 2016-01-09 DIAGNOSIS — R634 Abnormal weight loss: Secondary | ICD-10-CM | POA: Diagnosis not present

## 2016-01-09 DIAGNOSIS — Z993 Dependence on wheelchair: Secondary | ICD-10-CM | POA: Diagnosis not present

## 2016-01-09 DIAGNOSIS — Z931 Gastrostomy status: Secondary | ICD-10-CM | POA: Diagnosis not present

## 2016-01-09 DIAGNOSIS — I69391 Dysphagia following cerebral infarction: Secondary | ICD-10-CM | POA: Diagnosis not present

## 2016-01-09 DIAGNOSIS — N400 Enlarged prostate without lower urinary tract symptoms: Secondary | ICD-10-CM | POA: Diagnosis not present

## 2016-01-09 DIAGNOSIS — I69320 Aphasia following cerebral infarction: Secondary | ICD-10-CM | POA: Diagnosis not present

## 2016-01-09 DIAGNOSIS — I10 Essential (primary) hypertension: Secondary | ICD-10-CM | POA: Diagnosis not present

## 2016-01-09 DIAGNOSIS — I2510 Atherosclerotic heart disease of native coronary artery without angina pectoris: Secondary | ICD-10-CM | POA: Diagnosis not present

## 2016-01-09 DIAGNOSIS — R630 Anorexia: Secondary | ICD-10-CM | POA: Diagnosis not present

## 2016-01-09 DIAGNOSIS — I69364 Other paralytic syndrome following cerebral infarction affecting left non-dominant side: Secondary | ICD-10-CM | POA: Diagnosis not present

## 2016-01-09 DIAGNOSIS — Z9981 Dependence on supplemental oxygen: Secondary | ICD-10-CM | POA: Diagnosis not present

## 2016-01-09 DIAGNOSIS — Z7901 Long term (current) use of anticoagulants: Secondary | ICD-10-CM | POA: Diagnosis not present

## 2016-01-09 DIAGNOSIS — R972 Elevated prostate specific antigen [PSA]: Secondary | ICD-10-CM | POA: Diagnosis not present

## 2016-01-15 DIAGNOSIS — I69364 Other paralytic syndrome following cerebral infarction affecting left non-dominant side: Secondary | ICD-10-CM | POA: Diagnosis not present

## 2016-01-15 DIAGNOSIS — I10 Essential (primary) hypertension: Secondary | ICD-10-CM | POA: Diagnosis not present

## 2016-01-15 DIAGNOSIS — R1312 Dysphagia, oropharyngeal phase: Secondary | ICD-10-CM | POA: Diagnosis not present

## 2016-01-15 DIAGNOSIS — I69391 Dysphagia following cerebral infarction: Secondary | ICD-10-CM | POA: Diagnosis not present

## 2016-01-15 DIAGNOSIS — R630 Anorexia: Secondary | ICD-10-CM | POA: Diagnosis not present

## 2016-01-15 DIAGNOSIS — I2510 Atherosclerotic heart disease of native coronary artery without angina pectoris: Secondary | ICD-10-CM | POA: Diagnosis not present

## 2016-01-19 DIAGNOSIS — I10 Essential (primary) hypertension: Secondary | ICD-10-CM | POA: Diagnosis not present

## 2016-01-19 DIAGNOSIS — R1312 Dysphagia, oropharyngeal phase: Secondary | ICD-10-CM | POA: Diagnosis not present

## 2016-01-19 DIAGNOSIS — R630 Anorexia: Secondary | ICD-10-CM | POA: Diagnosis not present

## 2016-01-19 DIAGNOSIS — I69364 Other paralytic syndrome following cerebral infarction affecting left non-dominant side: Secondary | ICD-10-CM | POA: Diagnosis not present

## 2016-01-19 DIAGNOSIS — I69391 Dysphagia following cerebral infarction: Secondary | ICD-10-CM | POA: Diagnosis not present

## 2016-01-19 DIAGNOSIS — I2510 Atherosclerotic heart disease of native coronary artery without angina pectoris: Secondary | ICD-10-CM | POA: Diagnosis not present

## 2016-01-22 DIAGNOSIS — I69391 Dysphagia following cerebral infarction: Secondary | ICD-10-CM | POA: Diagnosis not present

## 2016-01-22 DIAGNOSIS — I10 Essential (primary) hypertension: Secondary | ICD-10-CM | POA: Diagnosis not present

## 2016-01-22 DIAGNOSIS — R630 Anorexia: Secondary | ICD-10-CM | POA: Diagnosis not present

## 2016-01-22 DIAGNOSIS — I69364 Other paralytic syndrome following cerebral infarction affecting left non-dominant side: Secondary | ICD-10-CM | POA: Diagnosis not present

## 2016-01-22 DIAGNOSIS — I2510 Atherosclerotic heart disease of native coronary artery without angina pectoris: Secondary | ICD-10-CM | POA: Diagnosis not present

## 2016-01-22 DIAGNOSIS — R1312 Dysphagia, oropharyngeal phase: Secondary | ICD-10-CM | POA: Diagnosis not present

## 2016-01-26 DIAGNOSIS — I69391 Dysphagia following cerebral infarction: Secondary | ICD-10-CM | POA: Diagnosis not present

## 2016-01-26 DIAGNOSIS — R630 Anorexia: Secondary | ICD-10-CM | POA: Diagnosis not present

## 2016-01-26 DIAGNOSIS — R1312 Dysphagia, oropharyngeal phase: Secondary | ICD-10-CM | POA: Diagnosis not present

## 2016-01-26 DIAGNOSIS — I69364 Other paralytic syndrome following cerebral infarction affecting left non-dominant side: Secondary | ICD-10-CM | POA: Diagnosis not present

## 2016-01-26 DIAGNOSIS — I10 Essential (primary) hypertension: Secondary | ICD-10-CM | POA: Diagnosis not present

## 2016-01-26 DIAGNOSIS — I2510 Atherosclerotic heart disease of native coronary artery without angina pectoris: Secondary | ICD-10-CM | POA: Diagnosis not present

## 2016-01-28 DIAGNOSIS — I10 Essential (primary) hypertension: Secondary | ICD-10-CM | POA: Diagnosis not present

## 2016-01-28 DIAGNOSIS — I69391 Dysphagia following cerebral infarction: Secondary | ICD-10-CM | POA: Diagnosis not present

## 2016-01-28 DIAGNOSIS — I69364 Other paralytic syndrome following cerebral infarction affecting left non-dominant side: Secondary | ICD-10-CM | POA: Diagnosis not present

## 2016-01-28 DIAGNOSIS — I2510 Atherosclerotic heart disease of native coronary artery without angina pectoris: Secondary | ICD-10-CM | POA: Diagnosis not present

## 2016-01-28 DIAGNOSIS — R1312 Dysphagia, oropharyngeal phase: Secondary | ICD-10-CM | POA: Diagnosis not present

## 2016-01-28 DIAGNOSIS — R630 Anorexia: Secondary | ICD-10-CM | POA: Diagnosis not present

## 2016-01-29 DIAGNOSIS — I10 Essential (primary) hypertension: Secondary | ICD-10-CM | POA: Diagnosis not present

## 2016-01-29 DIAGNOSIS — I2510 Atherosclerotic heart disease of native coronary artery without angina pectoris: Secondary | ICD-10-CM | POA: Diagnosis not present

## 2016-01-29 DIAGNOSIS — R1312 Dysphagia, oropharyngeal phase: Secondary | ICD-10-CM | POA: Diagnosis not present

## 2016-01-29 DIAGNOSIS — I69364 Other paralytic syndrome following cerebral infarction affecting left non-dominant side: Secondary | ICD-10-CM | POA: Diagnosis not present

## 2016-01-29 DIAGNOSIS — I69391 Dysphagia following cerebral infarction: Secondary | ICD-10-CM | POA: Diagnosis not present

## 2016-01-29 DIAGNOSIS — R630 Anorexia: Secondary | ICD-10-CM | POA: Diagnosis not present

## 2016-02-02 DIAGNOSIS — R630 Anorexia: Secondary | ICD-10-CM | POA: Diagnosis not present

## 2016-02-02 DIAGNOSIS — R1312 Dysphagia, oropharyngeal phase: Secondary | ICD-10-CM | POA: Diagnosis not present

## 2016-02-02 DIAGNOSIS — I69391 Dysphagia following cerebral infarction: Secondary | ICD-10-CM | POA: Diagnosis not present

## 2016-02-02 DIAGNOSIS — I69364 Other paralytic syndrome following cerebral infarction affecting left non-dominant side: Secondary | ICD-10-CM | POA: Diagnosis not present

## 2016-02-02 DIAGNOSIS — I2510 Atherosclerotic heart disease of native coronary artery without angina pectoris: Secondary | ICD-10-CM | POA: Diagnosis not present

## 2016-02-02 DIAGNOSIS — I10 Essential (primary) hypertension: Secondary | ICD-10-CM | POA: Diagnosis not present

## 2016-02-04 DIAGNOSIS — I69364 Other paralytic syndrome following cerebral infarction affecting left non-dominant side: Secondary | ICD-10-CM | POA: Diagnosis not present

## 2016-02-04 DIAGNOSIS — R1312 Dysphagia, oropharyngeal phase: Secondary | ICD-10-CM | POA: Diagnosis not present

## 2016-02-04 DIAGNOSIS — I2510 Atherosclerotic heart disease of native coronary artery without angina pectoris: Secondary | ICD-10-CM | POA: Diagnosis not present

## 2016-02-04 DIAGNOSIS — R630 Anorexia: Secondary | ICD-10-CM | POA: Diagnosis not present

## 2016-02-04 DIAGNOSIS — I10 Essential (primary) hypertension: Secondary | ICD-10-CM | POA: Diagnosis not present

## 2016-02-04 DIAGNOSIS — I69391 Dysphagia following cerebral infarction: Secondary | ICD-10-CM | POA: Diagnosis not present

## 2016-02-05 DIAGNOSIS — I69391 Dysphagia following cerebral infarction: Secondary | ICD-10-CM | POA: Diagnosis not present

## 2016-02-05 DIAGNOSIS — I2510 Atherosclerotic heart disease of native coronary artery without angina pectoris: Secondary | ICD-10-CM | POA: Diagnosis not present

## 2016-02-05 DIAGNOSIS — R1312 Dysphagia, oropharyngeal phase: Secondary | ICD-10-CM | POA: Diagnosis not present

## 2016-02-05 DIAGNOSIS — I10 Essential (primary) hypertension: Secondary | ICD-10-CM | POA: Diagnosis not present

## 2016-02-05 DIAGNOSIS — R630 Anorexia: Secondary | ICD-10-CM | POA: Diagnosis not present

## 2016-02-05 DIAGNOSIS — I69364 Other paralytic syndrome following cerebral infarction affecting left non-dominant side: Secondary | ICD-10-CM | POA: Diagnosis not present

## 2016-02-09 DIAGNOSIS — R1312 Dysphagia, oropharyngeal phase: Secondary | ICD-10-CM | POA: Diagnosis not present

## 2016-02-09 DIAGNOSIS — I10 Essential (primary) hypertension: Secondary | ICD-10-CM | POA: Diagnosis not present

## 2016-02-09 DIAGNOSIS — I69391 Dysphagia following cerebral infarction: Secondary | ICD-10-CM | POA: Diagnosis not present

## 2016-02-09 DIAGNOSIS — R630 Anorexia: Secondary | ICD-10-CM | POA: Diagnosis not present

## 2016-02-09 DIAGNOSIS — I69364 Other paralytic syndrome following cerebral infarction affecting left non-dominant side: Secondary | ICD-10-CM | POA: Diagnosis not present

## 2016-02-09 DIAGNOSIS — I2510 Atherosclerotic heart disease of native coronary artery without angina pectoris: Secondary | ICD-10-CM | POA: Diagnosis not present

## 2016-02-11 DIAGNOSIS — R630 Anorexia: Secondary | ICD-10-CM | POA: Diagnosis not present

## 2016-02-11 DIAGNOSIS — I2510 Atherosclerotic heart disease of native coronary artery without angina pectoris: Secondary | ICD-10-CM | POA: Diagnosis not present

## 2016-02-11 DIAGNOSIS — R1312 Dysphagia, oropharyngeal phase: Secondary | ICD-10-CM | POA: Diagnosis not present

## 2016-02-11 DIAGNOSIS — I10 Essential (primary) hypertension: Secondary | ICD-10-CM | POA: Diagnosis not present

## 2016-02-11 DIAGNOSIS — I69391 Dysphagia following cerebral infarction: Secondary | ICD-10-CM | POA: Diagnosis not present

## 2016-02-11 DIAGNOSIS — I69364 Other paralytic syndrome following cerebral infarction affecting left non-dominant side: Secondary | ICD-10-CM | POA: Diagnosis not present

## 2016-02-12 DIAGNOSIS — I69391 Dysphagia following cerebral infarction: Secondary | ICD-10-CM | POA: Diagnosis not present

## 2016-02-12 DIAGNOSIS — R630 Anorexia: Secondary | ICD-10-CM | POA: Diagnosis not present

## 2016-02-12 DIAGNOSIS — R1312 Dysphagia, oropharyngeal phase: Secondary | ICD-10-CM | POA: Diagnosis not present

## 2016-02-12 DIAGNOSIS — I10 Essential (primary) hypertension: Secondary | ICD-10-CM | POA: Diagnosis not present

## 2016-02-12 DIAGNOSIS — I69364 Other paralytic syndrome following cerebral infarction affecting left non-dominant side: Secondary | ICD-10-CM | POA: Diagnosis not present

## 2016-02-12 DIAGNOSIS — I2510 Atherosclerotic heart disease of native coronary artery without angina pectoris: Secondary | ICD-10-CM | POA: Diagnosis not present

## 2016-02-16 DIAGNOSIS — I10 Essential (primary) hypertension: Secondary | ICD-10-CM | POA: Diagnosis not present

## 2016-02-16 DIAGNOSIS — R630 Anorexia: Secondary | ICD-10-CM | POA: Diagnosis not present

## 2016-02-16 DIAGNOSIS — I69391 Dysphagia following cerebral infarction: Secondary | ICD-10-CM | POA: Diagnosis not present

## 2016-02-16 DIAGNOSIS — I69364 Other paralytic syndrome following cerebral infarction affecting left non-dominant side: Secondary | ICD-10-CM | POA: Diagnosis not present

## 2016-02-16 DIAGNOSIS — R1312 Dysphagia, oropharyngeal phase: Secondary | ICD-10-CM | POA: Diagnosis not present

## 2016-02-16 DIAGNOSIS — I2510 Atherosclerotic heart disease of native coronary artery without angina pectoris: Secondary | ICD-10-CM | POA: Diagnosis not present

## 2016-02-17 DIAGNOSIS — R1312 Dysphagia, oropharyngeal phase: Secondary | ICD-10-CM | POA: Diagnosis not present

## 2016-02-17 DIAGNOSIS — I69364 Other paralytic syndrome following cerebral infarction affecting left non-dominant side: Secondary | ICD-10-CM | POA: Diagnosis not present

## 2016-02-17 DIAGNOSIS — I2510 Atherosclerotic heart disease of native coronary artery without angina pectoris: Secondary | ICD-10-CM | POA: Diagnosis not present

## 2016-02-17 DIAGNOSIS — R630 Anorexia: Secondary | ICD-10-CM | POA: Diagnosis not present

## 2016-02-17 DIAGNOSIS — I69391 Dysphagia following cerebral infarction: Secondary | ICD-10-CM | POA: Diagnosis not present

## 2016-02-17 DIAGNOSIS — I10 Essential (primary) hypertension: Secondary | ICD-10-CM | POA: Diagnosis not present

## 2016-02-18 DIAGNOSIS — I10 Essential (primary) hypertension: Secondary | ICD-10-CM | POA: Diagnosis not present

## 2016-02-18 DIAGNOSIS — I2510 Atherosclerotic heart disease of native coronary artery without angina pectoris: Secondary | ICD-10-CM | POA: Diagnosis not present

## 2016-02-18 DIAGNOSIS — R1312 Dysphagia, oropharyngeal phase: Secondary | ICD-10-CM | POA: Diagnosis not present

## 2016-02-18 DIAGNOSIS — I69391 Dysphagia following cerebral infarction: Secondary | ICD-10-CM | POA: Diagnosis not present

## 2016-02-18 DIAGNOSIS — I69364 Other paralytic syndrome following cerebral infarction affecting left non-dominant side: Secondary | ICD-10-CM | POA: Diagnosis not present

## 2016-02-18 DIAGNOSIS — R630 Anorexia: Secondary | ICD-10-CM | POA: Diagnosis not present

## 2016-02-27 DIAGNOSIS — R1312 Dysphagia, oropharyngeal phase: Secondary | ICD-10-CM | POA: Diagnosis not present

## 2016-02-27 DIAGNOSIS — R630 Anorexia: Secondary | ICD-10-CM | POA: Diagnosis not present

## 2016-02-27 DIAGNOSIS — I10 Essential (primary) hypertension: Secondary | ICD-10-CM | POA: Diagnosis not present

## 2016-02-27 DIAGNOSIS — I2510 Atherosclerotic heart disease of native coronary artery without angina pectoris: Secondary | ICD-10-CM | POA: Diagnosis not present

## 2016-02-27 DIAGNOSIS — I69364 Other paralytic syndrome following cerebral infarction affecting left non-dominant side: Secondary | ICD-10-CM | POA: Diagnosis not present

## 2016-02-27 DIAGNOSIS — I69391 Dysphagia following cerebral infarction: Secondary | ICD-10-CM | POA: Diagnosis not present

## 2016-03-04 DIAGNOSIS — I10 Essential (primary) hypertension: Secondary | ICD-10-CM | POA: Diagnosis not present

## 2016-03-04 DIAGNOSIS — I2510 Atherosclerotic heart disease of native coronary artery without angina pectoris: Secondary | ICD-10-CM | POA: Diagnosis not present

## 2016-03-04 DIAGNOSIS — I69391 Dysphagia following cerebral infarction: Secondary | ICD-10-CM | POA: Diagnosis not present

## 2016-03-04 DIAGNOSIS — R630 Anorexia: Secondary | ICD-10-CM | POA: Diagnosis not present

## 2016-03-04 DIAGNOSIS — R1312 Dysphagia, oropharyngeal phase: Secondary | ICD-10-CM | POA: Diagnosis not present

## 2016-03-04 DIAGNOSIS — I69364 Other paralytic syndrome following cerebral infarction affecting left non-dominant side: Secondary | ICD-10-CM | POA: Diagnosis not present

## 2016-03-09 DIAGNOSIS — I69364 Other paralytic syndrome following cerebral infarction affecting left non-dominant side: Secondary | ICD-10-CM | POA: Diagnosis not present

## 2016-03-09 DIAGNOSIS — I69391 Dysphagia following cerebral infarction: Secondary | ICD-10-CM | POA: Diagnosis not present

## 2016-03-09 DIAGNOSIS — I2510 Atherosclerotic heart disease of native coronary artery without angina pectoris: Secondary | ICD-10-CM | POA: Diagnosis not present

## 2016-03-09 DIAGNOSIS — Z139 Encounter for screening, unspecified: Secondary | ICD-10-CM | POA: Diagnosis not present

## 2016-03-09 DIAGNOSIS — R630 Anorexia: Secondary | ICD-10-CM | POA: Diagnosis not present

## 2016-03-09 DIAGNOSIS — Z1389 Encounter for screening for other disorder: Secondary | ICD-10-CM | POA: Diagnosis not present

## 2016-03-09 DIAGNOSIS — Z0000 Encounter for general adult medical examination without abnormal findings: Secondary | ICD-10-CM | POA: Diagnosis not present

## 2016-03-09 DIAGNOSIS — Z23 Encounter for immunization: Secondary | ICD-10-CM | POA: Diagnosis not present

## 2016-03-09 DIAGNOSIS — R1312 Dysphagia, oropharyngeal phase: Secondary | ICD-10-CM | POA: Diagnosis not present

## 2016-03-09 DIAGNOSIS — I10 Essential (primary) hypertension: Secondary | ICD-10-CM | POA: Diagnosis not present

## 2016-03-16 DIAGNOSIS — I10 Essential (primary) hypertension: Secondary | ICD-10-CM | POA: Diagnosis not present

## 2016-03-16 DIAGNOSIS — I2510 Atherosclerotic heart disease of native coronary artery without angina pectoris: Secondary | ICD-10-CM | POA: Diagnosis not present

## 2016-03-16 DIAGNOSIS — I69391 Dysphagia following cerebral infarction: Secondary | ICD-10-CM | POA: Diagnosis not present

## 2016-03-16 DIAGNOSIS — I69364 Other paralytic syndrome following cerebral infarction affecting left non-dominant side: Secondary | ICD-10-CM | POA: Diagnosis not present

## 2016-03-16 DIAGNOSIS — R630 Anorexia: Secondary | ICD-10-CM | POA: Diagnosis not present

## 2016-03-16 DIAGNOSIS — R1312 Dysphagia, oropharyngeal phase: Secondary | ICD-10-CM | POA: Diagnosis not present

## 2016-03-29 DIAGNOSIS — I10 Essential (primary) hypertension: Secondary | ICD-10-CM | POA: Diagnosis not present

## 2016-03-29 DIAGNOSIS — I69391 Dysphagia following cerebral infarction: Secondary | ICD-10-CM | POA: Diagnosis not present

## 2016-03-29 DIAGNOSIS — R1312 Dysphagia, oropharyngeal phase: Secondary | ICD-10-CM | POA: Diagnosis not present

## 2016-03-29 DIAGNOSIS — I69364 Other paralytic syndrome following cerebral infarction affecting left non-dominant side: Secondary | ICD-10-CM | POA: Diagnosis not present

## 2016-03-29 DIAGNOSIS — I2510 Atherosclerotic heart disease of native coronary artery without angina pectoris: Secondary | ICD-10-CM | POA: Diagnosis not present

## 2016-03-29 DIAGNOSIS — R630 Anorexia: Secondary | ICD-10-CM | POA: Diagnosis not present

## 2016-04-12 DIAGNOSIS — R1312 Dysphagia, oropharyngeal phase: Secondary | ICD-10-CM | POA: Diagnosis not present

## 2016-04-12 DIAGNOSIS — R630 Anorexia: Secondary | ICD-10-CM | POA: Diagnosis not present

## 2016-04-12 DIAGNOSIS — I10 Essential (primary) hypertension: Secondary | ICD-10-CM | POA: Diagnosis not present

## 2016-04-12 DIAGNOSIS — I69364 Other paralytic syndrome following cerebral infarction affecting left non-dominant side: Secondary | ICD-10-CM | POA: Diagnosis not present

## 2016-04-12 DIAGNOSIS — I69391 Dysphagia following cerebral infarction: Secondary | ICD-10-CM | POA: Diagnosis not present

## 2016-04-12 DIAGNOSIS — I2510 Atherosclerotic heart disease of native coronary artery without angina pectoris: Secondary | ICD-10-CM | POA: Diagnosis not present

## 2016-04-27 DIAGNOSIS — I69364 Other paralytic syndrome following cerebral infarction affecting left non-dominant side: Secondary | ICD-10-CM | POA: Diagnosis not present

## 2016-04-27 DIAGNOSIS — I2510 Atherosclerotic heart disease of native coronary artery without angina pectoris: Secondary | ICD-10-CM | POA: Diagnosis not present

## 2016-04-27 DIAGNOSIS — R1312 Dysphagia, oropharyngeal phase: Secondary | ICD-10-CM | POA: Diagnosis not present

## 2016-04-27 DIAGNOSIS — I10 Essential (primary) hypertension: Secondary | ICD-10-CM | POA: Diagnosis not present

## 2016-04-27 DIAGNOSIS — R630 Anorexia: Secondary | ICD-10-CM | POA: Diagnosis not present

## 2016-04-27 DIAGNOSIS — I69391 Dysphagia following cerebral infarction: Secondary | ICD-10-CM | POA: Diagnosis not present

## 2016-05-06 DIAGNOSIS — R630 Anorexia: Secondary | ICD-10-CM | POA: Diagnosis not present

## 2016-05-06 DIAGNOSIS — I69391 Dysphagia following cerebral infarction: Secondary | ICD-10-CM | POA: Diagnosis not present

## 2016-05-06 DIAGNOSIS — R1312 Dysphagia, oropharyngeal phase: Secondary | ICD-10-CM | POA: Diagnosis not present

## 2016-05-06 DIAGNOSIS — I10 Essential (primary) hypertension: Secondary | ICD-10-CM | POA: Diagnosis not present

## 2016-05-06 DIAGNOSIS — I2510 Atherosclerotic heart disease of native coronary artery without angina pectoris: Secondary | ICD-10-CM | POA: Diagnosis not present

## 2016-05-06 DIAGNOSIS — I69364 Other paralytic syndrome following cerebral infarction affecting left non-dominant side: Secondary | ICD-10-CM | POA: Diagnosis not present

## 2016-05-08 DIAGNOSIS — I2510 Atherosclerotic heart disease of native coronary artery without angina pectoris: Secondary | ICD-10-CM | POA: Diagnosis not present

## 2016-05-08 DIAGNOSIS — I69364 Other paralytic syndrome following cerebral infarction affecting left non-dominant side: Secondary | ICD-10-CM | POA: Diagnosis not present

## 2016-05-08 DIAGNOSIS — R630 Anorexia: Secondary | ICD-10-CM | POA: Diagnosis not present

## 2016-05-08 DIAGNOSIS — I10 Essential (primary) hypertension: Secondary | ICD-10-CM | POA: Diagnosis not present

## 2016-05-08 DIAGNOSIS — I69391 Dysphagia following cerebral infarction: Secondary | ICD-10-CM | POA: Diagnosis not present

## 2016-05-08 DIAGNOSIS — R1312 Dysphagia, oropharyngeal phase: Secondary | ICD-10-CM | POA: Diagnosis not present

## 2016-05-18 DIAGNOSIS — I69391 Dysphagia following cerebral infarction: Secondary | ICD-10-CM | POA: Diagnosis not present

## 2016-05-18 DIAGNOSIS — R1312 Dysphagia, oropharyngeal phase: Secondary | ICD-10-CM | POA: Diagnosis not present

## 2016-05-18 DIAGNOSIS — I10 Essential (primary) hypertension: Secondary | ICD-10-CM | POA: Diagnosis not present

## 2016-05-18 DIAGNOSIS — R630 Anorexia: Secondary | ICD-10-CM | POA: Diagnosis not present

## 2016-05-18 DIAGNOSIS — I2510 Atherosclerotic heart disease of native coronary artery without angina pectoris: Secondary | ICD-10-CM | POA: Diagnosis not present

## 2016-05-18 DIAGNOSIS — I69364 Other paralytic syndrome following cerebral infarction affecting left non-dominant side: Secondary | ICD-10-CM | POA: Diagnosis not present

## 2016-05-27 DIAGNOSIS — I10 Essential (primary) hypertension: Secondary | ICD-10-CM | POA: Diagnosis not present

## 2016-05-27 DIAGNOSIS — R1312 Dysphagia, oropharyngeal phase: Secondary | ICD-10-CM | POA: Diagnosis not present

## 2016-05-27 DIAGNOSIS — I69364 Other paralytic syndrome following cerebral infarction affecting left non-dominant side: Secondary | ICD-10-CM | POA: Diagnosis not present

## 2016-05-27 DIAGNOSIS — R630 Anorexia: Secondary | ICD-10-CM | POA: Diagnosis not present

## 2016-05-27 DIAGNOSIS — I2510 Atherosclerotic heart disease of native coronary artery without angina pectoris: Secondary | ICD-10-CM | POA: Diagnosis not present

## 2016-05-27 DIAGNOSIS — I69391 Dysphagia following cerebral infarction: Secondary | ICD-10-CM | POA: Diagnosis not present

## 2016-06-02 DIAGNOSIS — I2510 Atherosclerotic heart disease of native coronary artery without angina pectoris: Secondary | ICD-10-CM | POA: Diagnosis not present

## 2016-06-02 DIAGNOSIS — R1312 Dysphagia, oropharyngeal phase: Secondary | ICD-10-CM | POA: Diagnosis not present

## 2016-06-02 DIAGNOSIS — I69391 Dysphagia following cerebral infarction: Secondary | ICD-10-CM | POA: Diagnosis not present

## 2016-06-02 DIAGNOSIS — I69364 Other paralytic syndrome following cerebral infarction affecting left non-dominant side: Secondary | ICD-10-CM | POA: Diagnosis not present

## 2016-06-02 DIAGNOSIS — I10 Essential (primary) hypertension: Secondary | ICD-10-CM | POA: Diagnosis not present

## 2016-06-02 DIAGNOSIS — R630 Anorexia: Secondary | ICD-10-CM | POA: Diagnosis not present

## 2016-06-08 DIAGNOSIS — I10 Essential (primary) hypertension: Secondary | ICD-10-CM | POA: Diagnosis not present

## 2016-06-08 DIAGNOSIS — Z993 Dependence on wheelchair: Secondary | ICD-10-CM | POA: Diagnosis not present

## 2016-06-08 DIAGNOSIS — I2510 Atherosclerotic heart disease of native coronary artery without angina pectoris: Secondary | ICD-10-CM | POA: Diagnosis not present

## 2016-06-08 DIAGNOSIS — J9610 Chronic respiratory failure, unspecified whether with hypoxia or hypercapnia: Secondary | ICD-10-CM | POA: Diagnosis not present

## 2016-06-09 DIAGNOSIS — I69364 Other paralytic syndrome following cerebral infarction affecting left non-dominant side: Secondary | ICD-10-CM | POA: Diagnosis not present

## 2016-06-09 DIAGNOSIS — I10 Essential (primary) hypertension: Secondary | ICD-10-CM | POA: Diagnosis not present

## 2016-06-09 DIAGNOSIS — I69391 Dysphagia following cerebral infarction: Secondary | ICD-10-CM | POA: Diagnosis not present

## 2016-06-09 DIAGNOSIS — R630 Anorexia: Secondary | ICD-10-CM | POA: Diagnosis not present

## 2016-06-09 DIAGNOSIS — I2510 Atherosclerotic heart disease of native coronary artery without angina pectoris: Secondary | ICD-10-CM | POA: Diagnosis not present

## 2016-06-09 DIAGNOSIS — R1312 Dysphagia, oropharyngeal phase: Secondary | ICD-10-CM | POA: Diagnosis not present

## 2016-06-29 DIAGNOSIS — I69391 Dysphagia following cerebral infarction: Secondary | ICD-10-CM | POA: Diagnosis not present

## 2016-06-29 DIAGNOSIS — R1312 Dysphagia, oropharyngeal phase: Secondary | ICD-10-CM | POA: Diagnosis not present

## 2016-06-29 DIAGNOSIS — I69364 Other paralytic syndrome following cerebral infarction affecting left non-dominant side: Secondary | ICD-10-CM | POA: Diagnosis not present

## 2016-06-29 DIAGNOSIS — I2510 Atherosclerotic heart disease of native coronary artery without angina pectoris: Secondary | ICD-10-CM | POA: Diagnosis not present

## 2016-06-29 DIAGNOSIS — I10 Essential (primary) hypertension: Secondary | ICD-10-CM | POA: Diagnosis not present

## 2016-06-29 DIAGNOSIS — R630 Anorexia: Secondary | ICD-10-CM | POA: Diagnosis not present

## 2016-07-05 DIAGNOSIS — R1312 Dysphagia, oropharyngeal phase: Secondary | ICD-10-CM | POA: Diagnosis not present

## 2016-07-05 DIAGNOSIS — R630 Anorexia: Secondary | ICD-10-CM | POA: Diagnosis not present

## 2016-07-05 DIAGNOSIS — I69391 Dysphagia following cerebral infarction: Secondary | ICD-10-CM | POA: Diagnosis not present

## 2016-07-05 DIAGNOSIS — I10 Essential (primary) hypertension: Secondary | ICD-10-CM | POA: Diagnosis not present

## 2016-07-05 DIAGNOSIS — I69364 Other paralytic syndrome following cerebral infarction affecting left non-dominant side: Secondary | ICD-10-CM | POA: Diagnosis not present

## 2016-07-05 DIAGNOSIS — I2510 Atherosclerotic heart disease of native coronary artery without angina pectoris: Secondary | ICD-10-CM | POA: Diagnosis not present

## 2016-07-07 DIAGNOSIS — I2510 Atherosclerotic heart disease of native coronary artery without angina pectoris: Secondary | ICD-10-CM | POA: Diagnosis not present

## 2016-07-07 DIAGNOSIS — I69364 Other paralytic syndrome following cerebral infarction affecting left non-dominant side: Secondary | ICD-10-CM | POA: Diagnosis not present

## 2016-07-07 DIAGNOSIS — R630 Anorexia: Secondary | ICD-10-CM | POA: Diagnosis not present

## 2016-07-07 DIAGNOSIS — I69391 Dysphagia following cerebral infarction: Secondary | ICD-10-CM | POA: Diagnosis not present

## 2016-07-07 DIAGNOSIS — I10 Essential (primary) hypertension: Secondary | ICD-10-CM | POA: Diagnosis not present

## 2016-07-07 DIAGNOSIS — R1312 Dysphagia, oropharyngeal phase: Secondary | ICD-10-CM | POA: Diagnosis not present

## 2016-07-15 DIAGNOSIS — I2510 Atherosclerotic heart disease of native coronary artery without angina pectoris: Secondary | ICD-10-CM | POA: Diagnosis not present

## 2016-07-15 DIAGNOSIS — I69391 Dysphagia following cerebral infarction: Secondary | ICD-10-CM | POA: Diagnosis not present

## 2016-07-15 DIAGNOSIS — R630 Anorexia: Secondary | ICD-10-CM | POA: Diagnosis not present

## 2016-07-15 DIAGNOSIS — I10 Essential (primary) hypertension: Secondary | ICD-10-CM | POA: Diagnosis not present

## 2016-07-15 DIAGNOSIS — R1312 Dysphagia, oropharyngeal phase: Secondary | ICD-10-CM | POA: Diagnosis not present

## 2016-07-15 DIAGNOSIS — I69364 Other paralytic syndrome following cerebral infarction affecting left non-dominant side: Secondary | ICD-10-CM | POA: Diagnosis not present

## 2016-07-20 DIAGNOSIS — R630 Anorexia: Secondary | ICD-10-CM | POA: Diagnosis not present

## 2016-07-20 DIAGNOSIS — I69364 Other paralytic syndrome following cerebral infarction affecting left non-dominant side: Secondary | ICD-10-CM | POA: Diagnosis not present

## 2016-07-20 DIAGNOSIS — I2510 Atherosclerotic heart disease of native coronary artery without angina pectoris: Secondary | ICD-10-CM | POA: Diagnosis not present

## 2016-07-20 DIAGNOSIS — I10 Essential (primary) hypertension: Secondary | ICD-10-CM | POA: Diagnosis not present

## 2016-07-20 DIAGNOSIS — I69391 Dysphagia following cerebral infarction: Secondary | ICD-10-CM | POA: Diagnosis not present

## 2016-07-20 DIAGNOSIS — R1312 Dysphagia, oropharyngeal phase: Secondary | ICD-10-CM | POA: Diagnosis not present

## 2016-07-28 DIAGNOSIS — I2510 Atherosclerotic heart disease of native coronary artery without angina pectoris: Secondary | ICD-10-CM | POA: Diagnosis not present

## 2016-07-28 DIAGNOSIS — I10 Essential (primary) hypertension: Secondary | ICD-10-CM | POA: Diagnosis not present

## 2016-07-28 DIAGNOSIS — R630 Anorexia: Secondary | ICD-10-CM | POA: Diagnosis not present

## 2016-07-28 DIAGNOSIS — I69364 Other paralytic syndrome following cerebral infarction affecting left non-dominant side: Secondary | ICD-10-CM | POA: Diagnosis not present

## 2016-07-28 DIAGNOSIS — I69391 Dysphagia following cerebral infarction: Secondary | ICD-10-CM | POA: Diagnosis not present

## 2016-07-28 DIAGNOSIS — R1312 Dysphagia, oropharyngeal phase: Secondary | ICD-10-CM | POA: Diagnosis not present

## 2016-08-09 DIAGNOSIS — Z993 Dependence on wheelchair: Secondary | ICD-10-CM | POA: Diagnosis not present

## 2016-08-09 DIAGNOSIS — J9610 Chronic respiratory failure, unspecified whether with hypoxia or hypercapnia: Secondary | ICD-10-CM | POA: Diagnosis not present

## 2016-08-09 DIAGNOSIS — I10 Essential (primary) hypertension: Secondary | ICD-10-CM | POA: Diagnosis not present

## 2016-08-09 DIAGNOSIS — I2510 Atherosclerotic heart disease of native coronary artery without angina pectoris: Secondary | ICD-10-CM | POA: Diagnosis not present

## 2016-08-12 DIAGNOSIS — R630 Anorexia: Secondary | ICD-10-CM | POA: Diagnosis not present

## 2016-08-12 DIAGNOSIS — I69364 Other paralytic syndrome following cerebral infarction affecting left non-dominant side: Secondary | ICD-10-CM | POA: Diagnosis not present

## 2016-08-12 DIAGNOSIS — I69391 Dysphagia following cerebral infarction: Secondary | ICD-10-CM | POA: Diagnosis not present

## 2016-08-12 DIAGNOSIS — I2510 Atherosclerotic heart disease of native coronary artery without angina pectoris: Secondary | ICD-10-CM | POA: Diagnosis not present

## 2016-08-12 DIAGNOSIS — R1312 Dysphagia, oropharyngeal phase: Secondary | ICD-10-CM | POA: Diagnosis not present

## 2016-08-12 DIAGNOSIS — I10 Essential (primary) hypertension: Secondary | ICD-10-CM | POA: Diagnosis not present

## 2016-08-18 DIAGNOSIS — Z993 Dependence on wheelchair: Secondary | ICD-10-CM | POA: Diagnosis not present

## 2016-08-18 DIAGNOSIS — R1312 Dysphagia, oropharyngeal phase: Secondary | ICD-10-CM | POA: Diagnosis not present

## 2016-08-18 DIAGNOSIS — G8100 Flaccid hemiplegia affecting unspecified side: Secondary | ICD-10-CM | POA: Diagnosis not present

## 2016-08-24 DIAGNOSIS — I69364 Other paralytic syndrome following cerebral infarction affecting left non-dominant side: Secondary | ICD-10-CM | POA: Diagnosis not present

## 2016-08-24 DIAGNOSIS — I2510 Atherosclerotic heart disease of native coronary artery without angina pectoris: Secondary | ICD-10-CM | POA: Diagnosis not present

## 2016-08-24 DIAGNOSIS — R1312 Dysphagia, oropharyngeal phase: Secondary | ICD-10-CM | POA: Diagnosis not present

## 2016-08-24 DIAGNOSIS — I10 Essential (primary) hypertension: Secondary | ICD-10-CM | POA: Diagnosis not present

## 2016-08-24 DIAGNOSIS — I69391 Dysphagia following cerebral infarction: Secondary | ICD-10-CM | POA: Diagnosis not present

## 2016-08-24 DIAGNOSIS — R630 Anorexia: Secondary | ICD-10-CM | POA: Diagnosis not present

## 2016-09-02 DIAGNOSIS — I10 Essential (primary) hypertension: Secondary | ICD-10-CM | POA: Diagnosis not present

## 2016-09-02 DIAGNOSIS — I69391 Dysphagia following cerebral infarction: Secondary | ICD-10-CM | POA: Diagnosis not present

## 2016-09-02 DIAGNOSIS — I69364 Other paralytic syndrome following cerebral infarction affecting left non-dominant side: Secondary | ICD-10-CM | POA: Diagnosis not present

## 2016-09-02 DIAGNOSIS — R630 Anorexia: Secondary | ICD-10-CM | POA: Diagnosis not present

## 2016-09-02 DIAGNOSIS — I2510 Atherosclerotic heart disease of native coronary artery without angina pectoris: Secondary | ICD-10-CM | POA: Diagnosis not present

## 2016-09-02 DIAGNOSIS — R1312 Dysphagia, oropharyngeal phase: Secondary | ICD-10-CM | POA: Diagnosis not present

## 2016-09-05 DIAGNOSIS — I69364 Other paralytic syndrome following cerebral infarction affecting left non-dominant side: Secondary | ICD-10-CM | POA: Diagnosis not present

## 2016-09-05 DIAGNOSIS — I69391 Dysphagia following cerebral infarction: Secondary | ICD-10-CM | POA: Diagnosis not present

## 2016-09-05 DIAGNOSIS — R1312 Dysphagia, oropharyngeal phase: Secondary | ICD-10-CM | POA: Diagnosis not present

## 2016-09-05 DIAGNOSIS — I2510 Atherosclerotic heart disease of native coronary artery without angina pectoris: Secondary | ICD-10-CM | POA: Diagnosis not present

## 2016-09-05 DIAGNOSIS — I10 Essential (primary) hypertension: Secondary | ICD-10-CM | POA: Diagnosis not present

## 2016-09-05 DIAGNOSIS — R630 Anorexia: Secondary | ICD-10-CM | POA: Diagnosis not present

## 2016-09-09 DIAGNOSIS — R630 Anorexia: Secondary | ICD-10-CM | POA: Diagnosis not present

## 2016-09-09 DIAGNOSIS — R1312 Dysphagia, oropharyngeal phase: Secondary | ICD-10-CM | POA: Diagnosis not present

## 2016-09-09 DIAGNOSIS — I10 Essential (primary) hypertension: Secondary | ICD-10-CM | POA: Diagnosis not present

## 2016-09-09 DIAGNOSIS — I69364 Other paralytic syndrome following cerebral infarction affecting left non-dominant side: Secondary | ICD-10-CM | POA: Diagnosis not present

## 2016-09-09 DIAGNOSIS — I2510 Atherosclerotic heart disease of native coronary artery without angina pectoris: Secondary | ICD-10-CM | POA: Diagnosis not present

## 2016-09-09 DIAGNOSIS — I69391 Dysphagia following cerebral infarction: Secondary | ICD-10-CM | POA: Diagnosis not present

## 2016-09-21 DIAGNOSIS — R1312 Dysphagia, oropharyngeal phase: Secondary | ICD-10-CM | POA: Diagnosis not present

## 2016-09-21 DIAGNOSIS — I2510 Atherosclerotic heart disease of native coronary artery without angina pectoris: Secondary | ICD-10-CM | POA: Diagnosis not present

## 2016-09-21 DIAGNOSIS — I10 Essential (primary) hypertension: Secondary | ICD-10-CM | POA: Diagnosis not present

## 2016-09-21 DIAGNOSIS — I69364 Other paralytic syndrome following cerebral infarction affecting left non-dominant side: Secondary | ICD-10-CM | POA: Diagnosis not present

## 2016-09-21 DIAGNOSIS — R630 Anorexia: Secondary | ICD-10-CM | POA: Diagnosis not present

## 2016-09-21 DIAGNOSIS — I69391 Dysphagia following cerebral infarction: Secondary | ICD-10-CM | POA: Diagnosis not present

## 2016-10-06 DIAGNOSIS — I10 Essential (primary) hypertension: Secondary | ICD-10-CM | POA: Diagnosis not present

## 2016-10-06 DIAGNOSIS — R630 Anorexia: Secondary | ICD-10-CM | POA: Diagnosis not present

## 2016-10-06 DIAGNOSIS — I2510 Atherosclerotic heart disease of native coronary artery without angina pectoris: Secondary | ICD-10-CM | POA: Diagnosis not present

## 2016-10-06 DIAGNOSIS — I69364 Other paralytic syndrome following cerebral infarction affecting left non-dominant side: Secondary | ICD-10-CM | POA: Diagnosis not present

## 2016-10-06 DIAGNOSIS — I69391 Dysphagia following cerebral infarction: Secondary | ICD-10-CM | POA: Diagnosis not present

## 2016-10-06 DIAGNOSIS — R1312 Dysphagia, oropharyngeal phase: Secondary | ICD-10-CM | POA: Diagnosis not present

## 2016-10-11 DIAGNOSIS — R630 Anorexia: Secondary | ICD-10-CM | POA: Diagnosis not present

## 2016-10-11 DIAGNOSIS — I10 Essential (primary) hypertension: Secondary | ICD-10-CM | POA: Diagnosis not present

## 2016-10-11 DIAGNOSIS — R1312 Dysphagia, oropharyngeal phase: Secondary | ICD-10-CM | POA: Diagnosis not present

## 2016-10-11 DIAGNOSIS — I69364 Other paralytic syndrome following cerebral infarction affecting left non-dominant side: Secondary | ICD-10-CM | POA: Diagnosis not present

## 2016-10-11 DIAGNOSIS — I2510 Atherosclerotic heart disease of native coronary artery without angina pectoris: Secondary | ICD-10-CM | POA: Diagnosis not present

## 2016-10-11 DIAGNOSIS — I69391 Dysphagia following cerebral infarction: Secondary | ICD-10-CM | POA: Diagnosis not present

## 2016-10-18 DIAGNOSIS — G8100 Flaccid hemiplegia affecting unspecified side: Secondary | ICD-10-CM | POA: Diagnosis not present

## 2016-10-18 DIAGNOSIS — Z993 Dependence on wheelchair: Secondary | ICD-10-CM | POA: Diagnosis not present

## 2016-10-18 DIAGNOSIS — Z789 Other specified health status: Secondary | ICD-10-CM | POA: Diagnosis not present

## 2016-10-18 DIAGNOSIS — Z9981 Dependence on supplemental oxygen: Secondary | ICD-10-CM | POA: Diagnosis not present

## 2016-10-19 DIAGNOSIS — I69391 Dysphagia following cerebral infarction: Secondary | ICD-10-CM | POA: Diagnosis not present

## 2016-10-19 DIAGNOSIS — R630 Anorexia: Secondary | ICD-10-CM | POA: Diagnosis not present

## 2016-10-19 DIAGNOSIS — I69364 Other paralytic syndrome following cerebral infarction affecting left non-dominant side: Secondary | ICD-10-CM | POA: Diagnosis not present

## 2016-10-19 DIAGNOSIS — I2510 Atherosclerotic heart disease of native coronary artery without angina pectoris: Secondary | ICD-10-CM | POA: Diagnosis not present

## 2016-10-19 DIAGNOSIS — R1312 Dysphagia, oropharyngeal phase: Secondary | ICD-10-CM | POA: Diagnosis not present

## 2016-10-19 DIAGNOSIS — I10 Essential (primary) hypertension: Secondary | ICD-10-CM | POA: Diagnosis not present

## 2016-10-28 DIAGNOSIS — R1312 Dysphagia, oropharyngeal phase: Secondary | ICD-10-CM | POA: Diagnosis not present

## 2016-10-28 DIAGNOSIS — I69364 Other paralytic syndrome following cerebral infarction affecting left non-dominant side: Secondary | ICD-10-CM | POA: Diagnosis not present

## 2016-10-28 DIAGNOSIS — I10 Essential (primary) hypertension: Secondary | ICD-10-CM | POA: Diagnosis not present

## 2016-10-28 DIAGNOSIS — R630 Anorexia: Secondary | ICD-10-CM | POA: Diagnosis not present

## 2016-10-28 DIAGNOSIS — I69391 Dysphagia following cerebral infarction: Secondary | ICD-10-CM | POA: Diagnosis not present

## 2016-10-28 DIAGNOSIS — I2510 Atherosclerotic heart disease of native coronary artery without angina pectoris: Secondary | ICD-10-CM | POA: Diagnosis not present

## 2016-11-15 ENCOUNTER — Other Ambulatory Visit: Payer: Self-pay

## 2016-11-15 DIAGNOSIS — G8100 Flaccid hemiplegia affecting unspecified side: Secondary | ICD-10-CM | POA: Diagnosis not present

## 2016-11-15 DIAGNOSIS — Z139 Encounter for screening, unspecified: Secondary | ICD-10-CM | POA: Diagnosis not present

## 2016-11-15 DIAGNOSIS — Z993 Dependence on wheelchair: Secondary | ICD-10-CM | POA: Diagnosis not present

## 2016-11-15 DIAGNOSIS — I10 Essential (primary) hypertension: Secondary | ICD-10-CM | POA: Diagnosis not present

## 2016-11-24 DIAGNOSIS — I2510 Atherosclerotic heart disease of native coronary artery without angina pectoris: Secondary | ICD-10-CM | POA: Diagnosis not present

## 2016-11-24 DIAGNOSIS — I10 Essential (primary) hypertension: Secondary | ICD-10-CM | POA: Diagnosis not present

## 2016-11-24 DIAGNOSIS — I69364 Other paralytic syndrome following cerebral infarction affecting left non-dominant side: Secondary | ICD-10-CM | POA: Diagnosis not present

## 2016-11-24 DIAGNOSIS — I69391 Dysphagia following cerebral infarction: Secondary | ICD-10-CM | POA: Diagnosis not present

## 2016-11-24 DIAGNOSIS — L89152 Pressure ulcer of sacral region, stage 2: Secondary | ICD-10-CM | POA: Diagnosis not present

## 2016-11-24 DIAGNOSIS — R1312 Dysphagia, oropharyngeal phase: Secondary | ICD-10-CM | POA: Diagnosis not present

## 2016-11-29 DIAGNOSIS — R1312 Dysphagia, oropharyngeal phase: Secondary | ICD-10-CM | POA: Diagnosis not present

## 2016-11-29 DIAGNOSIS — I2510 Atherosclerotic heart disease of native coronary artery without angina pectoris: Secondary | ICD-10-CM | POA: Diagnosis not present

## 2016-11-29 DIAGNOSIS — L89152 Pressure ulcer of sacral region, stage 2: Secondary | ICD-10-CM | POA: Diagnosis not present

## 2016-11-29 DIAGNOSIS — I10 Essential (primary) hypertension: Secondary | ICD-10-CM | POA: Diagnosis not present

## 2016-11-29 DIAGNOSIS — I69364 Other paralytic syndrome following cerebral infarction affecting left non-dominant side: Secondary | ICD-10-CM | POA: Diagnosis not present

## 2016-11-29 DIAGNOSIS — I69391 Dysphagia following cerebral infarction: Secondary | ICD-10-CM | POA: Diagnosis not present

## 2016-12-02 DIAGNOSIS — I69364 Other paralytic syndrome following cerebral infarction affecting left non-dominant side: Secondary | ICD-10-CM | POA: Diagnosis not present

## 2016-12-02 DIAGNOSIS — R1312 Dysphagia, oropharyngeal phase: Secondary | ICD-10-CM | POA: Diagnosis not present

## 2016-12-02 DIAGNOSIS — I10 Essential (primary) hypertension: Secondary | ICD-10-CM | POA: Diagnosis not present

## 2016-12-02 DIAGNOSIS — I69391 Dysphagia following cerebral infarction: Secondary | ICD-10-CM | POA: Diagnosis not present

## 2016-12-02 DIAGNOSIS — L89152 Pressure ulcer of sacral region, stage 2: Secondary | ICD-10-CM | POA: Diagnosis not present

## 2016-12-02 DIAGNOSIS — I2510 Atherosclerotic heart disease of native coronary artery without angina pectoris: Secondary | ICD-10-CM | POA: Diagnosis not present

## 2016-12-08 DIAGNOSIS — I2510 Atherosclerotic heart disease of native coronary artery without angina pectoris: Secondary | ICD-10-CM | POA: Diagnosis not present

## 2016-12-08 DIAGNOSIS — L89152 Pressure ulcer of sacral region, stage 2: Secondary | ICD-10-CM | POA: Diagnosis not present

## 2016-12-08 DIAGNOSIS — R1312 Dysphagia, oropharyngeal phase: Secondary | ICD-10-CM | POA: Diagnosis not present

## 2016-12-08 DIAGNOSIS — I10 Essential (primary) hypertension: Secondary | ICD-10-CM | POA: Diagnosis not present

## 2016-12-08 DIAGNOSIS — I69391 Dysphagia following cerebral infarction: Secondary | ICD-10-CM | POA: Diagnosis not present

## 2016-12-08 DIAGNOSIS — I69364 Other paralytic syndrome following cerebral infarction affecting left non-dominant side: Secondary | ICD-10-CM | POA: Diagnosis not present

## 2016-12-13 DIAGNOSIS — I69364 Other paralytic syndrome following cerebral infarction affecting left non-dominant side: Secondary | ICD-10-CM | POA: Diagnosis not present

## 2016-12-13 DIAGNOSIS — I10 Essential (primary) hypertension: Secondary | ICD-10-CM | POA: Diagnosis not present

## 2016-12-13 DIAGNOSIS — R1312 Dysphagia, oropharyngeal phase: Secondary | ICD-10-CM | POA: Diagnosis not present

## 2016-12-13 DIAGNOSIS — I2510 Atherosclerotic heart disease of native coronary artery without angina pectoris: Secondary | ICD-10-CM | POA: Diagnosis not present

## 2016-12-13 DIAGNOSIS — L89152 Pressure ulcer of sacral region, stage 2: Secondary | ICD-10-CM | POA: Diagnosis not present

## 2016-12-13 DIAGNOSIS — I69391 Dysphagia following cerebral infarction: Secondary | ICD-10-CM | POA: Diagnosis not present

## 2016-12-20 DIAGNOSIS — I2510 Atherosclerotic heart disease of native coronary artery without angina pectoris: Secondary | ICD-10-CM | POA: Diagnosis not present

## 2016-12-20 DIAGNOSIS — L89152 Pressure ulcer of sacral region, stage 2: Secondary | ICD-10-CM | POA: Diagnosis not present

## 2016-12-20 DIAGNOSIS — I10 Essential (primary) hypertension: Secondary | ICD-10-CM | POA: Diagnosis not present

## 2016-12-20 DIAGNOSIS — I69391 Dysphagia following cerebral infarction: Secondary | ICD-10-CM | POA: Diagnosis not present

## 2016-12-20 DIAGNOSIS — R1312 Dysphagia, oropharyngeal phase: Secondary | ICD-10-CM | POA: Diagnosis not present

## 2016-12-20 DIAGNOSIS — I69364 Other paralytic syndrome following cerebral infarction affecting left non-dominant side: Secondary | ICD-10-CM | POA: Diagnosis not present

## 2016-12-27 DIAGNOSIS — I69364 Other paralytic syndrome following cerebral infarction affecting left non-dominant side: Secondary | ICD-10-CM | POA: Diagnosis not present

## 2016-12-27 DIAGNOSIS — I10 Essential (primary) hypertension: Secondary | ICD-10-CM | POA: Diagnosis not present

## 2016-12-27 DIAGNOSIS — I2510 Atherosclerotic heart disease of native coronary artery without angina pectoris: Secondary | ICD-10-CM | POA: Diagnosis not present

## 2016-12-27 DIAGNOSIS — I69391 Dysphagia following cerebral infarction: Secondary | ICD-10-CM | POA: Diagnosis not present

## 2016-12-27 DIAGNOSIS — R1312 Dysphagia, oropharyngeal phase: Secondary | ICD-10-CM | POA: Diagnosis not present

## 2016-12-27 DIAGNOSIS — L89152 Pressure ulcer of sacral region, stage 2: Secondary | ICD-10-CM | POA: Diagnosis not present

## 2017-01-03 DIAGNOSIS — L89152 Pressure ulcer of sacral region, stage 2: Secondary | ICD-10-CM | POA: Diagnosis not present

## 2017-01-03 DIAGNOSIS — I10 Essential (primary) hypertension: Secondary | ICD-10-CM | POA: Diagnosis not present

## 2017-01-03 DIAGNOSIS — I69364 Other paralytic syndrome following cerebral infarction affecting left non-dominant side: Secondary | ICD-10-CM | POA: Diagnosis not present

## 2017-01-03 DIAGNOSIS — I2510 Atherosclerotic heart disease of native coronary artery without angina pectoris: Secondary | ICD-10-CM | POA: Diagnosis not present

## 2017-01-03 DIAGNOSIS — I69391 Dysphagia following cerebral infarction: Secondary | ICD-10-CM | POA: Diagnosis not present

## 2017-01-03 DIAGNOSIS — R1312 Dysphagia, oropharyngeal phase: Secondary | ICD-10-CM | POA: Diagnosis not present

## 2017-01-12 DIAGNOSIS — K921 Melena: Secondary | ICD-10-CM | POA: Diagnosis not present

## 2017-01-12 DIAGNOSIS — I10 Essential (primary) hypertension: Secondary | ICD-10-CM | POA: Diagnosis not present

## 2017-01-12 DIAGNOSIS — M1990 Unspecified osteoarthritis, unspecified site: Secondary | ICD-10-CM | POA: Diagnosis present

## 2017-01-12 DIAGNOSIS — E7800 Pure hypercholesterolemia, unspecified: Secondary | ICD-10-CM | POA: Diagnosis not present

## 2017-01-12 DIAGNOSIS — F418 Other specified anxiety disorders: Secondary | ICD-10-CM | POA: Diagnosis present

## 2017-01-12 DIAGNOSIS — Z7902 Long term (current) use of antithrombotics/antiplatelets: Secondary | ICD-10-CM | POA: Diagnosis not present

## 2017-01-12 DIAGNOSIS — Z23 Encounter for immunization: Secondary | ICD-10-CM | POA: Diagnosis not present

## 2017-01-12 DIAGNOSIS — Z79899 Other long term (current) drug therapy: Secondary | ICD-10-CM | POA: Diagnosis not present

## 2017-01-12 DIAGNOSIS — R404 Transient alteration of awareness: Secondary | ICD-10-CM | POA: Diagnosis not present

## 2017-01-12 DIAGNOSIS — Z4682 Encounter for fitting and adjustment of non-vascular catheter: Secondary | ICD-10-CM | POA: Diagnosis not present

## 2017-01-12 DIAGNOSIS — I639 Cerebral infarction, unspecified: Secondary | ICD-10-CM | POA: Diagnosis not present

## 2017-01-12 DIAGNOSIS — K219 Gastro-esophageal reflux disease without esophagitis: Secondary | ICD-10-CM | POA: Diagnosis not present

## 2017-01-12 DIAGNOSIS — J449 Chronic obstructive pulmonary disease, unspecified: Secondary | ICD-10-CM | POA: Diagnosis present

## 2017-01-12 DIAGNOSIS — I69954 Hemiplegia and hemiparesis following unspecified cerebrovascular disease affecting left non-dominant side: Secondary | ICD-10-CM | POA: Diagnosis not present

## 2017-01-12 DIAGNOSIS — K922 Gastrointestinal hemorrhage, unspecified: Secondary | ICD-10-CM | POA: Diagnosis not present

## 2017-01-12 DIAGNOSIS — R531 Weakness: Secondary | ICD-10-CM | POA: Diagnosis not present

## 2017-01-12 DIAGNOSIS — E870 Hyperosmolality and hypernatremia: Secondary | ICD-10-CM | POA: Diagnosis present

## 2017-01-12 DIAGNOSIS — K5641 Fecal impaction: Secondary | ICD-10-CM | POA: Diagnosis not present

## 2017-01-12 DIAGNOSIS — K633 Ulcer of intestine: Secondary | ICD-10-CM | POA: Diagnosis not present

## 2017-01-12 DIAGNOSIS — Z7982 Long term (current) use of aspirin: Secondary | ICD-10-CM | POA: Diagnosis not present

## 2017-01-12 DIAGNOSIS — Z538 Procedure and treatment not carried out for other reasons: Secondary | ICD-10-CM | POA: Diagnosis not present

## 2017-01-12 DIAGNOSIS — K635 Polyp of colon: Secondary | ICD-10-CM | POA: Diagnosis not present

## 2017-01-12 DIAGNOSIS — Z87891 Personal history of nicotine dependence: Secondary | ICD-10-CM | POA: Diagnosis not present

## 2017-01-12 DIAGNOSIS — E875 Hyperkalemia: Secondary | ICD-10-CM | POA: Diagnosis present

## 2017-01-12 DIAGNOSIS — R109 Unspecified abdominal pain: Secondary | ICD-10-CM | POA: Diagnosis not present

## 2017-01-12 DIAGNOSIS — I6930 Unspecified sequelae of cerebral infarction: Secondary | ICD-10-CM | POA: Diagnosis not present

## 2017-01-12 DIAGNOSIS — R740 Nonspecific elevation of levels of transaminase and lactic acid dehydrogenase [LDH]: Secondary | ICD-10-CM | POA: Diagnosis present

## 2017-01-12 DIAGNOSIS — I69320 Aphasia following cerebral infarction: Secondary | ICD-10-CM | POA: Diagnosis not present

## 2017-01-12 DIAGNOSIS — K5649 Other impaction of intestine: Secondary | ICD-10-CM | POA: Diagnosis present

## 2017-01-12 DIAGNOSIS — K626 Ulcer of anus and rectum: Secondary | ICD-10-CM | POA: Diagnosis not present

## 2017-01-12 DIAGNOSIS — K5730 Diverticulosis of large intestine without perforation or abscess without bleeding: Secondary | ICD-10-CM | POA: Diagnosis not present

## 2017-01-12 DIAGNOSIS — D124 Benign neoplasm of descending colon: Secondary | ICD-10-CM | POA: Diagnosis not present

## 2017-01-12 DIAGNOSIS — Z931 Gastrostomy status: Secondary | ICD-10-CM | POA: Diagnosis not present

## 2017-01-12 DIAGNOSIS — D62 Acute posthemorrhagic anemia: Secondary | ICD-10-CM | POA: Diagnosis not present

## 2017-01-12 DIAGNOSIS — K5731 Diverticulosis of large intestine without perforation or abscess with bleeding: Secondary | ICD-10-CM | POA: Diagnosis present

## 2017-01-12 DIAGNOSIS — Z87440 Personal history of urinary (tract) infections: Secondary | ICD-10-CM | POA: Diagnosis not present

## 2017-01-14 DIAGNOSIS — Z538 Procedure and treatment not carried out for other reasons: Secondary | ICD-10-CM | POA: Diagnosis not present

## 2017-01-14 DIAGNOSIS — K922 Gastrointestinal hemorrhage, unspecified: Secondary | ICD-10-CM | POA: Diagnosis not present

## 2017-01-16 DIAGNOSIS — K5730 Diverticulosis of large intestine without perforation or abscess without bleeding: Secondary | ICD-10-CM | POA: Diagnosis not present

## 2017-01-16 DIAGNOSIS — K633 Ulcer of intestine: Secondary | ICD-10-CM | POA: Diagnosis not present

## 2017-01-16 DIAGNOSIS — K635 Polyp of colon: Secondary | ICD-10-CM | POA: Diagnosis not present

## 2017-01-18 DIAGNOSIS — L89152 Pressure ulcer of sacral region, stage 2: Secondary | ICD-10-CM | POA: Diagnosis not present

## 2017-01-18 DIAGNOSIS — I69391 Dysphagia following cerebral infarction: Secondary | ICD-10-CM | POA: Diagnosis not present

## 2017-01-18 DIAGNOSIS — R1312 Dysphagia, oropharyngeal phase: Secondary | ICD-10-CM | POA: Diagnosis not present

## 2017-01-18 DIAGNOSIS — I69364 Other paralytic syndrome following cerebral infarction affecting left non-dominant side: Secondary | ICD-10-CM | POA: Diagnosis not present

## 2017-01-18 DIAGNOSIS — I10 Essential (primary) hypertension: Secondary | ICD-10-CM | POA: Diagnosis not present

## 2017-01-18 DIAGNOSIS — I2510 Atherosclerotic heart disease of native coronary artery without angina pectoris: Secondary | ICD-10-CM | POA: Diagnosis not present

## 2017-01-23 DIAGNOSIS — I69391 Dysphagia following cerebral infarction: Secondary | ICD-10-CM | POA: Diagnosis not present

## 2017-01-23 DIAGNOSIS — Z9189 Other specified personal risk factors, not elsewhere classified: Secondary | ICD-10-CM | POA: Diagnosis not present

## 2017-01-23 DIAGNOSIS — R0602 Shortness of breath: Secondary | ICD-10-CM | POA: Diagnosis not present

## 2017-01-23 DIAGNOSIS — I10 Essential (primary) hypertension: Secondary | ICD-10-CM | POA: Diagnosis not present

## 2017-01-23 DIAGNOSIS — L89152 Pressure ulcer of sacral region, stage 2: Secondary | ICD-10-CM | POA: Diagnosis not present

## 2017-01-23 DIAGNOSIS — D62 Acute posthemorrhagic anemia: Secondary | ICD-10-CM | POA: Diagnosis not present

## 2017-01-23 DIAGNOSIS — F339 Major depressive disorder, recurrent, unspecified: Secondary | ICD-10-CM | POA: Diagnosis not present

## 2017-01-23 DIAGNOSIS — Z1331 Encounter for screening for depression: Secondary | ICD-10-CM | POA: Diagnosis not present

## 2017-01-23 DIAGNOSIS — K625 Hemorrhage of anus and rectum: Secondary | ICD-10-CM | POA: Diagnosis not present

## 2017-01-23 DIAGNOSIS — K51811 Other ulcerative colitis with rectal bleeding: Secondary | ICD-10-CM | POA: Diagnosis not present

## 2017-01-23 DIAGNOSIS — I69320 Aphasia following cerebral infarction: Secondary | ICD-10-CM | POA: Diagnosis not present

## 2017-01-23 DIAGNOSIS — Z139 Encounter for screening, unspecified: Secondary | ICD-10-CM | POA: Diagnosis not present

## 2017-01-23 DIAGNOSIS — R1312 Dysphagia, oropharyngeal phase: Secondary | ICD-10-CM | POA: Diagnosis not present

## 2017-01-23 DIAGNOSIS — Z79899 Other long term (current) drug therapy: Secondary | ICD-10-CM | POA: Diagnosis not present

## 2017-01-23 DIAGNOSIS — R531 Weakness: Secondary | ICD-10-CM | POA: Diagnosis not present

## 2017-01-24 DIAGNOSIS — D62 Acute posthemorrhagic anemia: Secondary | ICD-10-CM | POA: Diagnosis not present

## 2017-01-25 DIAGNOSIS — K51811 Other ulcerative colitis with rectal bleeding: Secondary | ICD-10-CM | POA: Diagnosis not present

## 2017-01-25 DIAGNOSIS — L89152 Pressure ulcer of sacral region, stage 2: Secondary | ICD-10-CM | POA: Diagnosis not present

## 2017-01-25 DIAGNOSIS — I69320 Aphasia following cerebral infarction: Secondary | ICD-10-CM | POA: Diagnosis not present

## 2017-01-25 DIAGNOSIS — R1312 Dysphagia, oropharyngeal phase: Secondary | ICD-10-CM | POA: Diagnosis not present

## 2017-01-25 DIAGNOSIS — I10 Essential (primary) hypertension: Secondary | ICD-10-CM | POA: Diagnosis not present

## 2017-01-25 DIAGNOSIS — I69391 Dysphagia following cerebral infarction: Secondary | ICD-10-CM | POA: Diagnosis not present

## 2017-01-26 DIAGNOSIS — I69320 Aphasia following cerebral infarction: Secondary | ICD-10-CM | POA: Diagnosis not present

## 2017-01-26 DIAGNOSIS — R1312 Dysphagia, oropharyngeal phase: Secondary | ICD-10-CM | POA: Diagnosis not present

## 2017-01-26 DIAGNOSIS — K51811 Other ulcerative colitis with rectal bleeding: Secondary | ICD-10-CM | POA: Diagnosis not present

## 2017-01-26 DIAGNOSIS — L89152 Pressure ulcer of sacral region, stage 2: Secondary | ICD-10-CM | POA: Diagnosis not present

## 2017-01-26 DIAGNOSIS — I69391 Dysphagia following cerebral infarction: Secondary | ICD-10-CM | POA: Diagnosis not present

## 2017-01-26 DIAGNOSIS — I10 Essential (primary) hypertension: Secondary | ICD-10-CM | POA: Diagnosis not present

## 2017-01-30 DIAGNOSIS — L89152 Pressure ulcer of sacral region, stage 2: Secondary | ICD-10-CM | POA: Diagnosis not present

## 2017-01-30 DIAGNOSIS — I69391 Dysphagia following cerebral infarction: Secondary | ICD-10-CM | POA: Diagnosis not present

## 2017-01-30 DIAGNOSIS — K51811 Other ulcerative colitis with rectal bleeding: Secondary | ICD-10-CM | POA: Diagnosis not present

## 2017-01-30 DIAGNOSIS — R1312 Dysphagia, oropharyngeal phase: Secondary | ICD-10-CM | POA: Diagnosis not present

## 2017-01-30 DIAGNOSIS — I10 Essential (primary) hypertension: Secondary | ICD-10-CM | POA: Diagnosis not present

## 2017-01-30 DIAGNOSIS — I69320 Aphasia following cerebral infarction: Secondary | ICD-10-CM | POA: Diagnosis not present

## 2017-01-31 DIAGNOSIS — K51811 Other ulcerative colitis with rectal bleeding: Secondary | ICD-10-CM | POA: Diagnosis not present

## 2017-01-31 DIAGNOSIS — I69320 Aphasia following cerebral infarction: Secondary | ICD-10-CM | POA: Diagnosis not present

## 2017-01-31 DIAGNOSIS — R1312 Dysphagia, oropharyngeal phase: Secondary | ICD-10-CM | POA: Diagnosis not present

## 2017-01-31 DIAGNOSIS — I10 Essential (primary) hypertension: Secondary | ICD-10-CM | POA: Diagnosis not present

## 2017-01-31 DIAGNOSIS — I69391 Dysphagia following cerebral infarction: Secondary | ICD-10-CM | POA: Diagnosis not present

## 2017-01-31 DIAGNOSIS — L89152 Pressure ulcer of sacral region, stage 2: Secondary | ICD-10-CM | POA: Diagnosis not present

## 2017-02-03 DIAGNOSIS — I10 Essential (primary) hypertension: Secondary | ICD-10-CM | POA: Diagnosis not present

## 2017-02-03 DIAGNOSIS — I69320 Aphasia following cerebral infarction: Secondary | ICD-10-CM | POA: Diagnosis not present

## 2017-02-03 DIAGNOSIS — K51811 Other ulcerative colitis with rectal bleeding: Secondary | ICD-10-CM | POA: Diagnosis not present

## 2017-02-03 DIAGNOSIS — L89152 Pressure ulcer of sacral region, stage 2: Secondary | ICD-10-CM | POA: Diagnosis not present

## 2017-02-03 DIAGNOSIS — I69391 Dysphagia following cerebral infarction: Secondary | ICD-10-CM | POA: Diagnosis not present

## 2017-02-03 DIAGNOSIS — R1312 Dysphagia, oropharyngeal phase: Secondary | ICD-10-CM | POA: Diagnosis not present

## 2017-02-06 DIAGNOSIS — K51811 Other ulcerative colitis with rectal bleeding: Secondary | ICD-10-CM | POA: Diagnosis not present

## 2017-02-06 DIAGNOSIS — I69391 Dysphagia following cerebral infarction: Secondary | ICD-10-CM | POA: Diagnosis not present

## 2017-02-06 DIAGNOSIS — I69320 Aphasia following cerebral infarction: Secondary | ICD-10-CM | POA: Diagnosis not present

## 2017-02-06 DIAGNOSIS — I10 Essential (primary) hypertension: Secondary | ICD-10-CM | POA: Diagnosis not present

## 2017-02-06 DIAGNOSIS — R1312 Dysphagia, oropharyngeal phase: Secondary | ICD-10-CM | POA: Diagnosis not present

## 2017-02-06 DIAGNOSIS — L89152 Pressure ulcer of sacral region, stage 2: Secondary | ICD-10-CM | POA: Diagnosis not present

## 2017-02-08 DIAGNOSIS — I69391 Dysphagia following cerebral infarction: Secondary | ICD-10-CM | POA: Diagnosis not present

## 2017-02-08 DIAGNOSIS — L89152 Pressure ulcer of sacral region, stage 2: Secondary | ICD-10-CM | POA: Diagnosis not present

## 2017-02-08 DIAGNOSIS — I69320 Aphasia following cerebral infarction: Secondary | ICD-10-CM | POA: Diagnosis not present

## 2017-02-08 DIAGNOSIS — I10 Essential (primary) hypertension: Secondary | ICD-10-CM | POA: Diagnosis not present

## 2017-02-08 DIAGNOSIS — K51811 Other ulcerative colitis with rectal bleeding: Secondary | ICD-10-CM | POA: Diagnosis not present

## 2017-02-08 DIAGNOSIS — R1312 Dysphagia, oropharyngeal phase: Secondary | ICD-10-CM | POA: Diagnosis not present

## 2017-02-10 DIAGNOSIS — L89152 Pressure ulcer of sacral region, stage 2: Secondary | ICD-10-CM | POA: Diagnosis not present

## 2017-02-10 DIAGNOSIS — R1312 Dysphagia, oropharyngeal phase: Secondary | ICD-10-CM | POA: Diagnosis not present

## 2017-02-10 DIAGNOSIS — K51811 Other ulcerative colitis with rectal bleeding: Secondary | ICD-10-CM | POA: Diagnosis not present

## 2017-02-10 DIAGNOSIS — I69320 Aphasia following cerebral infarction: Secondary | ICD-10-CM | POA: Diagnosis not present

## 2017-02-10 DIAGNOSIS — I69391 Dysphagia following cerebral infarction: Secondary | ICD-10-CM | POA: Diagnosis not present

## 2017-02-10 DIAGNOSIS — I10 Essential (primary) hypertension: Secondary | ICD-10-CM | POA: Diagnosis not present

## 2017-02-14 DIAGNOSIS — R1312 Dysphagia, oropharyngeal phase: Secondary | ICD-10-CM | POA: Diagnosis not present

## 2017-02-14 DIAGNOSIS — I10 Essential (primary) hypertension: Secondary | ICD-10-CM | POA: Diagnosis not present

## 2017-02-14 DIAGNOSIS — I69320 Aphasia following cerebral infarction: Secondary | ICD-10-CM | POA: Diagnosis not present

## 2017-02-14 DIAGNOSIS — L89152 Pressure ulcer of sacral region, stage 2: Secondary | ICD-10-CM | POA: Diagnosis not present

## 2017-02-14 DIAGNOSIS — I69391 Dysphagia following cerebral infarction: Secondary | ICD-10-CM | POA: Diagnosis not present

## 2017-02-14 DIAGNOSIS — K51811 Other ulcerative colitis with rectal bleeding: Secondary | ICD-10-CM | POA: Diagnosis not present

## 2017-02-15 DIAGNOSIS — I69320 Aphasia following cerebral infarction: Secondary | ICD-10-CM | POA: Diagnosis not present

## 2017-02-15 DIAGNOSIS — L89152 Pressure ulcer of sacral region, stage 2: Secondary | ICD-10-CM | POA: Diagnosis not present

## 2017-02-15 DIAGNOSIS — I10 Essential (primary) hypertension: Secondary | ICD-10-CM | POA: Diagnosis not present

## 2017-02-15 DIAGNOSIS — K51811 Other ulcerative colitis with rectal bleeding: Secondary | ICD-10-CM | POA: Diagnosis not present

## 2017-02-15 DIAGNOSIS — I69391 Dysphagia following cerebral infarction: Secondary | ICD-10-CM | POA: Diagnosis not present

## 2017-02-15 DIAGNOSIS — R1312 Dysphagia, oropharyngeal phase: Secondary | ICD-10-CM | POA: Diagnosis not present

## 2017-02-16 DIAGNOSIS — R1312 Dysphagia, oropharyngeal phase: Secondary | ICD-10-CM | POA: Diagnosis not present

## 2017-02-16 DIAGNOSIS — I10 Essential (primary) hypertension: Secondary | ICD-10-CM | POA: Diagnosis not present

## 2017-02-16 DIAGNOSIS — L89152 Pressure ulcer of sacral region, stage 2: Secondary | ICD-10-CM | POA: Diagnosis not present

## 2017-02-16 DIAGNOSIS — I69320 Aphasia following cerebral infarction: Secondary | ICD-10-CM | POA: Diagnosis not present

## 2017-02-16 DIAGNOSIS — I69391 Dysphagia following cerebral infarction: Secondary | ICD-10-CM | POA: Diagnosis not present

## 2017-02-16 DIAGNOSIS — K51811 Other ulcerative colitis with rectal bleeding: Secondary | ICD-10-CM | POA: Diagnosis not present

## 2017-02-21 DIAGNOSIS — I69391 Dysphagia following cerebral infarction: Secondary | ICD-10-CM | POA: Diagnosis not present

## 2017-02-21 DIAGNOSIS — R1312 Dysphagia, oropharyngeal phase: Secondary | ICD-10-CM | POA: Diagnosis not present

## 2017-02-21 DIAGNOSIS — L89152 Pressure ulcer of sacral region, stage 2: Secondary | ICD-10-CM | POA: Diagnosis not present

## 2017-02-21 DIAGNOSIS — K51811 Other ulcerative colitis with rectal bleeding: Secondary | ICD-10-CM | POA: Diagnosis not present

## 2017-02-21 DIAGNOSIS — I69320 Aphasia following cerebral infarction: Secondary | ICD-10-CM | POA: Diagnosis not present

## 2017-02-21 DIAGNOSIS — I10 Essential (primary) hypertension: Secondary | ICD-10-CM | POA: Diagnosis not present

## 2017-02-23 DIAGNOSIS — L89152 Pressure ulcer of sacral region, stage 2: Secondary | ICD-10-CM | POA: Diagnosis not present

## 2017-02-23 DIAGNOSIS — K51811 Other ulcerative colitis with rectal bleeding: Secondary | ICD-10-CM | POA: Diagnosis not present

## 2017-02-23 DIAGNOSIS — I69320 Aphasia following cerebral infarction: Secondary | ICD-10-CM | POA: Diagnosis not present

## 2017-02-23 DIAGNOSIS — I69391 Dysphagia following cerebral infarction: Secondary | ICD-10-CM | POA: Diagnosis not present

## 2017-02-23 DIAGNOSIS — R1312 Dysphagia, oropharyngeal phase: Secondary | ICD-10-CM | POA: Diagnosis not present

## 2017-02-23 DIAGNOSIS — I10 Essential (primary) hypertension: Secondary | ICD-10-CM | POA: Diagnosis not present

## 2017-02-24 DIAGNOSIS — K51811 Other ulcerative colitis with rectal bleeding: Secondary | ICD-10-CM | POA: Diagnosis not present

## 2017-02-24 DIAGNOSIS — L89152 Pressure ulcer of sacral region, stage 2: Secondary | ICD-10-CM | POA: Diagnosis not present

## 2017-02-24 DIAGNOSIS — I10 Essential (primary) hypertension: Secondary | ICD-10-CM | POA: Diagnosis not present

## 2017-02-24 DIAGNOSIS — I69320 Aphasia following cerebral infarction: Secondary | ICD-10-CM | POA: Diagnosis not present

## 2017-02-24 DIAGNOSIS — I69391 Dysphagia following cerebral infarction: Secondary | ICD-10-CM | POA: Diagnosis not present

## 2017-02-24 DIAGNOSIS — R1312 Dysphagia, oropharyngeal phase: Secondary | ICD-10-CM | POA: Diagnosis not present

## 2017-03-03 DIAGNOSIS — K51811 Other ulcerative colitis with rectal bleeding: Secondary | ICD-10-CM | POA: Diagnosis not present

## 2017-03-03 DIAGNOSIS — I10 Essential (primary) hypertension: Secondary | ICD-10-CM | POA: Diagnosis not present

## 2017-03-03 DIAGNOSIS — R1312 Dysphagia, oropharyngeal phase: Secondary | ICD-10-CM | POA: Diagnosis not present

## 2017-03-03 DIAGNOSIS — I69320 Aphasia following cerebral infarction: Secondary | ICD-10-CM | POA: Diagnosis not present

## 2017-03-03 DIAGNOSIS — I69391 Dysphagia following cerebral infarction: Secondary | ICD-10-CM | POA: Diagnosis not present

## 2017-03-03 DIAGNOSIS — L89152 Pressure ulcer of sacral region, stage 2: Secondary | ICD-10-CM | POA: Diagnosis not present

## 2017-03-07 DIAGNOSIS — I10 Essential (primary) hypertension: Secondary | ICD-10-CM | POA: Diagnosis not present

## 2017-03-07 DIAGNOSIS — I69391 Dysphagia following cerebral infarction: Secondary | ICD-10-CM | POA: Diagnosis not present

## 2017-03-07 DIAGNOSIS — K51811 Other ulcerative colitis with rectal bleeding: Secondary | ICD-10-CM | POA: Diagnosis not present

## 2017-03-07 DIAGNOSIS — R1312 Dysphagia, oropharyngeal phase: Secondary | ICD-10-CM | POA: Diagnosis not present

## 2017-03-07 DIAGNOSIS — I69320 Aphasia following cerebral infarction: Secondary | ICD-10-CM | POA: Diagnosis not present

## 2017-03-07 DIAGNOSIS — L89152 Pressure ulcer of sacral region, stage 2: Secondary | ICD-10-CM | POA: Diagnosis not present

## 2017-03-14 DIAGNOSIS — K51811 Other ulcerative colitis with rectal bleeding: Secondary | ICD-10-CM | POA: Diagnosis not present

## 2017-03-14 DIAGNOSIS — I10 Essential (primary) hypertension: Secondary | ICD-10-CM | POA: Diagnosis not present

## 2017-03-14 DIAGNOSIS — L89152 Pressure ulcer of sacral region, stage 2: Secondary | ICD-10-CM | POA: Diagnosis not present

## 2017-03-14 DIAGNOSIS — R1312 Dysphagia, oropharyngeal phase: Secondary | ICD-10-CM | POA: Diagnosis not present

## 2017-03-14 DIAGNOSIS — I69320 Aphasia following cerebral infarction: Secondary | ICD-10-CM | POA: Diagnosis not present

## 2017-03-14 DIAGNOSIS — I69391 Dysphagia following cerebral infarction: Secondary | ICD-10-CM | POA: Diagnosis not present

## 2017-05-16 DIAGNOSIS — G8100 Flaccid hemiplegia affecting unspecified side: Secondary | ICD-10-CM | POA: Diagnosis not present

## 2017-05-16 DIAGNOSIS — Z993 Dependence on wheelchair: Secondary | ICD-10-CM | POA: Diagnosis not present

## 2017-05-16 DIAGNOSIS — Z789 Other specified health status: Secondary | ICD-10-CM | POA: Diagnosis not present

## 2017-05-19 DIAGNOSIS — Z6821 Body mass index (BMI) 21.0-21.9, adult: Secondary | ICD-10-CM | POA: Diagnosis not present

## 2017-05-19 DIAGNOSIS — B353 Tinea pedis: Secondary | ICD-10-CM | POA: Diagnosis not present

## 2017-05-19 DIAGNOSIS — H6122 Impacted cerumen, left ear: Secondary | ICD-10-CM | POA: Diagnosis not present

## 2017-06-01 DIAGNOSIS — G8100 Flaccid hemiplegia affecting unspecified side: Secondary | ICD-10-CM | POA: Diagnosis not present

## 2017-06-01 DIAGNOSIS — Z789 Other specified health status: Secondary | ICD-10-CM | POA: Diagnosis not present

## 2017-08-07 DIAGNOSIS — I69359 Hemiplegia and hemiparesis following cerebral infarction affecting unspecified side: Secondary | ICD-10-CM | POA: Diagnosis not present

## 2017-08-07 DIAGNOSIS — Z125 Encounter for screening for malignant neoplasm of prostate: Secondary | ICD-10-CM | POA: Diagnosis not present

## 2017-08-07 DIAGNOSIS — I10 Essential (primary) hypertension: Secondary | ICD-10-CM | POA: Diagnosis not present

## 2017-08-07 DIAGNOSIS — E782 Mixed hyperlipidemia: Secondary | ICD-10-CM | POA: Diagnosis not present

## 2017-09-22 DIAGNOSIS — I69391 Dysphagia following cerebral infarction: Secondary | ICD-10-CM | POA: Diagnosis not present

## 2017-09-22 DIAGNOSIS — K625 Hemorrhage of anus and rectum: Secondary | ICD-10-CM | POA: Diagnosis not present

## 2017-09-22 DIAGNOSIS — G8100 Flaccid hemiplegia affecting unspecified side: Secondary | ICD-10-CM | POA: Diagnosis not present

## 2017-09-29 DIAGNOSIS — J449 Chronic obstructive pulmonary disease, unspecified: Secondary | ICD-10-CM | POA: Diagnosis not present

## 2017-09-29 DIAGNOSIS — I69359 Hemiplegia and hemiparesis following cerebral infarction affecting unspecified side: Secondary | ICD-10-CM | POA: Diagnosis not present

## 2017-09-29 DIAGNOSIS — R1312 Dysphagia, oropharyngeal phase: Secondary | ICD-10-CM | POA: Diagnosis not present

## 2017-09-29 DIAGNOSIS — I69320 Aphasia following cerebral infarction: Secondary | ICD-10-CM | POA: Diagnosis not present

## 2017-11-08 DIAGNOSIS — R1310 Dysphagia, unspecified: Secondary | ICD-10-CM | POA: Diagnosis not present

## 2017-11-08 DIAGNOSIS — Z9981 Dependence on supplemental oxygen: Secondary | ICD-10-CM | POA: Diagnosis not present

## 2017-11-08 DIAGNOSIS — I69359 Hemiplegia and hemiparesis following cerebral infarction affecting unspecified side: Secondary | ICD-10-CM | POA: Diagnosis not present

## 2017-11-08 DIAGNOSIS — R630 Anorexia: Secondary | ICD-10-CM | POA: Diagnosis not present

## 2017-11-08 DIAGNOSIS — G4734 Idiopathic sleep related nonobstructive alveolar hypoventilation: Secondary | ICD-10-CM | POA: Diagnosis not present

## 2017-11-08 DIAGNOSIS — L89152 Pressure ulcer of sacral region, stage 2: Secondary | ICD-10-CM | POA: Diagnosis not present

## 2017-11-08 DIAGNOSIS — R1312 Dysphagia, oropharyngeal phase: Secondary | ICD-10-CM | POA: Diagnosis not present

## 2017-11-08 DIAGNOSIS — I2510 Atherosclerotic heart disease of native coronary artery without angina pectoris: Secondary | ICD-10-CM | POA: Diagnosis not present

## 2017-11-08 DIAGNOSIS — Z931 Gastrostomy status: Secondary | ICD-10-CM | POA: Diagnosis not present

## 2017-11-08 DIAGNOSIS — R531 Weakness: Secondary | ICD-10-CM | POA: Diagnosis not present

## 2017-11-08 DIAGNOSIS — I10 Essential (primary) hypertension: Secondary | ICD-10-CM | POA: Diagnosis not present

## 2017-11-08 DIAGNOSIS — R0902 Hypoxemia: Secondary | ICD-10-CM | POA: Diagnosis not present

## 2017-11-08 DIAGNOSIS — Z9049 Acquired absence of other specified parts of digestive tract: Secondary | ICD-10-CM | POA: Diagnosis not present

## 2017-11-08 DIAGNOSIS — I6930 Unspecified sequelae of cerebral infarction: Secondary | ICD-10-CM | POA: Diagnosis not present

## 2017-11-08 DIAGNOSIS — M62838 Other muscle spasm: Secondary | ICD-10-CM | POA: Diagnosis not present

## 2017-11-08 DIAGNOSIS — Z9181 History of falling: Secondary | ICD-10-CM | POA: Diagnosis not present

## 2017-11-08 DIAGNOSIS — F329 Major depressive disorder, single episode, unspecified: Secondary | ICD-10-CM | POA: Diagnosis not present

## 2017-11-08 DIAGNOSIS — G8100 Flaccid hemiplegia affecting unspecified side: Secondary | ICD-10-CM | POA: Diagnosis not present

## 2017-11-09 DIAGNOSIS — I6930 Unspecified sequelae of cerebral infarction: Secondary | ICD-10-CM | POA: Diagnosis not present

## 2017-11-09 DIAGNOSIS — F329 Major depressive disorder, single episode, unspecified: Secondary | ICD-10-CM | POA: Diagnosis not present

## 2017-11-09 DIAGNOSIS — R630 Anorexia: Secondary | ICD-10-CM | POA: Diagnosis not present

## 2017-11-09 DIAGNOSIS — G8100 Flaccid hemiplegia affecting unspecified side: Secondary | ICD-10-CM | POA: Diagnosis not present

## 2017-11-09 DIAGNOSIS — G4734 Idiopathic sleep related nonobstructive alveolar hypoventilation: Secondary | ICD-10-CM | POA: Diagnosis not present

## 2017-11-09 DIAGNOSIS — I69359 Hemiplegia and hemiparesis following cerebral infarction affecting unspecified side: Secondary | ICD-10-CM | POA: Diagnosis not present

## 2017-11-10 DIAGNOSIS — R630 Anorexia: Secondary | ICD-10-CM | POA: Diagnosis not present

## 2017-11-10 DIAGNOSIS — I69359 Hemiplegia and hemiparesis following cerebral infarction affecting unspecified side: Secondary | ICD-10-CM | POA: Diagnosis not present

## 2017-11-10 DIAGNOSIS — F329 Major depressive disorder, single episode, unspecified: Secondary | ICD-10-CM | POA: Diagnosis not present

## 2017-11-10 DIAGNOSIS — G8100 Flaccid hemiplegia affecting unspecified side: Secondary | ICD-10-CM | POA: Diagnosis not present

## 2017-11-10 DIAGNOSIS — G4734 Idiopathic sleep related nonobstructive alveolar hypoventilation: Secondary | ICD-10-CM | POA: Diagnosis not present

## 2017-11-10 DIAGNOSIS — I6930 Unspecified sequelae of cerebral infarction: Secondary | ICD-10-CM | POA: Diagnosis not present

## 2017-11-13 DIAGNOSIS — R630 Anorexia: Secondary | ICD-10-CM | POA: Diagnosis not present

## 2017-11-13 DIAGNOSIS — G4734 Idiopathic sleep related nonobstructive alveolar hypoventilation: Secondary | ICD-10-CM | POA: Diagnosis not present

## 2017-11-13 DIAGNOSIS — F329 Major depressive disorder, single episode, unspecified: Secondary | ICD-10-CM | POA: Diagnosis not present

## 2017-11-13 DIAGNOSIS — I6930 Unspecified sequelae of cerebral infarction: Secondary | ICD-10-CM | POA: Diagnosis not present

## 2017-11-13 DIAGNOSIS — G8100 Flaccid hemiplegia affecting unspecified side: Secondary | ICD-10-CM | POA: Diagnosis not present

## 2017-11-13 DIAGNOSIS — I69359 Hemiplegia and hemiparesis following cerebral infarction affecting unspecified side: Secondary | ICD-10-CM | POA: Diagnosis not present

## 2017-11-14 DIAGNOSIS — G4734 Idiopathic sleep related nonobstructive alveolar hypoventilation: Secondary | ICD-10-CM | POA: Diagnosis not present

## 2017-11-14 DIAGNOSIS — I6930 Unspecified sequelae of cerebral infarction: Secondary | ICD-10-CM | POA: Diagnosis not present

## 2017-11-14 DIAGNOSIS — G8100 Flaccid hemiplegia affecting unspecified side: Secondary | ICD-10-CM | POA: Diagnosis not present

## 2017-11-14 DIAGNOSIS — F329 Major depressive disorder, single episode, unspecified: Secondary | ICD-10-CM | POA: Diagnosis not present

## 2017-11-14 DIAGNOSIS — R630 Anorexia: Secondary | ICD-10-CM | POA: Diagnosis not present

## 2017-11-14 DIAGNOSIS — I69359 Hemiplegia and hemiparesis following cerebral infarction affecting unspecified side: Secondary | ICD-10-CM | POA: Diagnosis not present

## 2017-11-15 DIAGNOSIS — G8100 Flaccid hemiplegia affecting unspecified side: Secondary | ICD-10-CM | POA: Diagnosis not present

## 2017-11-15 DIAGNOSIS — F329 Major depressive disorder, single episode, unspecified: Secondary | ICD-10-CM | POA: Diagnosis not present

## 2017-11-15 DIAGNOSIS — R630 Anorexia: Secondary | ICD-10-CM | POA: Diagnosis not present

## 2017-11-15 DIAGNOSIS — I6930 Unspecified sequelae of cerebral infarction: Secondary | ICD-10-CM | POA: Diagnosis not present

## 2017-11-15 DIAGNOSIS — G4734 Idiopathic sleep related nonobstructive alveolar hypoventilation: Secondary | ICD-10-CM | POA: Diagnosis not present

## 2017-11-15 DIAGNOSIS — I69359 Hemiplegia and hemiparesis following cerebral infarction affecting unspecified side: Secondary | ICD-10-CM | POA: Diagnosis not present

## 2017-11-16 DIAGNOSIS — R630 Anorexia: Secondary | ICD-10-CM | POA: Diagnosis not present

## 2017-11-16 DIAGNOSIS — G8100 Flaccid hemiplegia affecting unspecified side: Secondary | ICD-10-CM | POA: Diagnosis not present

## 2017-11-16 DIAGNOSIS — I6930 Unspecified sequelae of cerebral infarction: Secondary | ICD-10-CM | POA: Diagnosis not present

## 2017-11-16 DIAGNOSIS — G4734 Idiopathic sleep related nonobstructive alveolar hypoventilation: Secondary | ICD-10-CM | POA: Diagnosis not present

## 2017-11-16 DIAGNOSIS — I69359 Hemiplegia and hemiparesis following cerebral infarction affecting unspecified side: Secondary | ICD-10-CM | POA: Diagnosis not present

## 2017-11-16 DIAGNOSIS — F329 Major depressive disorder, single episode, unspecified: Secondary | ICD-10-CM | POA: Diagnosis not present

## 2017-11-17 DIAGNOSIS — I6930 Unspecified sequelae of cerebral infarction: Secondary | ICD-10-CM | POA: Diagnosis not present

## 2017-11-17 DIAGNOSIS — I69359 Hemiplegia and hemiparesis following cerebral infarction affecting unspecified side: Secondary | ICD-10-CM | POA: Diagnosis not present

## 2017-11-17 DIAGNOSIS — G8100 Flaccid hemiplegia affecting unspecified side: Secondary | ICD-10-CM | POA: Diagnosis not present

## 2017-11-17 DIAGNOSIS — F329 Major depressive disorder, single episode, unspecified: Secondary | ICD-10-CM | POA: Diagnosis not present

## 2017-11-17 DIAGNOSIS — G4734 Idiopathic sleep related nonobstructive alveolar hypoventilation: Secondary | ICD-10-CM | POA: Diagnosis not present

## 2017-11-17 DIAGNOSIS — R630 Anorexia: Secondary | ICD-10-CM | POA: Diagnosis not present

## 2017-11-21 DIAGNOSIS — G8100 Flaccid hemiplegia affecting unspecified side: Secondary | ICD-10-CM | POA: Diagnosis not present

## 2017-11-21 DIAGNOSIS — G4734 Idiopathic sleep related nonobstructive alveolar hypoventilation: Secondary | ICD-10-CM | POA: Diagnosis not present

## 2017-11-21 DIAGNOSIS — I69359 Hemiplegia and hemiparesis following cerebral infarction affecting unspecified side: Secondary | ICD-10-CM | POA: Diagnosis not present

## 2017-11-21 DIAGNOSIS — R630 Anorexia: Secondary | ICD-10-CM | POA: Diagnosis not present

## 2017-11-21 DIAGNOSIS — F329 Major depressive disorder, single episode, unspecified: Secondary | ICD-10-CM | POA: Diagnosis not present

## 2017-11-21 DIAGNOSIS — I6930 Unspecified sequelae of cerebral infarction: Secondary | ICD-10-CM | POA: Diagnosis not present

## 2017-11-22 DIAGNOSIS — I69320 Aphasia following cerebral infarction: Secondary | ICD-10-CM | POA: Diagnosis not present

## 2017-11-22 DIAGNOSIS — J449 Chronic obstructive pulmonary disease, unspecified: Secondary | ICD-10-CM | POA: Diagnosis not present

## 2017-11-22 DIAGNOSIS — I69359 Hemiplegia and hemiparesis following cerebral infarction affecting unspecified side: Secondary | ICD-10-CM | POA: Diagnosis not present

## 2017-11-22 DIAGNOSIS — R630 Anorexia: Secondary | ICD-10-CM | POA: Diagnosis not present

## 2017-11-22 DIAGNOSIS — G8100 Flaccid hemiplegia affecting unspecified side: Secondary | ICD-10-CM | POA: Diagnosis not present

## 2017-11-22 DIAGNOSIS — G4734 Idiopathic sleep related nonobstructive alveolar hypoventilation: Secondary | ICD-10-CM | POA: Diagnosis not present

## 2017-11-22 DIAGNOSIS — R1312 Dysphagia, oropharyngeal phase: Secondary | ICD-10-CM | POA: Diagnosis not present

## 2017-11-22 DIAGNOSIS — I6930 Unspecified sequelae of cerebral infarction: Secondary | ICD-10-CM | POA: Diagnosis not present

## 2017-11-22 DIAGNOSIS — F329 Major depressive disorder, single episode, unspecified: Secondary | ICD-10-CM | POA: Diagnosis not present

## 2017-11-23 DIAGNOSIS — I10 Essential (primary) hypertension: Secondary | ICD-10-CM | POA: Diagnosis not present

## 2017-11-23 DIAGNOSIS — M62838 Other muscle spasm: Secondary | ICD-10-CM | POA: Diagnosis not present

## 2017-11-23 DIAGNOSIS — I69359 Hemiplegia and hemiparesis following cerebral infarction affecting unspecified side: Secondary | ICD-10-CM | POA: Diagnosis not present

## 2017-11-23 DIAGNOSIS — I2510 Atherosclerotic heart disease of native coronary artery without angina pectoris: Secondary | ICD-10-CM | POA: Diagnosis not present

## 2017-11-23 DIAGNOSIS — Z9049 Acquired absence of other specified parts of digestive tract: Secondary | ICD-10-CM | POA: Diagnosis not present

## 2017-11-23 DIAGNOSIS — Z9181 History of falling: Secondary | ICD-10-CM | POA: Diagnosis not present

## 2017-11-23 DIAGNOSIS — L89152 Pressure ulcer of sacral region, stage 2: Secondary | ICD-10-CM | POA: Diagnosis not present

## 2017-11-23 DIAGNOSIS — R630 Anorexia: Secondary | ICD-10-CM | POA: Diagnosis not present

## 2017-11-23 DIAGNOSIS — Z931 Gastrostomy status: Secondary | ICD-10-CM | POA: Diagnosis not present

## 2017-11-23 DIAGNOSIS — R1310 Dysphagia, unspecified: Secondary | ICD-10-CM | POA: Diagnosis not present

## 2017-11-23 DIAGNOSIS — R531 Weakness: Secondary | ICD-10-CM | POA: Diagnosis not present

## 2017-11-23 DIAGNOSIS — G8100 Flaccid hemiplegia affecting unspecified side: Secondary | ICD-10-CM | POA: Diagnosis not present

## 2017-11-23 DIAGNOSIS — I6930 Unspecified sequelae of cerebral infarction: Secondary | ICD-10-CM | POA: Diagnosis not present

## 2017-11-23 DIAGNOSIS — G4734 Idiopathic sleep related nonobstructive alveolar hypoventilation: Secondary | ICD-10-CM | POA: Diagnosis not present

## 2017-11-23 DIAGNOSIS — R1312 Dysphagia, oropharyngeal phase: Secondary | ICD-10-CM | POA: Diagnosis not present

## 2017-11-23 DIAGNOSIS — F329 Major depressive disorder, single episode, unspecified: Secondary | ICD-10-CM | POA: Diagnosis not present

## 2017-11-23 DIAGNOSIS — Z9981 Dependence on supplemental oxygen: Secondary | ICD-10-CM | POA: Diagnosis not present

## 2017-11-23 DIAGNOSIS — R0902 Hypoxemia: Secondary | ICD-10-CM | POA: Diagnosis not present

## 2017-11-24 DIAGNOSIS — G8100 Flaccid hemiplegia affecting unspecified side: Secondary | ICD-10-CM | POA: Diagnosis not present

## 2017-11-24 DIAGNOSIS — I6930 Unspecified sequelae of cerebral infarction: Secondary | ICD-10-CM | POA: Diagnosis not present

## 2017-11-24 DIAGNOSIS — F329 Major depressive disorder, single episode, unspecified: Secondary | ICD-10-CM | POA: Diagnosis not present

## 2017-11-24 DIAGNOSIS — R630 Anorexia: Secondary | ICD-10-CM | POA: Diagnosis not present

## 2017-11-24 DIAGNOSIS — I69359 Hemiplegia and hemiparesis following cerebral infarction affecting unspecified side: Secondary | ICD-10-CM | POA: Diagnosis not present

## 2017-11-24 DIAGNOSIS — G4734 Idiopathic sleep related nonobstructive alveolar hypoventilation: Secondary | ICD-10-CM | POA: Diagnosis not present

## 2017-11-27 DIAGNOSIS — G8100 Flaccid hemiplegia affecting unspecified side: Secondary | ICD-10-CM | POA: Diagnosis not present

## 2017-11-27 DIAGNOSIS — G4734 Idiopathic sleep related nonobstructive alveolar hypoventilation: Secondary | ICD-10-CM | POA: Diagnosis not present

## 2017-11-27 DIAGNOSIS — I69359 Hemiplegia and hemiparesis following cerebral infarction affecting unspecified side: Secondary | ICD-10-CM | POA: Diagnosis not present

## 2017-11-27 DIAGNOSIS — I6930 Unspecified sequelae of cerebral infarction: Secondary | ICD-10-CM | POA: Diagnosis not present

## 2017-11-27 DIAGNOSIS — F329 Major depressive disorder, single episode, unspecified: Secondary | ICD-10-CM | POA: Diagnosis not present

## 2017-11-27 DIAGNOSIS — R630 Anorexia: Secondary | ICD-10-CM | POA: Diagnosis not present

## 2017-11-28 DIAGNOSIS — I69359 Hemiplegia and hemiparesis following cerebral infarction affecting unspecified side: Secondary | ICD-10-CM | POA: Diagnosis not present

## 2017-11-28 DIAGNOSIS — R630 Anorexia: Secondary | ICD-10-CM | POA: Diagnosis not present

## 2017-11-28 DIAGNOSIS — G4734 Idiopathic sleep related nonobstructive alveolar hypoventilation: Secondary | ICD-10-CM | POA: Diagnosis not present

## 2017-11-28 DIAGNOSIS — F329 Major depressive disorder, single episode, unspecified: Secondary | ICD-10-CM | POA: Diagnosis not present

## 2017-11-28 DIAGNOSIS — I6930 Unspecified sequelae of cerebral infarction: Secondary | ICD-10-CM | POA: Diagnosis not present

## 2017-11-28 DIAGNOSIS — G8100 Flaccid hemiplegia affecting unspecified side: Secondary | ICD-10-CM | POA: Diagnosis not present

## 2017-11-29 DIAGNOSIS — G4734 Idiopathic sleep related nonobstructive alveolar hypoventilation: Secondary | ICD-10-CM | POA: Diagnosis not present

## 2017-11-29 DIAGNOSIS — G8100 Flaccid hemiplegia affecting unspecified side: Secondary | ICD-10-CM | POA: Diagnosis not present

## 2017-11-29 DIAGNOSIS — I69359 Hemiplegia and hemiparesis following cerebral infarction affecting unspecified side: Secondary | ICD-10-CM | POA: Diagnosis not present

## 2017-11-29 DIAGNOSIS — F329 Major depressive disorder, single episode, unspecified: Secondary | ICD-10-CM | POA: Diagnosis not present

## 2017-11-29 DIAGNOSIS — I6930 Unspecified sequelae of cerebral infarction: Secondary | ICD-10-CM | POA: Diagnosis not present

## 2017-11-29 DIAGNOSIS — R630 Anorexia: Secondary | ICD-10-CM | POA: Diagnosis not present

## 2017-11-30 DIAGNOSIS — I69359 Hemiplegia and hemiparesis following cerebral infarction affecting unspecified side: Secondary | ICD-10-CM | POA: Diagnosis not present

## 2017-11-30 DIAGNOSIS — I6930 Unspecified sequelae of cerebral infarction: Secondary | ICD-10-CM | POA: Diagnosis not present

## 2017-11-30 DIAGNOSIS — G4734 Idiopathic sleep related nonobstructive alveolar hypoventilation: Secondary | ICD-10-CM | POA: Diagnosis not present

## 2017-11-30 DIAGNOSIS — F329 Major depressive disorder, single episode, unspecified: Secondary | ICD-10-CM | POA: Diagnosis not present

## 2017-11-30 DIAGNOSIS — R630 Anorexia: Secondary | ICD-10-CM | POA: Diagnosis not present

## 2017-11-30 DIAGNOSIS — G8100 Flaccid hemiplegia affecting unspecified side: Secondary | ICD-10-CM | POA: Diagnosis not present

## 2017-12-01 DIAGNOSIS — I6930 Unspecified sequelae of cerebral infarction: Secondary | ICD-10-CM | POA: Diagnosis not present

## 2017-12-01 DIAGNOSIS — I69359 Hemiplegia and hemiparesis following cerebral infarction affecting unspecified side: Secondary | ICD-10-CM | POA: Diagnosis not present

## 2017-12-01 DIAGNOSIS — G4734 Idiopathic sleep related nonobstructive alveolar hypoventilation: Secondary | ICD-10-CM | POA: Diagnosis not present

## 2017-12-01 DIAGNOSIS — G8100 Flaccid hemiplegia affecting unspecified side: Secondary | ICD-10-CM | POA: Diagnosis not present

## 2017-12-01 DIAGNOSIS — R630 Anorexia: Secondary | ICD-10-CM | POA: Diagnosis not present

## 2017-12-01 DIAGNOSIS — F329 Major depressive disorder, single episode, unspecified: Secondary | ICD-10-CM | POA: Diagnosis not present

## 2017-12-03 DIAGNOSIS — F329 Major depressive disorder, single episode, unspecified: Secondary | ICD-10-CM | POA: Diagnosis not present

## 2017-12-03 DIAGNOSIS — G4734 Idiopathic sleep related nonobstructive alveolar hypoventilation: Secondary | ICD-10-CM | POA: Diagnosis not present

## 2017-12-03 DIAGNOSIS — R630 Anorexia: Secondary | ICD-10-CM | POA: Diagnosis not present

## 2017-12-03 DIAGNOSIS — G8100 Flaccid hemiplegia affecting unspecified side: Secondary | ICD-10-CM | POA: Diagnosis not present

## 2017-12-03 DIAGNOSIS — I69359 Hemiplegia and hemiparesis following cerebral infarction affecting unspecified side: Secondary | ICD-10-CM | POA: Diagnosis not present

## 2017-12-03 DIAGNOSIS — I6930 Unspecified sequelae of cerebral infarction: Secondary | ICD-10-CM | POA: Diagnosis not present

## 2017-12-04 DIAGNOSIS — R630 Anorexia: Secondary | ICD-10-CM | POA: Diagnosis not present

## 2017-12-04 DIAGNOSIS — I6930 Unspecified sequelae of cerebral infarction: Secondary | ICD-10-CM | POA: Diagnosis not present

## 2017-12-04 DIAGNOSIS — G4734 Idiopathic sleep related nonobstructive alveolar hypoventilation: Secondary | ICD-10-CM | POA: Diagnosis not present

## 2017-12-04 DIAGNOSIS — G8100 Flaccid hemiplegia affecting unspecified side: Secondary | ICD-10-CM | POA: Diagnosis not present

## 2017-12-04 DIAGNOSIS — I69359 Hemiplegia and hemiparesis following cerebral infarction affecting unspecified side: Secondary | ICD-10-CM | POA: Diagnosis not present

## 2017-12-04 DIAGNOSIS — F329 Major depressive disorder, single episode, unspecified: Secondary | ICD-10-CM | POA: Diagnosis not present

## 2017-12-05 DIAGNOSIS — I6930 Unspecified sequelae of cerebral infarction: Secondary | ICD-10-CM | POA: Diagnosis not present

## 2017-12-05 DIAGNOSIS — G8100 Flaccid hemiplegia affecting unspecified side: Secondary | ICD-10-CM | POA: Diagnosis not present

## 2017-12-05 DIAGNOSIS — G4734 Idiopathic sleep related nonobstructive alveolar hypoventilation: Secondary | ICD-10-CM | POA: Diagnosis not present

## 2017-12-05 DIAGNOSIS — F329 Major depressive disorder, single episode, unspecified: Secondary | ICD-10-CM | POA: Diagnosis not present

## 2017-12-05 DIAGNOSIS — I69359 Hemiplegia and hemiparesis following cerebral infarction affecting unspecified side: Secondary | ICD-10-CM | POA: Diagnosis not present

## 2017-12-05 DIAGNOSIS — R630 Anorexia: Secondary | ICD-10-CM | POA: Diagnosis not present

## 2017-12-06 DIAGNOSIS — G8100 Flaccid hemiplegia affecting unspecified side: Secondary | ICD-10-CM | POA: Diagnosis not present

## 2017-12-06 DIAGNOSIS — R630 Anorexia: Secondary | ICD-10-CM | POA: Diagnosis not present

## 2017-12-06 DIAGNOSIS — I69359 Hemiplegia and hemiparesis following cerebral infarction affecting unspecified side: Secondary | ICD-10-CM | POA: Diagnosis not present

## 2017-12-06 DIAGNOSIS — G4734 Idiopathic sleep related nonobstructive alveolar hypoventilation: Secondary | ICD-10-CM | POA: Diagnosis not present

## 2017-12-06 DIAGNOSIS — F329 Major depressive disorder, single episode, unspecified: Secondary | ICD-10-CM | POA: Diagnosis not present

## 2017-12-06 DIAGNOSIS — I6930 Unspecified sequelae of cerebral infarction: Secondary | ICD-10-CM | POA: Diagnosis not present

## 2017-12-07 DIAGNOSIS — G4734 Idiopathic sleep related nonobstructive alveolar hypoventilation: Secondary | ICD-10-CM | POA: Diagnosis not present

## 2017-12-07 DIAGNOSIS — I6930 Unspecified sequelae of cerebral infarction: Secondary | ICD-10-CM | POA: Diagnosis not present

## 2017-12-07 DIAGNOSIS — I69359 Hemiplegia and hemiparesis following cerebral infarction affecting unspecified side: Secondary | ICD-10-CM | POA: Diagnosis not present

## 2017-12-07 DIAGNOSIS — F329 Major depressive disorder, single episode, unspecified: Secondary | ICD-10-CM | POA: Diagnosis not present

## 2017-12-07 DIAGNOSIS — R630 Anorexia: Secondary | ICD-10-CM | POA: Diagnosis not present

## 2017-12-07 DIAGNOSIS — G8100 Flaccid hemiplegia affecting unspecified side: Secondary | ICD-10-CM | POA: Diagnosis not present

## 2017-12-08 DIAGNOSIS — G4734 Idiopathic sleep related nonobstructive alveolar hypoventilation: Secondary | ICD-10-CM | POA: Diagnosis not present

## 2017-12-08 DIAGNOSIS — I6930 Unspecified sequelae of cerebral infarction: Secondary | ICD-10-CM | POA: Diagnosis not present

## 2017-12-08 DIAGNOSIS — F329 Major depressive disorder, single episode, unspecified: Secondary | ICD-10-CM | POA: Diagnosis not present

## 2017-12-08 DIAGNOSIS — I69359 Hemiplegia and hemiparesis following cerebral infarction affecting unspecified side: Secondary | ICD-10-CM | POA: Diagnosis not present

## 2017-12-08 DIAGNOSIS — G8100 Flaccid hemiplegia affecting unspecified side: Secondary | ICD-10-CM | POA: Diagnosis not present

## 2017-12-08 DIAGNOSIS — R630 Anorexia: Secondary | ICD-10-CM | POA: Diagnosis not present

## 2017-12-11 DIAGNOSIS — G4734 Idiopathic sleep related nonobstructive alveolar hypoventilation: Secondary | ICD-10-CM | POA: Diagnosis not present

## 2017-12-11 DIAGNOSIS — I69359 Hemiplegia and hemiparesis following cerebral infarction affecting unspecified side: Secondary | ICD-10-CM | POA: Diagnosis not present

## 2017-12-11 DIAGNOSIS — F329 Major depressive disorder, single episode, unspecified: Secondary | ICD-10-CM | POA: Diagnosis not present

## 2017-12-11 DIAGNOSIS — R630 Anorexia: Secondary | ICD-10-CM | POA: Diagnosis not present

## 2017-12-11 DIAGNOSIS — G8100 Flaccid hemiplegia affecting unspecified side: Secondary | ICD-10-CM | POA: Diagnosis not present

## 2017-12-11 DIAGNOSIS — I6930 Unspecified sequelae of cerebral infarction: Secondary | ICD-10-CM | POA: Diagnosis not present

## 2017-12-12 DIAGNOSIS — R630 Anorexia: Secondary | ICD-10-CM | POA: Diagnosis not present

## 2017-12-12 DIAGNOSIS — I69359 Hemiplegia and hemiparesis following cerebral infarction affecting unspecified side: Secondary | ICD-10-CM | POA: Diagnosis not present

## 2017-12-12 DIAGNOSIS — I6930 Unspecified sequelae of cerebral infarction: Secondary | ICD-10-CM | POA: Diagnosis not present

## 2017-12-12 DIAGNOSIS — F329 Major depressive disorder, single episode, unspecified: Secondary | ICD-10-CM | POA: Diagnosis not present

## 2017-12-12 DIAGNOSIS — G4734 Idiopathic sleep related nonobstructive alveolar hypoventilation: Secondary | ICD-10-CM | POA: Diagnosis not present

## 2017-12-12 DIAGNOSIS — G8100 Flaccid hemiplegia affecting unspecified side: Secondary | ICD-10-CM | POA: Diagnosis not present

## 2017-12-13 DIAGNOSIS — G4734 Idiopathic sleep related nonobstructive alveolar hypoventilation: Secondary | ICD-10-CM | POA: Diagnosis not present

## 2017-12-13 DIAGNOSIS — I69359 Hemiplegia and hemiparesis following cerebral infarction affecting unspecified side: Secondary | ICD-10-CM | POA: Diagnosis not present

## 2017-12-13 DIAGNOSIS — R630 Anorexia: Secondary | ICD-10-CM | POA: Diagnosis not present

## 2017-12-13 DIAGNOSIS — I6930 Unspecified sequelae of cerebral infarction: Secondary | ICD-10-CM | POA: Diagnosis not present

## 2017-12-13 DIAGNOSIS — G8100 Flaccid hemiplegia affecting unspecified side: Secondary | ICD-10-CM | POA: Diagnosis not present

## 2017-12-13 DIAGNOSIS — F329 Major depressive disorder, single episode, unspecified: Secondary | ICD-10-CM | POA: Diagnosis not present

## 2017-12-14 DIAGNOSIS — I6930 Unspecified sequelae of cerebral infarction: Secondary | ICD-10-CM | POA: Diagnosis not present

## 2017-12-14 DIAGNOSIS — G4734 Idiopathic sleep related nonobstructive alveolar hypoventilation: Secondary | ICD-10-CM | POA: Diagnosis not present

## 2017-12-14 DIAGNOSIS — I69359 Hemiplegia and hemiparesis following cerebral infarction affecting unspecified side: Secondary | ICD-10-CM | POA: Diagnosis not present

## 2017-12-14 DIAGNOSIS — G8100 Flaccid hemiplegia affecting unspecified side: Secondary | ICD-10-CM | POA: Diagnosis not present

## 2017-12-14 DIAGNOSIS — F329 Major depressive disorder, single episode, unspecified: Secondary | ICD-10-CM | POA: Diagnosis not present

## 2017-12-14 DIAGNOSIS — R630 Anorexia: Secondary | ICD-10-CM | POA: Diagnosis not present

## 2017-12-15 DIAGNOSIS — I69359 Hemiplegia and hemiparesis following cerebral infarction affecting unspecified side: Secondary | ICD-10-CM | POA: Diagnosis not present

## 2017-12-15 DIAGNOSIS — F329 Major depressive disorder, single episode, unspecified: Secondary | ICD-10-CM | POA: Diagnosis not present

## 2017-12-15 DIAGNOSIS — G4734 Idiopathic sleep related nonobstructive alveolar hypoventilation: Secondary | ICD-10-CM | POA: Diagnosis not present

## 2017-12-15 DIAGNOSIS — G8100 Flaccid hemiplegia affecting unspecified side: Secondary | ICD-10-CM | POA: Diagnosis not present

## 2017-12-15 DIAGNOSIS — R630 Anorexia: Secondary | ICD-10-CM | POA: Diagnosis not present

## 2017-12-15 DIAGNOSIS — I6930 Unspecified sequelae of cerebral infarction: Secondary | ICD-10-CM | POA: Diagnosis not present

## 2017-12-16 DIAGNOSIS — G4734 Idiopathic sleep related nonobstructive alveolar hypoventilation: Secondary | ICD-10-CM | POA: Diagnosis not present

## 2017-12-16 DIAGNOSIS — F329 Major depressive disorder, single episode, unspecified: Secondary | ICD-10-CM | POA: Diagnosis not present

## 2017-12-16 DIAGNOSIS — I69359 Hemiplegia and hemiparesis following cerebral infarction affecting unspecified side: Secondary | ICD-10-CM | POA: Diagnosis not present

## 2017-12-16 DIAGNOSIS — I6930 Unspecified sequelae of cerebral infarction: Secondary | ICD-10-CM | POA: Diagnosis not present

## 2017-12-16 DIAGNOSIS — R630 Anorexia: Secondary | ICD-10-CM | POA: Diagnosis not present

## 2017-12-16 DIAGNOSIS — G8100 Flaccid hemiplegia affecting unspecified side: Secondary | ICD-10-CM | POA: Diagnosis not present

## 2017-12-18 DIAGNOSIS — G8100 Flaccid hemiplegia affecting unspecified side: Secondary | ICD-10-CM | POA: Diagnosis not present

## 2017-12-18 DIAGNOSIS — R630 Anorexia: Secondary | ICD-10-CM | POA: Diagnosis not present

## 2017-12-18 DIAGNOSIS — I6930 Unspecified sequelae of cerebral infarction: Secondary | ICD-10-CM | POA: Diagnosis not present

## 2017-12-18 DIAGNOSIS — F329 Major depressive disorder, single episode, unspecified: Secondary | ICD-10-CM | POA: Diagnosis not present

## 2017-12-18 DIAGNOSIS — G4734 Idiopathic sleep related nonobstructive alveolar hypoventilation: Secondary | ICD-10-CM | POA: Diagnosis not present

## 2017-12-18 DIAGNOSIS — I69359 Hemiplegia and hemiparesis following cerebral infarction affecting unspecified side: Secondary | ICD-10-CM | POA: Diagnosis not present

## 2017-12-19 DIAGNOSIS — G8100 Flaccid hemiplegia affecting unspecified side: Secondary | ICD-10-CM | POA: Diagnosis not present

## 2017-12-19 DIAGNOSIS — G4734 Idiopathic sleep related nonobstructive alveolar hypoventilation: Secondary | ICD-10-CM | POA: Diagnosis not present

## 2017-12-19 DIAGNOSIS — I6930 Unspecified sequelae of cerebral infarction: Secondary | ICD-10-CM | POA: Diagnosis not present

## 2017-12-19 DIAGNOSIS — F329 Major depressive disorder, single episode, unspecified: Secondary | ICD-10-CM | POA: Diagnosis not present

## 2017-12-19 DIAGNOSIS — R630 Anorexia: Secondary | ICD-10-CM | POA: Diagnosis not present

## 2017-12-19 DIAGNOSIS — I69359 Hemiplegia and hemiparesis following cerebral infarction affecting unspecified side: Secondary | ICD-10-CM | POA: Diagnosis not present

## 2017-12-20 DIAGNOSIS — G4734 Idiopathic sleep related nonobstructive alveolar hypoventilation: Secondary | ICD-10-CM | POA: Diagnosis not present

## 2017-12-20 DIAGNOSIS — I6930 Unspecified sequelae of cerebral infarction: Secondary | ICD-10-CM | POA: Diagnosis not present

## 2017-12-20 DIAGNOSIS — R630 Anorexia: Secondary | ICD-10-CM | POA: Diagnosis not present

## 2017-12-20 DIAGNOSIS — I69359 Hemiplegia and hemiparesis following cerebral infarction affecting unspecified side: Secondary | ICD-10-CM | POA: Diagnosis not present

## 2017-12-20 DIAGNOSIS — F329 Major depressive disorder, single episode, unspecified: Secondary | ICD-10-CM | POA: Diagnosis not present

## 2017-12-20 DIAGNOSIS — G8100 Flaccid hemiplegia affecting unspecified side: Secondary | ICD-10-CM | POA: Diagnosis not present

## 2017-12-21 DIAGNOSIS — I6930 Unspecified sequelae of cerebral infarction: Secondary | ICD-10-CM | POA: Diagnosis not present

## 2017-12-21 DIAGNOSIS — F329 Major depressive disorder, single episode, unspecified: Secondary | ICD-10-CM | POA: Diagnosis not present

## 2017-12-21 DIAGNOSIS — I69359 Hemiplegia and hemiparesis following cerebral infarction affecting unspecified side: Secondary | ICD-10-CM | POA: Diagnosis not present

## 2017-12-21 DIAGNOSIS — R630 Anorexia: Secondary | ICD-10-CM | POA: Diagnosis not present

## 2017-12-21 DIAGNOSIS — G8100 Flaccid hemiplegia affecting unspecified side: Secondary | ICD-10-CM | POA: Diagnosis not present

## 2017-12-21 DIAGNOSIS — G4734 Idiopathic sleep related nonobstructive alveolar hypoventilation: Secondary | ICD-10-CM | POA: Diagnosis not present

## 2017-12-22 DIAGNOSIS — I69320 Aphasia following cerebral infarction: Secondary | ICD-10-CM | POA: Diagnosis not present

## 2017-12-22 DIAGNOSIS — G8100 Flaccid hemiplegia affecting unspecified side: Secondary | ICD-10-CM | POA: Diagnosis not present

## 2017-12-22 DIAGNOSIS — G4734 Idiopathic sleep related nonobstructive alveolar hypoventilation: Secondary | ICD-10-CM | POA: Diagnosis not present

## 2017-12-22 DIAGNOSIS — R630 Anorexia: Secondary | ICD-10-CM | POA: Diagnosis not present

## 2017-12-22 DIAGNOSIS — F329 Major depressive disorder, single episode, unspecified: Secondary | ICD-10-CM | POA: Diagnosis not present

## 2017-12-22 DIAGNOSIS — R1312 Dysphagia, oropharyngeal phase: Secondary | ICD-10-CM | POA: Diagnosis not present

## 2017-12-22 DIAGNOSIS — I69359 Hemiplegia and hemiparesis following cerebral infarction affecting unspecified side: Secondary | ICD-10-CM | POA: Diagnosis not present

## 2017-12-22 DIAGNOSIS — I6930 Unspecified sequelae of cerebral infarction: Secondary | ICD-10-CM | POA: Diagnosis not present

## 2017-12-22 DIAGNOSIS — J449 Chronic obstructive pulmonary disease, unspecified: Secondary | ICD-10-CM | POA: Diagnosis not present

## 2017-12-23 DIAGNOSIS — I69359 Hemiplegia and hemiparesis following cerebral infarction affecting unspecified side: Secondary | ICD-10-CM | POA: Diagnosis not present

## 2017-12-23 DIAGNOSIS — R630 Anorexia: Secondary | ICD-10-CM | POA: Diagnosis not present

## 2017-12-23 DIAGNOSIS — F329 Major depressive disorder, single episode, unspecified: Secondary | ICD-10-CM | POA: Diagnosis not present

## 2017-12-23 DIAGNOSIS — G8100 Flaccid hemiplegia affecting unspecified side: Secondary | ICD-10-CM | POA: Diagnosis not present

## 2017-12-23 DIAGNOSIS — G4734 Idiopathic sleep related nonobstructive alveolar hypoventilation: Secondary | ICD-10-CM | POA: Diagnosis not present

## 2017-12-23 DIAGNOSIS — I6930 Unspecified sequelae of cerebral infarction: Secondary | ICD-10-CM | POA: Diagnosis not present

## 2017-12-24 DIAGNOSIS — F329 Major depressive disorder, single episode, unspecified: Secondary | ICD-10-CM | POA: Diagnosis not present

## 2017-12-24 DIAGNOSIS — Z9981 Dependence on supplemental oxygen: Secondary | ICD-10-CM | POA: Diagnosis not present

## 2017-12-24 DIAGNOSIS — I2510 Atherosclerotic heart disease of native coronary artery without angina pectoris: Secondary | ICD-10-CM | POA: Diagnosis not present

## 2017-12-24 DIAGNOSIS — R1312 Dysphagia, oropharyngeal phase: Secondary | ICD-10-CM | POA: Diagnosis not present

## 2017-12-24 DIAGNOSIS — R0902 Hypoxemia: Secondary | ICD-10-CM | POA: Diagnosis not present

## 2017-12-24 DIAGNOSIS — Z9049 Acquired absence of other specified parts of digestive tract: Secondary | ICD-10-CM | POA: Diagnosis not present

## 2017-12-24 DIAGNOSIS — Z931 Gastrostomy status: Secondary | ICD-10-CM | POA: Diagnosis not present

## 2017-12-24 DIAGNOSIS — M62838 Other muscle spasm: Secondary | ICD-10-CM | POA: Diagnosis not present

## 2017-12-24 DIAGNOSIS — R1310 Dysphagia, unspecified: Secondary | ICD-10-CM | POA: Diagnosis not present

## 2017-12-24 DIAGNOSIS — G4734 Idiopathic sleep related nonobstructive alveolar hypoventilation: Secondary | ICD-10-CM | POA: Diagnosis not present

## 2017-12-24 DIAGNOSIS — R531 Weakness: Secondary | ICD-10-CM | POA: Diagnosis not present

## 2017-12-24 DIAGNOSIS — G8100 Flaccid hemiplegia affecting unspecified side: Secondary | ICD-10-CM | POA: Diagnosis not present

## 2017-12-24 DIAGNOSIS — Z9181 History of falling: Secondary | ICD-10-CM | POA: Diagnosis not present

## 2017-12-24 DIAGNOSIS — L89152 Pressure ulcer of sacral region, stage 2: Secondary | ICD-10-CM | POA: Diagnosis not present

## 2017-12-24 DIAGNOSIS — I69359 Hemiplegia and hemiparesis following cerebral infarction affecting unspecified side: Secondary | ICD-10-CM | POA: Diagnosis not present

## 2017-12-24 DIAGNOSIS — I10 Essential (primary) hypertension: Secondary | ICD-10-CM | POA: Diagnosis not present

## 2017-12-24 DIAGNOSIS — R630 Anorexia: Secondary | ICD-10-CM | POA: Diagnosis not present

## 2017-12-24 DIAGNOSIS — I6930 Unspecified sequelae of cerebral infarction: Secondary | ICD-10-CM | POA: Diagnosis not present

## 2017-12-25 DIAGNOSIS — G8100 Flaccid hemiplegia affecting unspecified side: Secondary | ICD-10-CM | POA: Diagnosis not present

## 2017-12-25 DIAGNOSIS — R630 Anorexia: Secondary | ICD-10-CM | POA: Diagnosis not present

## 2017-12-25 DIAGNOSIS — I69359 Hemiplegia and hemiparesis following cerebral infarction affecting unspecified side: Secondary | ICD-10-CM | POA: Diagnosis not present

## 2017-12-25 DIAGNOSIS — I6930 Unspecified sequelae of cerebral infarction: Secondary | ICD-10-CM | POA: Diagnosis not present

## 2017-12-25 DIAGNOSIS — F329 Major depressive disorder, single episode, unspecified: Secondary | ICD-10-CM | POA: Diagnosis not present

## 2017-12-25 DIAGNOSIS — G4734 Idiopathic sleep related nonobstructive alveolar hypoventilation: Secondary | ICD-10-CM | POA: Diagnosis not present

## 2017-12-26 DIAGNOSIS — I6930 Unspecified sequelae of cerebral infarction: Secondary | ICD-10-CM | POA: Diagnosis not present

## 2017-12-26 DIAGNOSIS — R630 Anorexia: Secondary | ICD-10-CM | POA: Diagnosis not present

## 2017-12-26 DIAGNOSIS — G4734 Idiopathic sleep related nonobstructive alveolar hypoventilation: Secondary | ICD-10-CM | POA: Diagnosis not present

## 2017-12-26 DIAGNOSIS — F329 Major depressive disorder, single episode, unspecified: Secondary | ICD-10-CM | POA: Diagnosis not present

## 2017-12-26 DIAGNOSIS — G8100 Flaccid hemiplegia affecting unspecified side: Secondary | ICD-10-CM | POA: Diagnosis not present

## 2017-12-26 DIAGNOSIS — I69359 Hemiplegia and hemiparesis following cerebral infarction affecting unspecified side: Secondary | ICD-10-CM | POA: Diagnosis not present

## 2017-12-27 DIAGNOSIS — G8100 Flaccid hemiplegia affecting unspecified side: Secondary | ICD-10-CM | POA: Diagnosis not present

## 2017-12-27 DIAGNOSIS — G4734 Idiopathic sleep related nonobstructive alveolar hypoventilation: Secondary | ICD-10-CM | POA: Diagnosis not present

## 2017-12-27 DIAGNOSIS — I69359 Hemiplegia and hemiparesis following cerebral infarction affecting unspecified side: Secondary | ICD-10-CM | POA: Diagnosis not present

## 2017-12-27 DIAGNOSIS — I6930 Unspecified sequelae of cerebral infarction: Secondary | ICD-10-CM | POA: Diagnosis not present

## 2017-12-27 DIAGNOSIS — F329 Major depressive disorder, single episode, unspecified: Secondary | ICD-10-CM | POA: Diagnosis not present

## 2017-12-27 DIAGNOSIS — R630 Anorexia: Secondary | ICD-10-CM | POA: Diagnosis not present

## 2017-12-28 DIAGNOSIS — I6930 Unspecified sequelae of cerebral infarction: Secondary | ICD-10-CM | POA: Diagnosis not present

## 2017-12-28 DIAGNOSIS — F329 Major depressive disorder, single episode, unspecified: Secondary | ICD-10-CM | POA: Diagnosis not present

## 2017-12-28 DIAGNOSIS — I69359 Hemiplegia and hemiparesis following cerebral infarction affecting unspecified side: Secondary | ICD-10-CM | POA: Diagnosis not present

## 2017-12-28 DIAGNOSIS — G8100 Flaccid hemiplegia affecting unspecified side: Secondary | ICD-10-CM | POA: Diagnosis not present

## 2017-12-28 DIAGNOSIS — R630 Anorexia: Secondary | ICD-10-CM | POA: Diagnosis not present

## 2017-12-28 DIAGNOSIS — G4734 Idiopathic sleep related nonobstructive alveolar hypoventilation: Secondary | ICD-10-CM | POA: Diagnosis not present

## 2017-12-29 DIAGNOSIS — I69359 Hemiplegia and hemiparesis following cerebral infarction affecting unspecified side: Secondary | ICD-10-CM | POA: Diagnosis not present

## 2017-12-29 DIAGNOSIS — I6930 Unspecified sequelae of cerebral infarction: Secondary | ICD-10-CM | POA: Diagnosis not present

## 2017-12-29 DIAGNOSIS — G4734 Idiopathic sleep related nonobstructive alveolar hypoventilation: Secondary | ICD-10-CM | POA: Diagnosis not present

## 2017-12-29 DIAGNOSIS — G8100 Flaccid hemiplegia affecting unspecified side: Secondary | ICD-10-CM | POA: Diagnosis not present

## 2017-12-29 DIAGNOSIS — R630 Anorexia: Secondary | ICD-10-CM | POA: Diagnosis not present

## 2017-12-29 DIAGNOSIS — F329 Major depressive disorder, single episode, unspecified: Secondary | ICD-10-CM | POA: Diagnosis not present

## 2018-01-01 DIAGNOSIS — F329 Major depressive disorder, single episode, unspecified: Secondary | ICD-10-CM | POA: Diagnosis not present

## 2018-01-01 DIAGNOSIS — I69359 Hemiplegia and hemiparesis following cerebral infarction affecting unspecified side: Secondary | ICD-10-CM | POA: Diagnosis not present

## 2018-01-01 DIAGNOSIS — I6930 Unspecified sequelae of cerebral infarction: Secondary | ICD-10-CM | POA: Diagnosis not present

## 2018-01-01 DIAGNOSIS — R630 Anorexia: Secondary | ICD-10-CM | POA: Diagnosis not present

## 2018-01-01 DIAGNOSIS — G8100 Flaccid hemiplegia affecting unspecified side: Secondary | ICD-10-CM | POA: Diagnosis not present

## 2018-01-01 DIAGNOSIS — G4734 Idiopathic sleep related nonobstructive alveolar hypoventilation: Secondary | ICD-10-CM | POA: Diagnosis not present

## 2018-01-02 DIAGNOSIS — G8100 Flaccid hemiplegia affecting unspecified side: Secondary | ICD-10-CM | POA: Diagnosis not present

## 2018-01-02 DIAGNOSIS — I69359 Hemiplegia and hemiparesis following cerebral infarction affecting unspecified side: Secondary | ICD-10-CM | POA: Diagnosis not present

## 2018-01-02 DIAGNOSIS — I6930 Unspecified sequelae of cerebral infarction: Secondary | ICD-10-CM | POA: Diagnosis not present

## 2018-01-02 DIAGNOSIS — G4734 Idiopathic sleep related nonobstructive alveolar hypoventilation: Secondary | ICD-10-CM | POA: Diagnosis not present

## 2018-01-02 DIAGNOSIS — R630 Anorexia: Secondary | ICD-10-CM | POA: Diagnosis not present

## 2018-01-02 DIAGNOSIS — F329 Major depressive disorder, single episode, unspecified: Secondary | ICD-10-CM | POA: Diagnosis not present

## 2018-01-03 DIAGNOSIS — I69359 Hemiplegia and hemiparesis following cerebral infarction affecting unspecified side: Secondary | ICD-10-CM | POA: Diagnosis not present

## 2018-01-03 DIAGNOSIS — G8100 Flaccid hemiplegia affecting unspecified side: Secondary | ICD-10-CM | POA: Diagnosis not present

## 2018-01-03 DIAGNOSIS — R630 Anorexia: Secondary | ICD-10-CM | POA: Diagnosis not present

## 2018-01-03 DIAGNOSIS — F329 Major depressive disorder, single episode, unspecified: Secondary | ICD-10-CM | POA: Diagnosis not present

## 2018-01-03 DIAGNOSIS — I6930 Unspecified sequelae of cerebral infarction: Secondary | ICD-10-CM | POA: Diagnosis not present

## 2018-01-03 DIAGNOSIS — G4734 Idiopathic sleep related nonobstructive alveolar hypoventilation: Secondary | ICD-10-CM | POA: Diagnosis not present

## 2018-01-04 DIAGNOSIS — I6930 Unspecified sequelae of cerebral infarction: Secondary | ICD-10-CM | POA: Diagnosis not present

## 2018-01-04 DIAGNOSIS — I69359 Hemiplegia and hemiparesis following cerebral infarction affecting unspecified side: Secondary | ICD-10-CM | POA: Diagnosis not present

## 2018-01-04 DIAGNOSIS — G4734 Idiopathic sleep related nonobstructive alveolar hypoventilation: Secondary | ICD-10-CM | POA: Diagnosis not present

## 2018-01-04 DIAGNOSIS — F329 Major depressive disorder, single episode, unspecified: Secondary | ICD-10-CM | POA: Diagnosis not present

## 2018-01-04 DIAGNOSIS — R630 Anorexia: Secondary | ICD-10-CM | POA: Diagnosis not present

## 2018-01-04 DIAGNOSIS — G8100 Flaccid hemiplegia affecting unspecified side: Secondary | ICD-10-CM | POA: Diagnosis not present

## 2018-01-05 DIAGNOSIS — G8100 Flaccid hemiplegia affecting unspecified side: Secondary | ICD-10-CM | POA: Diagnosis not present

## 2018-01-05 DIAGNOSIS — R630 Anorexia: Secondary | ICD-10-CM | POA: Diagnosis not present

## 2018-01-05 DIAGNOSIS — I69359 Hemiplegia and hemiparesis following cerebral infarction affecting unspecified side: Secondary | ICD-10-CM | POA: Diagnosis not present

## 2018-01-05 DIAGNOSIS — I6930 Unspecified sequelae of cerebral infarction: Secondary | ICD-10-CM | POA: Diagnosis not present

## 2018-01-05 DIAGNOSIS — F329 Major depressive disorder, single episode, unspecified: Secondary | ICD-10-CM | POA: Diagnosis not present

## 2018-01-05 DIAGNOSIS — G4734 Idiopathic sleep related nonobstructive alveolar hypoventilation: Secondary | ICD-10-CM | POA: Diagnosis not present

## 2018-01-08 DIAGNOSIS — R630 Anorexia: Secondary | ICD-10-CM | POA: Diagnosis not present

## 2018-01-08 DIAGNOSIS — F329 Major depressive disorder, single episode, unspecified: Secondary | ICD-10-CM | POA: Diagnosis not present

## 2018-01-08 DIAGNOSIS — I6930 Unspecified sequelae of cerebral infarction: Secondary | ICD-10-CM | POA: Diagnosis not present

## 2018-01-08 DIAGNOSIS — G4734 Idiopathic sleep related nonobstructive alveolar hypoventilation: Secondary | ICD-10-CM | POA: Diagnosis not present

## 2018-01-08 DIAGNOSIS — G8100 Flaccid hemiplegia affecting unspecified side: Secondary | ICD-10-CM | POA: Diagnosis not present

## 2018-01-08 DIAGNOSIS — I69359 Hemiplegia and hemiparesis following cerebral infarction affecting unspecified side: Secondary | ICD-10-CM | POA: Diagnosis not present

## 2018-01-09 DIAGNOSIS — R630 Anorexia: Secondary | ICD-10-CM | POA: Diagnosis not present

## 2018-01-09 DIAGNOSIS — G4734 Idiopathic sleep related nonobstructive alveolar hypoventilation: Secondary | ICD-10-CM | POA: Diagnosis not present

## 2018-01-09 DIAGNOSIS — I6930 Unspecified sequelae of cerebral infarction: Secondary | ICD-10-CM | POA: Diagnosis not present

## 2018-01-09 DIAGNOSIS — F329 Major depressive disorder, single episode, unspecified: Secondary | ICD-10-CM | POA: Diagnosis not present

## 2018-01-09 DIAGNOSIS — G8100 Flaccid hemiplegia affecting unspecified side: Secondary | ICD-10-CM | POA: Diagnosis not present

## 2018-01-09 DIAGNOSIS — I69359 Hemiplegia and hemiparesis following cerebral infarction affecting unspecified side: Secondary | ICD-10-CM | POA: Diagnosis not present

## 2018-01-10 DIAGNOSIS — I69359 Hemiplegia and hemiparesis following cerebral infarction affecting unspecified side: Secondary | ICD-10-CM | POA: Diagnosis not present

## 2018-01-10 DIAGNOSIS — G8100 Flaccid hemiplegia affecting unspecified side: Secondary | ICD-10-CM | POA: Diagnosis not present

## 2018-01-10 DIAGNOSIS — G4734 Idiopathic sleep related nonobstructive alveolar hypoventilation: Secondary | ICD-10-CM | POA: Diagnosis not present

## 2018-01-10 DIAGNOSIS — F329 Major depressive disorder, single episode, unspecified: Secondary | ICD-10-CM | POA: Diagnosis not present

## 2018-01-10 DIAGNOSIS — I6930 Unspecified sequelae of cerebral infarction: Secondary | ICD-10-CM | POA: Diagnosis not present

## 2018-01-10 DIAGNOSIS — R630 Anorexia: Secondary | ICD-10-CM | POA: Diagnosis not present

## 2018-01-11 DIAGNOSIS — G8100 Flaccid hemiplegia affecting unspecified side: Secondary | ICD-10-CM | POA: Diagnosis not present

## 2018-01-11 DIAGNOSIS — I69359 Hemiplegia and hemiparesis following cerebral infarction affecting unspecified side: Secondary | ICD-10-CM | POA: Diagnosis not present

## 2018-01-11 DIAGNOSIS — I6930 Unspecified sequelae of cerebral infarction: Secondary | ICD-10-CM | POA: Diagnosis not present

## 2018-01-11 DIAGNOSIS — G4734 Idiopathic sleep related nonobstructive alveolar hypoventilation: Secondary | ICD-10-CM | POA: Diagnosis not present

## 2018-01-11 DIAGNOSIS — R630 Anorexia: Secondary | ICD-10-CM | POA: Diagnosis not present

## 2018-01-11 DIAGNOSIS — F329 Major depressive disorder, single episode, unspecified: Secondary | ICD-10-CM | POA: Diagnosis not present

## 2018-01-12 DIAGNOSIS — F329 Major depressive disorder, single episode, unspecified: Secondary | ICD-10-CM | POA: Diagnosis not present

## 2018-01-12 DIAGNOSIS — G8100 Flaccid hemiplegia affecting unspecified side: Secondary | ICD-10-CM | POA: Diagnosis not present

## 2018-01-12 DIAGNOSIS — I6930 Unspecified sequelae of cerebral infarction: Secondary | ICD-10-CM | POA: Diagnosis not present

## 2018-01-12 DIAGNOSIS — I69359 Hemiplegia and hemiparesis following cerebral infarction affecting unspecified side: Secondary | ICD-10-CM | POA: Diagnosis not present

## 2018-01-12 DIAGNOSIS — R630 Anorexia: Secondary | ICD-10-CM | POA: Diagnosis not present

## 2018-01-12 DIAGNOSIS — G4734 Idiopathic sleep related nonobstructive alveolar hypoventilation: Secondary | ICD-10-CM | POA: Diagnosis not present

## 2018-01-15 DIAGNOSIS — F329 Major depressive disorder, single episode, unspecified: Secondary | ICD-10-CM | POA: Diagnosis not present

## 2018-01-15 DIAGNOSIS — I69359 Hemiplegia and hemiparesis following cerebral infarction affecting unspecified side: Secondary | ICD-10-CM | POA: Diagnosis not present

## 2018-01-15 DIAGNOSIS — I6930 Unspecified sequelae of cerebral infarction: Secondary | ICD-10-CM | POA: Diagnosis not present

## 2018-01-15 DIAGNOSIS — R630 Anorexia: Secondary | ICD-10-CM | POA: Diagnosis not present

## 2018-01-15 DIAGNOSIS — G4734 Idiopathic sleep related nonobstructive alveolar hypoventilation: Secondary | ICD-10-CM | POA: Diagnosis not present

## 2018-01-15 DIAGNOSIS — G8100 Flaccid hemiplegia affecting unspecified side: Secondary | ICD-10-CM | POA: Diagnosis not present

## 2018-01-16 DIAGNOSIS — F329 Major depressive disorder, single episode, unspecified: Secondary | ICD-10-CM | POA: Diagnosis not present

## 2018-01-16 DIAGNOSIS — I6930 Unspecified sequelae of cerebral infarction: Secondary | ICD-10-CM | POA: Diagnosis not present

## 2018-01-16 DIAGNOSIS — I69359 Hemiplegia and hemiparesis following cerebral infarction affecting unspecified side: Secondary | ICD-10-CM | POA: Diagnosis not present

## 2018-01-16 DIAGNOSIS — R630 Anorexia: Secondary | ICD-10-CM | POA: Diagnosis not present

## 2018-01-16 DIAGNOSIS — G4734 Idiopathic sleep related nonobstructive alveolar hypoventilation: Secondary | ICD-10-CM | POA: Diagnosis not present

## 2018-01-16 DIAGNOSIS — G8100 Flaccid hemiplegia affecting unspecified side: Secondary | ICD-10-CM | POA: Diagnosis not present

## 2018-01-17 DIAGNOSIS — F329 Major depressive disorder, single episode, unspecified: Secondary | ICD-10-CM | POA: Diagnosis not present

## 2018-01-17 DIAGNOSIS — G8100 Flaccid hemiplegia affecting unspecified side: Secondary | ICD-10-CM | POA: Diagnosis not present

## 2018-01-17 DIAGNOSIS — I6930 Unspecified sequelae of cerebral infarction: Secondary | ICD-10-CM | POA: Diagnosis not present

## 2018-01-17 DIAGNOSIS — R630 Anorexia: Secondary | ICD-10-CM | POA: Diagnosis not present

## 2018-01-17 DIAGNOSIS — G4734 Idiopathic sleep related nonobstructive alveolar hypoventilation: Secondary | ICD-10-CM | POA: Diagnosis not present

## 2018-01-17 DIAGNOSIS — I69359 Hemiplegia and hemiparesis following cerebral infarction affecting unspecified side: Secondary | ICD-10-CM | POA: Diagnosis not present

## 2018-01-18 DIAGNOSIS — I6930 Unspecified sequelae of cerebral infarction: Secondary | ICD-10-CM | POA: Diagnosis not present

## 2018-01-18 DIAGNOSIS — I69359 Hemiplegia and hemiparesis following cerebral infarction affecting unspecified side: Secondary | ICD-10-CM | POA: Diagnosis not present

## 2018-01-18 DIAGNOSIS — G8100 Flaccid hemiplegia affecting unspecified side: Secondary | ICD-10-CM | POA: Diagnosis not present

## 2018-01-18 DIAGNOSIS — F329 Major depressive disorder, single episode, unspecified: Secondary | ICD-10-CM | POA: Diagnosis not present

## 2018-01-18 DIAGNOSIS — R630 Anorexia: Secondary | ICD-10-CM | POA: Diagnosis not present

## 2018-01-18 DIAGNOSIS — G4734 Idiopathic sleep related nonobstructive alveolar hypoventilation: Secondary | ICD-10-CM | POA: Diagnosis not present

## 2018-01-19 DIAGNOSIS — G8100 Flaccid hemiplegia affecting unspecified side: Secondary | ICD-10-CM | POA: Diagnosis not present

## 2018-01-19 DIAGNOSIS — I69359 Hemiplegia and hemiparesis following cerebral infarction affecting unspecified side: Secondary | ICD-10-CM | POA: Diagnosis not present

## 2018-01-19 DIAGNOSIS — G4734 Idiopathic sleep related nonobstructive alveolar hypoventilation: Secondary | ICD-10-CM | POA: Diagnosis not present

## 2018-01-19 DIAGNOSIS — F329 Major depressive disorder, single episode, unspecified: Secondary | ICD-10-CM | POA: Diagnosis not present

## 2018-01-19 DIAGNOSIS — I6930 Unspecified sequelae of cerebral infarction: Secondary | ICD-10-CM | POA: Diagnosis not present

## 2018-01-19 DIAGNOSIS — R630 Anorexia: Secondary | ICD-10-CM | POA: Diagnosis not present

## 2018-01-21 DIAGNOSIS — I6930 Unspecified sequelae of cerebral infarction: Secondary | ICD-10-CM | POA: Diagnosis not present

## 2018-01-21 DIAGNOSIS — F329 Major depressive disorder, single episode, unspecified: Secondary | ICD-10-CM | POA: Diagnosis not present

## 2018-01-21 DIAGNOSIS — G8100 Flaccid hemiplegia affecting unspecified side: Secondary | ICD-10-CM | POA: Diagnosis not present

## 2018-01-21 DIAGNOSIS — G4734 Idiopathic sleep related nonobstructive alveolar hypoventilation: Secondary | ICD-10-CM | POA: Diagnosis not present

## 2018-01-21 DIAGNOSIS — R630 Anorexia: Secondary | ICD-10-CM | POA: Diagnosis not present

## 2018-01-21 DIAGNOSIS — I69359 Hemiplegia and hemiparesis following cerebral infarction affecting unspecified side: Secondary | ICD-10-CM | POA: Diagnosis not present

## 2018-01-22 DIAGNOSIS — R630 Anorexia: Secondary | ICD-10-CM | POA: Diagnosis not present

## 2018-01-22 DIAGNOSIS — I6930 Unspecified sequelae of cerebral infarction: Secondary | ICD-10-CM | POA: Diagnosis not present

## 2018-01-22 DIAGNOSIS — G8100 Flaccid hemiplegia affecting unspecified side: Secondary | ICD-10-CM | POA: Diagnosis not present

## 2018-01-22 DIAGNOSIS — I69359 Hemiplegia and hemiparesis following cerebral infarction affecting unspecified side: Secondary | ICD-10-CM | POA: Diagnosis not present

## 2018-01-22 DIAGNOSIS — G4734 Idiopathic sleep related nonobstructive alveolar hypoventilation: Secondary | ICD-10-CM | POA: Diagnosis not present

## 2018-01-22 DIAGNOSIS — F329 Major depressive disorder, single episode, unspecified: Secondary | ICD-10-CM | POA: Diagnosis not present

## 2018-01-23 DIAGNOSIS — R630 Anorexia: Secondary | ICD-10-CM | POA: Diagnosis not present

## 2018-01-23 DIAGNOSIS — I10 Essential (primary) hypertension: Secondary | ICD-10-CM | POA: Diagnosis not present

## 2018-01-23 DIAGNOSIS — Z9049 Acquired absence of other specified parts of digestive tract: Secondary | ICD-10-CM | POA: Diagnosis not present

## 2018-01-23 DIAGNOSIS — G4734 Idiopathic sleep related nonobstructive alveolar hypoventilation: Secondary | ICD-10-CM | POA: Diagnosis not present

## 2018-01-23 DIAGNOSIS — Z9181 History of falling: Secondary | ICD-10-CM | POA: Diagnosis not present

## 2018-01-23 DIAGNOSIS — I2510 Atherosclerotic heart disease of native coronary artery without angina pectoris: Secondary | ICD-10-CM | POA: Diagnosis not present

## 2018-01-23 DIAGNOSIS — Z931 Gastrostomy status: Secondary | ICD-10-CM | POA: Diagnosis not present

## 2018-01-23 DIAGNOSIS — Z9981 Dependence on supplemental oxygen: Secondary | ICD-10-CM | POA: Diagnosis not present

## 2018-01-23 DIAGNOSIS — I6930 Unspecified sequelae of cerebral infarction: Secondary | ICD-10-CM | POA: Diagnosis not present

## 2018-01-23 DIAGNOSIS — M62838 Other muscle spasm: Secondary | ICD-10-CM | POA: Diagnosis not present

## 2018-01-23 DIAGNOSIS — L89152 Pressure ulcer of sacral region, stage 2: Secondary | ICD-10-CM | POA: Diagnosis not present

## 2018-01-23 DIAGNOSIS — R1310 Dysphagia, unspecified: Secondary | ICD-10-CM | POA: Diagnosis not present

## 2018-01-23 DIAGNOSIS — I69359 Hemiplegia and hemiparesis following cerebral infarction affecting unspecified side: Secondary | ICD-10-CM | POA: Diagnosis not present

## 2018-01-23 DIAGNOSIS — R1312 Dysphagia, oropharyngeal phase: Secondary | ICD-10-CM | POA: Diagnosis not present

## 2018-01-23 DIAGNOSIS — R0902 Hypoxemia: Secondary | ICD-10-CM | POA: Diagnosis not present

## 2018-01-23 DIAGNOSIS — F329 Major depressive disorder, single episode, unspecified: Secondary | ICD-10-CM | POA: Diagnosis not present

## 2018-01-23 DIAGNOSIS — G8100 Flaccid hemiplegia affecting unspecified side: Secondary | ICD-10-CM | POA: Diagnosis not present

## 2018-01-23 DIAGNOSIS — R531 Weakness: Secondary | ICD-10-CM | POA: Diagnosis not present

## 2018-01-24 DIAGNOSIS — F329 Major depressive disorder, single episode, unspecified: Secondary | ICD-10-CM | POA: Diagnosis not present

## 2018-01-24 DIAGNOSIS — G4734 Idiopathic sleep related nonobstructive alveolar hypoventilation: Secondary | ICD-10-CM | POA: Diagnosis not present

## 2018-01-24 DIAGNOSIS — R630 Anorexia: Secondary | ICD-10-CM | POA: Diagnosis not present

## 2018-01-24 DIAGNOSIS — I69359 Hemiplegia and hemiparesis following cerebral infarction affecting unspecified side: Secondary | ICD-10-CM | POA: Diagnosis not present

## 2018-01-24 DIAGNOSIS — I6930 Unspecified sequelae of cerebral infarction: Secondary | ICD-10-CM | POA: Diagnosis not present

## 2018-01-24 DIAGNOSIS — G8100 Flaccid hemiplegia affecting unspecified side: Secondary | ICD-10-CM | POA: Diagnosis not present

## 2018-01-25 DIAGNOSIS — F329 Major depressive disorder, single episode, unspecified: Secondary | ICD-10-CM | POA: Diagnosis not present

## 2018-01-25 DIAGNOSIS — G4734 Idiopathic sleep related nonobstructive alveolar hypoventilation: Secondary | ICD-10-CM | POA: Diagnosis not present

## 2018-01-25 DIAGNOSIS — I6930 Unspecified sequelae of cerebral infarction: Secondary | ICD-10-CM | POA: Diagnosis not present

## 2018-01-25 DIAGNOSIS — I69359 Hemiplegia and hemiparesis following cerebral infarction affecting unspecified side: Secondary | ICD-10-CM | POA: Diagnosis not present

## 2018-01-25 DIAGNOSIS — G8100 Flaccid hemiplegia affecting unspecified side: Secondary | ICD-10-CM | POA: Diagnosis not present

## 2018-01-25 DIAGNOSIS — R630 Anorexia: Secondary | ICD-10-CM | POA: Diagnosis not present

## 2018-01-26 DIAGNOSIS — G4734 Idiopathic sleep related nonobstructive alveolar hypoventilation: Secondary | ICD-10-CM | POA: Diagnosis not present

## 2018-01-26 DIAGNOSIS — I69359 Hemiplegia and hemiparesis following cerebral infarction affecting unspecified side: Secondary | ICD-10-CM | POA: Diagnosis not present

## 2018-01-26 DIAGNOSIS — I6930 Unspecified sequelae of cerebral infarction: Secondary | ICD-10-CM | POA: Diagnosis not present

## 2018-01-26 DIAGNOSIS — R630 Anorexia: Secondary | ICD-10-CM | POA: Diagnosis not present

## 2018-01-26 DIAGNOSIS — G8100 Flaccid hemiplegia affecting unspecified side: Secondary | ICD-10-CM | POA: Diagnosis not present

## 2018-01-26 DIAGNOSIS — F329 Major depressive disorder, single episode, unspecified: Secondary | ICD-10-CM | POA: Diagnosis not present

## 2018-01-28 DIAGNOSIS — I69359 Hemiplegia and hemiparesis following cerebral infarction affecting unspecified side: Secondary | ICD-10-CM | POA: Diagnosis not present

## 2018-01-28 DIAGNOSIS — R630 Anorexia: Secondary | ICD-10-CM | POA: Diagnosis not present

## 2018-01-28 DIAGNOSIS — G8100 Flaccid hemiplegia affecting unspecified side: Secondary | ICD-10-CM | POA: Diagnosis not present

## 2018-01-28 DIAGNOSIS — G4734 Idiopathic sleep related nonobstructive alveolar hypoventilation: Secondary | ICD-10-CM | POA: Diagnosis not present

## 2018-01-28 DIAGNOSIS — F329 Major depressive disorder, single episode, unspecified: Secondary | ICD-10-CM | POA: Diagnosis not present

## 2018-01-28 DIAGNOSIS — I6930 Unspecified sequelae of cerebral infarction: Secondary | ICD-10-CM | POA: Diagnosis not present

## 2018-01-29 DIAGNOSIS — F329 Major depressive disorder, single episode, unspecified: Secondary | ICD-10-CM | POA: Diagnosis not present

## 2018-01-29 DIAGNOSIS — I6930 Unspecified sequelae of cerebral infarction: Secondary | ICD-10-CM | POA: Diagnosis not present

## 2018-01-29 DIAGNOSIS — R630 Anorexia: Secondary | ICD-10-CM | POA: Diagnosis not present

## 2018-01-29 DIAGNOSIS — G8100 Flaccid hemiplegia affecting unspecified side: Secondary | ICD-10-CM | POA: Diagnosis not present

## 2018-01-29 DIAGNOSIS — G4734 Idiopathic sleep related nonobstructive alveolar hypoventilation: Secondary | ICD-10-CM | POA: Diagnosis not present

## 2018-01-29 DIAGNOSIS — I69359 Hemiplegia and hemiparesis following cerebral infarction affecting unspecified side: Secondary | ICD-10-CM | POA: Diagnosis not present

## 2018-01-30 DIAGNOSIS — G8100 Flaccid hemiplegia affecting unspecified side: Secondary | ICD-10-CM | POA: Diagnosis not present

## 2018-01-30 DIAGNOSIS — R630 Anorexia: Secondary | ICD-10-CM | POA: Diagnosis not present

## 2018-01-30 DIAGNOSIS — G4734 Idiopathic sleep related nonobstructive alveolar hypoventilation: Secondary | ICD-10-CM | POA: Diagnosis not present

## 2018-01-30 DIAGNOSIS — F329 Major depressive disorder, single episode, unspecified: Secondary | ICD-10-CM | POA: Diagnosis not present

## 2018-01-30 DIAGNOSIS — I6930 Unspecified sequelae of cerebral infarction: Secondary | ICD-10-CM | POA: Diagnosis not present

## 2018-01-30 DIAGNOSIS — I69359 Hemiplegia and hemiparesis following cerebral infarction affecting unspecified side: Secondary | ICD-10-CM | POA: Diagnosis not present

## 2018-01-31 DIAGNOSIS — G8100 Flaccid hemiplegia affecting unspecified side: Secondary | ICD-10-CM | POA: Diagnosis not present

## 2018-01-31 DIAGNOSIS — I69359 Hemiplegia and hemiparesis following cerebral infarction affecting unspecified side: Secondary | ICD-10-CM | POA: Diagnosis not present

## 2018-01-31 DIAGNOSIS — G4734 Idiopathic sleep related nonobstructive alveolar hypoventilation: Secondary | ICD-10-CM | POA: Diagnosis not present

## 2018-01-31 DIAGNOSIS — I6930 Unspecified sequelae of cerebral infarction: Secondary | ICD-10-CM | POA: Diagnosis not present

## 2018-01-31 DIAGNOSIS — F329 Major depressive disorder, single episode, unspecified: Secondary | ICD-10-CM | POA: Diagnosis not present

## 2018-01-31 DIAGNOSIS — R630 Anorexia: Secondary | ICD-10-CM | POA: Diagnosis not present

## 2018-02-01 DIAGNOSIS — R630 Anorexia: Secondary | ICD-10-CM | POA: Diagnosis not present

## 2018-02-01 DIAGNOSIS — I69359 Hemiplegia and hemiparesis following cerebral infarction affecting unspecified side: Secondary | ICD-10-CM | POA: Diagnosis not present

## 2018-02-01 DIAGNOSIS — G8100 Flaccid hemiplegia affecting unspecified side: Secondary | ICD-10-CM | POA: Diagnosis not present

## 2018-02-01 DIAGNOSIS — G4734 Idiopathic sleep related nonobstructive alveolar hypoventilation: Secondary | ICD-10-CM | POA: Diagnosis not present

## 2018-02-01 DIAGNOSIS — F329 Major depressive disorder, single episode, unspecified: Secondary | ICD-10-CM | POA: Diagnosis not present

## 2018-02-01 DIAGNOSIS — I6930 Unspecified sequelae of cerebral infarction: Secondary | ICD-10-CM | POA: Diagnosis not present

## 2018-02-02 DIAGNOSIS — G8100 Flaccid hemiplegia affecting unspecified side: Secondary | ICD-10-CM | POA: Diagnosis not present

## 2018-02-02 DIAGNOSIS — G4734 Idiopathic sleep related nonobstructive alveolar hypoventilation: Secondary | ICD-10-CM | POA: Diagnosis not present

## 2018-02-02 DIAGNOSIS — R630 Anorexia: Secondary | ICD-10-CM | POA: Diagnosis not present

## 2018-02-02 DIAGNOSIS — F329 Major depressive disorder, single episode, unspecified: Secondary | ICD-10-CM | POA: Diagnosis not present

## 2018-02-02 DIAGNOSIS — I69359 Hemiplegia and hemiparesis following cerebral infarction affecting unspecified side: Secondary | ICD-10-CM | POA: Diagnosis not present

## 2018-02-02 DIAGNOSIS — I6930 Unspecified sequelae of cerebral infarction: Secondary | ICD-10-CM | POA: Diagnosis not present

## 2018-02-06 DIAGNOSIS — I6930 Unspecified sequelae of cerebral infarction: Secondary | ICD-10-CM | POA: Diagnosis not present

## 2018-02-06 DIAGNOSIS — G8100 Flaccid hemiplegia affecting unspecified side: Secondary | ICD-10-CM | POA: Diagnosis not present

## 2018-02-06 DIAGNOSIS — I69359 Hemiplegia and hemiparesis following cerebral infarction affecting unspecified side: Secondary | ICD-10-CM | POA: Diagnosis not present

## 2018-02-06 DIAGNOSIS — G4734 Idiopathic sleep related nonobstructive alveolar hypoventilation: Secondary | ICD-10-CM | POA: Diagnosis not present

## 2018-02-06 DIAGNOSIS — R630 Anorexia: Secondary | ICD-10-CM | POA: Diagnosis not present

## 2018-02-06 DIAGNOSIS — F329 Major depressive disorder, single episode, unspecified: Secondary | ICD-10-CM | POA: Diagnosis not present

## 2018-02-07 DIAGNOSIS — F329 Major depressive disorder, single episode, unspecified: Secondary | ICD-10-CM | POA: Diagnosis not present

## 2018-02-07 DIAGNOSIS — I69359 Hemiplegia and hemiparesis following cerebral infarction affecting unspecified side: Secondary | ICD-10-CM | POA: Diagnosis not present

## 2018-02-07 DIAGNOSIS — I6930 Unspecified sequelae of cerebral infarction: Secondary | ICD-10-CM | POA: Diagnosis not present

## 2018-02-07 DIAGNOSIS — R630 Anorexia: Secondary | ICD-10-CM | POA: Diagnosis not present

## 2018-02-07 DIAGNOSIS — G4734 Idiopathic sleep related nonobstructive alveolar hypoventilation: Secondary | ICD-10-CM | POA: Diagnosis not present

## 2018-02-07 DIAGNOSIS — G8100 Flaccid hemiplegia affecting unspecified side: Secondary | ICD-10-CM | POA: Diagnosis not present

## 2018-02-08 DIAGNOSIS — F329 Major depressive disorder, single episode, unspecified: Secondary | ICD-10-CM | POA: Diagnosis not present

## 2018-02-08 DIAGNOSIS — R630 Anorexia: Secondary | ICD-10-CM | POA: Diagnosis not present

## 2018-02-08 DIAGNOSIS — G4734 Idiopathic sleep related nonobstructive alveolar hypoventilation: Secondary | ICD-10-CM | POA: Diagnosis not present

## 2018-02-08 DIAGNOSIS — I6930 Unspecified sequelae of cerebral infarction: Secondary | ICD-10-CM | POA: Diagnosis not present

## 2018-02-08 DIAGNOSIS — G8100 Flaccid hemiplegia affecting unspecified side: Secondary | ICD-10-CM | POA: Diagnosis not present

## 2018-02-08 DIAGNOSIS — I69359 Hemiplegia and hemiparesis following cerebral infarction affecting unspecified side: Secondary | ICD-10-CM | POA: Diagnosis not present

## 2018-02-09 DIAGNOSIS — R630 Anorexia: Secondary | ICD-10-CM | POA: Diagnosis not present

## 2018-02-09 DIAGNOSIS — G8100 Flaccid hemiplegia affecting unspecified side: Secondary | ICD-10-CM | POA: Diagnosis not present

## 2018-02-09 DIAGNOSIS — I69359 Hemiplegia and hemiparesis following cerebral infarction affecting unspecified side: Secondary | ICD-10-CM | POA: Diagnosis not present

## 2018-02-09 DIAGNOSIS — F329 Major depressive disorder, single episode, unspecified: Secondary | ICD-10-CM | POA: Diagnosis not present

## 2018-02-09 DIAGNOSIS — G4734 Idiopathic sleep related nonobstructive alveolar hypoventilation: Secondary | ICD-10-CM | POA: Diagnosis not present

## 2018-02-09 DIAGNOSIS — I6930 Unspecified sequelae of cerebral infarction: Secondary | ICD-10-CM | POA: Diagnosis not present

## 2018-02-10 DIAGNOSIS — G8100 Flaccid hemiplegia affecting unspecified side: Secondary | ICD-10-CM | POA: Diagnosis not present

## 2018-02-10 DIAGNOSIS — I69359 Hemiplegia and hemiparesis following cerebral infarction affecting unspecified side: Secondary | ICD-10-CM | POA: Diagnosis not present

## 2018-02-10 DIAGNOSIS — F329 Major depressive disorder, single episode, unspecified: Secondary | ICD-10-CM | POA: Diagnosis not present

## 2018-02-10 DIAGNOSIS — I6930 Unspecified sequelae of cerebral infarction: Secondary | ICD-10-CM | POA: Diagnosis not present

## 2018-02-10 DIAGNOSIS — G4734 Idiopathic sleep related nonobstructive alveolar hypoventilation: Secondary | ICD-10-CM | POA: Diagnosis not present

## 2018-02-10 DIAGNOSIS — R630 Anorexia: Secondary | ICD-10-CM | POA: Diagnosis not present

## 2018-02-12 DIAGNOSIS — G4734 Idiopathic sleep related nonobstructive alveolar hypoventilation: Secondary | ICD-10-CM | POA: Diagnosis not present

## 2018-02-12 DIAGNOSIS — F329 Major depressive disorder, single episode, unspecified: Secondary | ICD-10-CM | POA: Diagnosis not present

## 2018-02-12 DIAGNOSIS — I69359 Hemiplegia and hemiparesis following cerebral infarction affecting unspecified side: Secondary | ICD-10-CM | POA: Diagnosis not present

## 2018-02-12 DIAGNOSIS — I6930 Unspecified sequelae of cerebral infarction: Secondary | ICD-10-CM | POA: Diagnosis not present

## 2018-02-12 DIAGNOSIS — R630 Anorexia: Secondary | ICD-10-CM | POA: Diagnosis not present

## 2018-02-12 DIAGNOSIS — G8100 Flaccid hemiplegia affecting unspecified side: Secondary | ICD-10-CM | POA: Diagnosis not present

## 2018-02-13 DIAGNOSIS — I6930 Unspecified sequelae of cerebral infarction: Secondary | ICD-10-CM | POA: Diagnosis not present

## 2018-02-13 DIAGNOSIS — G4734 Idiopathic sleep related nonobstructive alveolar hypoventilation: Secondary | ICD-10-CM | POA: Diagnosis not present

## 2018-02-13 DIAGNOSIS — R630 Anorexia: Secondary | ICD-10-CM | POA: Diagnosis not present

## 2018-02-13 DIAGNOSIS — F329 Major depressive disorder, single episode, unspecified: Secondary | ICD-10-CM | POA: Diagnosis not present

## 2018-02-13 DIAGNOSIS — G8100 Flaccid hemiplegia affecting unspecified side: Secondary | ICD-10-CM | POA: Diagnosis not present

## 2018-02-13 DIAGNOSIS — I69359 Hemiplegia and hemiparesis following cerebral infarction affecting unspecified side: Secondary | ICD-10-CM | POA: Diagnosis not present

## 2018-02-14 DIAGNOSIS — I6930 Unspecified sequelae of cerebral infarction: Secondary | ICD-10-CM | POA: Diagnosis not present

## 2018-02-14 DIAGNOSIS — G8100 Flaccid hemiplegia affecting unspecified side: Secondary | ICD-10-CM | POA: Diagnosis not present

## 2018-02-14 DIAGNOSIS — F329 Major depressive disorder, single episode, unspecified: Secondary | ICD-10-CM | POA: Diagnosis not present

## 2018-02-14 DIAGNOSIS — G4734 Idiopathic sleep related nonobstructive alveolar hypoventilation: Secondary | ICD-10-CM | POA: Diagnosis not present

## 2018-02-14 DIAGNOSIS — R630 Anorexia: Secondary | ICD-10-CM | POA: Diagnosis not present

## 2018-02-14 DIAGNOSIS — I69359 Hemiplegia and hemiparesis following cerebral infarction affecting unspecified side: Secondary | ICD-10-CM | POA: Diagnosis not present

## 2018-02-15 DIAGNOSIS — I6930 Unspecified sequelae of cerebral infarction: Secondary | ICD-10-CM | POA: Diagnosis not present

## 2018-02-15 DIAGNOSIS — G8100 Flaccid hemiplegia affecting unspecified side: Secondary | ICD-10-CM | POA: Diagnosis not present

## 2018-02-15 DIAGNOSIS — I69359 Hemiplegia and hemiparesis following cerebral infarction affecting unspecified side: Secondary | ICD-10-CM | POA: Diagnosis not present

## 2018-02-15 DIAGNOSIS — G4734 Idiopathic sleep related nonobstructive alveolar hypoventilation: Secondary | ICD-10-CM | POA: Diagnosis not present

## 2018-02-15 DIAGNOSIS — R630 Anorexia: Secondary | ICD-10-CM | POA: Diagnosis not present

## 2018-02-15 DIAGNOSIS — F329 Major depressive disorder, single episode, unspecified: Secondary | ICD-10-CM | POA: Diagnosis not present

## 2018-02-16 DIAGNOSIS — I6930 Unspecified sequelae of cerebral infarction: Secondary | ICD-10-CM | POA: Diagnosis not present

## 2018-02-16 DIAGNOSIS — G8100 Flaccid hemiplegia affecting unspecified side: Secondary | ICD-10-CM | POA: Diagnosis not present

## 2018-02-16 DIAGNOSIS — I69359 Hemiplegia and hemiparesis following cerebral infarction affecting unspecified side: Secondary | ICD-10-CM | POA: Diagnosis not present

## 2018-02-16 DIAGNOSIS — F329 Major depressive disorder, single episode, unspecified: Secondary | ICD-10-CM | POA: Diagnosis not present

## 2018-02-16 DIAGNOSIS — R630 Anorexia: Secondary | ICD-10-CM | POA: Diagnosis not present

## 2018-02-16 DIAGNOSIS — G4734 Idiopathic sleep related nonobstructive alveolar hypoventilation: Secondary | ICD-10-CM | POA: Diagnosis not present

## 2018-02-19 DIAGNOSIS — G8100 Flaccid hemiplegia affecting unspecified side: Secondary | ICD-10-CM | POA: Diagnosis not present

## 2018-02-19 DIAGNOSIS — F329 Major depressive disorder, single episode, unspecified: Secondary | ICD-10-CM | POA: Diagnosis not present

## 2018-02-19 DIAGNOSIS — I69359 Hemiplegia and hemiparesis following cerebral infarction affecting unspecified side: Secondary | ICD-10-CM | POA: Diagnosis not present

## 2018-02-19 DIAGNOSIS — R630 Anorexia: Secondary | ICD-10-CM | POA: Diagnosis not present

## 2018-02-19 DIAGNOSIS — G4734 Idiopathic sleep related nonobstructive alveolar hypoventilation: Secondary | ICD-10-CM | POA: Diagnosis not present

## 2018-02-19 DIAGNOSIS — I6930 Unspecified sequelae of cerebral infarction: Secondary | ICD-10-CM | POA: Diagnosis not present

## 2018-02-20 DIAGNOSIS — G4734 Idiopathic sleep related nonobstructive alveolar hypoventilation: Secondary | ICD-10-CM | POA: Diagnosis not present

## 2018-02-20 DIAGNOSIS — R630 Anorexia: Secondary | ICD-10-CM | POA: Diagnosis not present

## 2018-02-20 DIAGNOSIS — G8100 Flaccid hemiplegia affecting unspecified side: Secondary | ICD-10-CM | POA: Diagnosis not present

## 2018-02-20 DIAGNOSIS — F329 Major depressive disorder, single episode, unspecified: Secondary | ICD-10-CM | POA: Diagnosis not present

## 2018-02-20 DIAGNOSIS — I6930 Unspecified sequelae of cerebral infarction: Secondary | ICD-10-CM | POA: Diagnosis not present

## 2018-02-20 DIAGNOSIS — I69359 Hemiplegia and hemiparesis following cerebral infarction affecting unspecified side: Secondary | ICD-10-CM | POA: Diagnosis not present

## 2018-02-21 DIAGNOSIS — G8100 Flaccid hemiplegia affecting unspecified side: Secondary | ICD-10-CM | POA: Diagnosis not present

## 2018-02-21 DIAGNOSIS — R630 Anorexia: Secondary | ICD-10-CM | POA: Diagnosis not present

## 2018-02-21 DIAGNOSIS — I6930 Unspecified sequelae of cerebral infarction: Secondary | ICD-10-CM | POA: Diagnosis not present

## 2018-02-21 DIAGNOSIS — I69359 Hemiplegia and hemiparesis following cerebral infarction affecting unspecified side: Secondary | ICD-10-CM | POA: Diagnosis not present

## 2018-02-21 DIAGNOSIS — F329 Major depressive disorder, single episode, unspecified: Secondary | ICD-10-CM | POA: Diagnosis not present

## 2018-02-21 DIAGNOSIS — G4734 Idiopathic sleep related nonobstructive alveolar hypoventilation: Secondary | ICD-10-CM | POA: Diagnosis not present

## 2018-02-22 DIAGNOSIS — G8100 Flaccid hemiplegia affecting unspecified side: Secondary | ICD-10-CM | POA: Diagnosis not present

## 2018-02-22 DIAGNOSIS — I69359 Hemiplegia and hemiparesis following cerebral infarction affecting unspecified side: Secondary | ICD-10-CM | POA: Diagnosis not present

## 2018-02-22 DIAGNOSIS — G4734 Idiopathic sleep related nonobstructive alveolar hypoventilation: Secondary | ICD-10-CM | POA: Diagnosis not present

## 2018-02-22 DIAGNOSIS — F329 Major depressive disorder, single episode, unspecified: Secondary | ICD-10-CM | POA: Diagnosis not present

## 2018-02-22 DIAGNOSIS — R630 Anorexia: Secondary | ICD-10-CM | POA: Diagnosis not present

## 2018-02-22 DIAGNOSIS — I6930 Unspecified sequelae of cerebral infarction: Secondary | ICD-10-CM | POA: Diagnosis not present

## 2018-02-23 DIAGNOSIS — R630 Anorexia: Secondary | ICD-10-CM | POA: Diagnosis not present

## 2018-02-23 DIAGNOSIS — Z9981 Dependence on supplemental oxygen: Secondary | ICD-10-CM | POA: Diagnosis not present

## 2018-02-23 DIAGNOSIS — Z931 Gastrostomy status: Secondary | ICD-10-CM | POA: Diagnosis not present

## 2018-02-23 DIAGNOSIS — Z9049 Acquired absence of other specified parts of digestive tract: Secondary | ICD-10-CM | POA: Diagnosis not present

## 2018-02-23 DIAGNOSIS — I6930 Unspecified sequelae of cerebral infarction: Secondary | ICD-10-CM | POA: Diagnosis not present

## 2018-02-23 DIAGNOSIS — G8100 Flaccid hemiplegia affecting unspecified side: Secondary | ICD-10-CM | POA: Diagnosis not present

## 2018-02-23 DIAGNOSIS — M62838 Other muscle spasm: Secondary | ICD-10-CM | POA: Diagnosis not present

## 2018-02-23 DIAGNOSIS — R1310 Dysphagia, unspecified: Secondary | ICD-10-CM | POA: Diagnosis not present

## 2018-02-23 DIAGNOSIS — I10 Essential (primary) hypertension: Secondary | ICD-10-CM | POA: Diagnosis not present

## 2018-02-23 DIAGNOSIS — I69359 Hemiplegia and hemiparesis following cerebral infarction affecting unspecified side: Secondary | ICD-10-CM | POA: Diagnosis not present

## 2018-02-23 DIAGNOSIS — G4734 Idiopathic sleep related nonobstructive alveolar hypoventilation: Secondary | ICD-10-CM | POA: Diagnosis not present

## 2018-02-23 DIAGNOSIS — R0902 Hypoxemia: Secondary | ICD-10-CM | POA: Diagnosis not present

## 2018-02-23 DIAGNOSIS — Z9181 History of falling: Secondary | ICD-10-CM | POA: Diagnosis not present

## 2018-02-23 DIAGNOSIS — L89152 Pressure ulcer of sacral region, stage 2: Secondary | ICD-10-CM | POA: Diagnosis not present

## 2018-02-23 DIAGNOSIS — F329 Major depressive disorder, single episode, unspecified: Secondary | ICD-10-CM | POA: Diagnosis not present

## 2018-02-23 DIAGNOSIS — R531 Weakness: Secondary | ICD-10-CM | POA: Diagnosis not present

## 2018-02-23 DIAGNOSIS — I2510 Atherosclerotic heart disease of native coronary artery without angina pectoris: Secondary | ICD-10-CM | POA: Diagnosis not present

## 2018-02-23 DIAGNOSIS — R1312 Dysphagia, oropharyngeal phase: Secondary | ICD-10-CM | POA: Diagnosis not present

## 2018-02-26 DIAGNOSIS — I6930 Unspecified sequelae of cerebral infarction: Secondary | ICD-10-CM | POA: Diagnosis not present

## 2018-02-26 DIAGNOSIS — R630 Anorexia: Secondary | ICD-10-CM | POA: Diagnosis not present

## 2018-02-26 DIAGNOSIS — I69359 Hemiplegia and hemiparesis following cerebral infarction affecting unspecified side: Secondary | ICD-10-CM | POA: Diagnosis not present

## 2018-02-26 DIAGNOSIS — G4734 Idiopathic sleep related nonobstructive alveolar hypoventilation: Secondary | ICD-10-CM | POA: Diagnosis not present

## 2018-02-26 DIAGNOSIS — F329 Major depressive disorder, single episode, unspecified: Secondary | ICD-10-CM | POA: Diagnosis not present

## 2018-02-26 DIAGNOSIS — G8100 Flaccid hemiplegia affecting unspecified side: Secondary | ICD-10-CM | POA: Diagnosis not present

## 2018-02-27 DIAGNOSIS — G8100 Flaccid hemiplegia affecting unspecified side: Secondary | ICD-10-CM | POA: Diagnosis not present

## 2018-02-27 DIAGNOSIS — G4734 Idiopathic sleep related nonobstructive alveolar hypoventilation: Secondary | ICD-10-CM | POA: Diagnosis not present

## 2018-02-27 DIAGNOSIS — I6930 Unspecified sequelae of cerebral infarction: Secondary | ICD-10-CM | POA: Diagnosis not present

## 2018-02-27 DIAGNOSIS — F329 Major depressive disorder, single episode, unspecified: Secondary | ICD-10-CM | POA: Diagnosis not present

## 2018-02-27 DIAGNOSIS — R630 Anorexia: Secondary | ICD-10-CM | POA: Diagnosis not present

## 2018-02-27 DIAGNOSIS — I69359 Hemiplegia and hemiparesis following cerebral infarction affecting unspecified side: Secondary | ICD-10-CM | POA: Diagnosis not present

## 2018-02-28 DIAGNOSIS — G4734 Idiopathic sleep related nonobstructive alveolar hypoventilation: Secondary | ICD-10-CM | POA: Diagnosis not present

## 2018-02-28 DIAGNOSIS — G8100 Flaccid hemiplegia affecting unspecified side: Secondary | ICD-10-CM | POA: Diagnosis not present

## 2018-02-28 DIAGNOSIS — I6930 Unspecified sequelae of cerebral infarction: Secondary | ICD-10-CM | POA: Diagnosis not present

## 2018-02-28 DIAGNOSIS — F329 Major depressive disorder, single episode, unspecified: Secondary | ICD-10-CM | POA: Diagnosis not present

## 2018-02-28 DIAGNOSIS — I69359 Hemiplegia and hemiparesis following cerebral infarction affecting unspecified side: Secondary | ICD-10-CM | POA: Diagnosis not present

## 2018-02-28 DIAGNOSIS — R630 Anorexia: Secondary | ICD-10-CM | POA: Diagnosis not present

## 2018-03-01 DIAGNOSIS — I6930 Unspecified sequelae of cerebral infarction: Secondary | ICD-10-CM | POA: Diagnosis not present

## 2018-03-01 DIAGNOSIS — I69359 Hemiplegia and hemiparesis following cerebral infarction affecting unspecified side: Secondary | ICD-10-CM | POA: Diagnosis not present

## 2018-03-01 DIAGNOSIS — G4734 Idiopathic sleep related nonobstructive alveolar hypoventilation: Secondary | ICD-10-CM | POA: Diagnosis not present

## 2018-03-01 DIAGNOSIS — F329 Major depressive disorder, single episode, unspecified: Secondary | ICD-10-CM | POA: Diagnosis not present

## 2018-03-01 DIAGNOSIS — R630 Anorexia: Secondary | ICD-10-CM | POA: Diagnosis not present

## 2018-03-01 DIAGNOSIS — G8100 Flaccid hemiplegia affecting unspecified side: Secondary | ICD-10-CM | POA: Diagnosis not present

## 2018-03-02 DIAGNOSIS — R630 Anorexia: Secondary | ICD-10-CM | POA: Diagnosis not present

## 2018-03-02 DIAGNOSIS — G8100 Flaccid hemiplegia affecting unspecified side: Secondary | ICD-10-CM | POA: Diagnosis not present

## 2018-03-02 DIAGNOSIS — G4734 Idiopathic sleep related nonobstructive alveolar hypoventilation: Secondary | ICD-10-CM | POA: Diagnosis not present

## 2018-03-02 DIAGNOSIS — I69359 Hemiplegia and hemiparesis following cerebral infarction affecting unspecified side: Secondary | ICD-10-CM | POA: Diagnosis not present

## 2018-03-02 DIAGNOSIS — F329 Major depressive disorder, single episode, unspecified: Secondary | ICD-10-CM | POA: Diagnosis not present

## 2018-03-02 DIAGNOSIS — I6930 Unspecified sequelae of cerebral infarction: Secondary | ICD-10-CM | POA: Diagnosis not present

## 2018-03-05 DIAGNOSIS — G4734 Idiopathic sleep related nonobstructive alveolar hypoventilation: Secondary | ICD-10-CM | POA: Diagnosis not present

## 2018-03-05 DIAGNOSIS — G8100 Flaccid hemiplegia affecting unspecified side: Secondary | ICD-10-CM | POA: Diagnosis not present

## 2018-03-05 DIAGNOSIS — I69359 Hemiplegia and hemiparesis following cerebral infarction affecting unspecified side: Secondary | ICD-10-CM | POA: Diagnosis not present

## 2018-03-05 DIAGNOSIS — R630 Anorexia: Secondary | ICD-10-CM | POA: Diagnosis not present

## 2018-03-05 DIAGNOSIS — F329 Major depressive disorder, single episode, unspecified: Secondary | ICD-10-CM | POA: Diagnosis not present

## 2018-03-05 DIAGNOSIS — I6930 Unspecified sequelae of cerebral infarction: Secondary | ICD-10-CM | POA: Diagnosis not present

## 2018-03-06 DIAGNOSIS — G8100 Flaccid hemiplegia affecting unspecified side: Secondary | ICD-10-CM | POA: Diagnosis not present

## 2018-03-06 DIAGNOSIS — R630 Anorexia: Secondary | ICD-10-CM | POA: Diagnosis not present

## 2018-03-06 DIAGNOSIS — G4734 Idiopathic sleep related nonobstructive alveolar hypoventilation: Secondary | ICD-10-CM | POA: Diagnosis not present

## 2018-03-06 DIAGNOSIS — I69359 Hemiplegia and hemiparesis following cerebral infarction affecting unspecified side: Secondary | ICD-10-CM | POA: Diagnosis not present

## 2018-03-06 DIAGNOSIS — I6930 Unspecified sequelae of cerebral infarction: Secondary | ICD-10-CM | POA: Diagnosis not present

## 2018-03-06 DIAGNOSIS — F329 Major depressive disorder, single episode, unspecified: Secondary | ICD-10-CM | POA: Diagnosis not present

## 2018-03-07 DIAGNOSIS — G4734 Idiopathic sleep related nonobstructive alveolar hypoventilation: Secondary | ICD-10-CM | POA: Diagnosis not present

## 2018-03-07 DIAGNOSIS — G8100 Flaccid hemiplegia affecting unspecified side: Secondary | ICD-10-CM | POA: Diagnosis not present

## 2018-03-07 DIAGNOSIS — F329 Major depressive disorder, single episode, unspecified: Secondary | ICD-10-CM | POA: Diagnosis not present

## 2018-03-07 DIAGNOSIS — I69359 Hemiplegia and hemiparesis following cerebral infarction affecting unspecified side: Secondary | ICD-10-CM | POA: Diagnosis not present

## 2018-03-07 DIAGNOSIS — R630 Anorexia: Secondary | ICD-10-CM | POA: Diagnosis not present

## 2018-03-07 DIAGNOSIS — I6930 Unspecified sequelae of cerebral infarction: Secondary | ICD-10-CM | POA: Diagnosis not present

## 2018-03-08 DIAGNOSIS — I6930 Unspecified sequelae of cerebral infarction: Secondary | ICD-10-CM | POA: Diagnosis not present

## 2018-03-08 DIAGNOSIS — I69359 Hemiplegia and hemiparesis following cerebral infarction affecting unspecified side: Secondary | ICD-10-CM | POA: Diagnosis not present

## 2018-03-08 DIAGNOSIS — R630 Anorexia: Secondary | ICD-10-CM | POA: Diagnosis not present

## 2018-03-08 DIAGNOSIS — G4734 Idiopathic sleep related nonobstructive alveolar hypoventilation: Secondary | ICD-10-CM | POA: Diagnosis not present

## 2018-03-08 DIAGNOSIS — F329 Major depressive disorder, single episode, unspecified: Secondary | ICD-10-CM | POA: Diagnosis not present

## 2018-03-08 DIAGNOSIS — G8100 Flaccid hemiplegia affecting unspecified side: Secondary | ICD-10-CM | POA: Diagnosis not present

## 2018-03-09 DIAGNOSIS — Z23 Encounter for immunization: Secondary | ICD-10-CM | POA: Diagnosis not present

## 2018-03-12 DIAGNOSIS — I69359 Hemiplegia and hemiparesis following cerebral infarction affecting unspecified side: Secondary | ICD-10-CM | POA: Diagnosis not present

## 2018-03-12 DIAGNOSIS — I6930 Unspecified sequelae of cerebral infarction: Secondary | ICD-10-CM | POA: Diagnosis not present

## 2018-03-12 DIAGNOSIS — G4734 Idiopathic sleep related nonobstructive alveolar hypoventilation: Secondary | ICD-10-CM | POA: Diagnosis not present

## 2018-03-12 DIAGNOSIS — R630 Anorexia: Secondary | ICD-10-CM | POA: Diagnosis not present

## 2018-03-12 DIAGNOSIS — F329 Major depressive disorder, single episode, unspecified: Secondary | ICD-10-CM | POA: Diagnosis not present

## 2018-03-12 DIAGNOSIS — G8100 Flaccid hemiplegia affecting unspecified side: Secondary | ICD-10-CM | POA: Diagnosis not present

## 2018-03-13 DIAGNOSIS — F329 Major depressive disorder, single episode, unspecified: Secondary | ICD-10-CM | POA: Diagnosis not present

## 2018-03-13 DIAGNOSIS — I69359 Hemiplegia and hemiparesis following cerebral infarction affecting unspecified side: Secondary | ICD-10-CM | POA: Diagnosis not present

## 2018-03-13 DIAGNOSIS — R630 Anorexia: Secondary | ICD-10-CM | POA: Diagnosis not present

## 2018-03-13 DIAGNOSIS — G4734 Idiopathic sleep related nonobstructive alveolar hypoventilation: Secondary | ICD-10-CM | POA: Diagnosis not present

## 2018-03-13 DIAGNOSIS — I6930 Unspecified sequelae of cerebral infarction: Secondary | ICD-10-CM | POA: Diagnosis not present

## 2018-03-13 DIAGNOSIS — G8100 Flaccid hemiplegia affecting unspecified side: Secondary | ICD-10-CM | POA: Diagnosis not present

## 2018-03-14 DIAGNOSIS — R630 Anorexia: Secondary | ICD-10-CM | POA: Diagnosis not present

## 2018-03-14 DIAGNOSIS — F329 Major depressive disorder, single episode, unspecified: Secondary | ICD-10-CM | POA: Diagnosis not present

## 2018-03-14 DIAGNOSIS — G8100 Flaccid hemiplegia affecting unspecified side: Secondary | ICD-10-CM | POA: Diagnosis not present

## 2018-03-14 DIAGNOSIS — I69359 Hemiplegia and hemiparesis following cerebral infarction affecting unspecified side: Secondary | ICD-10-CM | POA: Diagnosis not present

## 2018-03-14 DIAGNOSIS — G4734 Idiopathic sleep related nonobstructive alveolar hypoventilation: Secondary | ICD-10-CM | POA: Diagnosis not present

## 2018-03-14 DIAGNOSIS — I6930 Unspecified sequelae of cerebral infarction: Secondary | ICD-10-CM | POA: Diagnosis not present

## 2018-03-15 DIAGNOSIS — R630 Anorexia: Secondary | ICD-10-CM | POA: Diagnosis not present

## 2018-03-15 DIAGNOSIS — I6930 Unspecified sequelae of cerebral infarction: Secondary | ICD-10-CM | POA: Diagnosis not present

## 2018-03-15 DIAGNOSIS — I69359 Hemiplegia and hemiparesis following cerebral infarction affecting unspecified side: Secondary | ICD-10-CM | POA: Diagnosis not present

## 2018-03-15 DIAGNOSIS — G4734 Idiopathic sleep related nonobstructive alveolar hypoventilation: Secondary | ICD-10-CM | POA: Diagnosis not present

## 2018-03-15 DIAGNOSIS — F329 Major depressive disorder, single episode, unspecified: Secondary | ICD-10-CM | POA: Diagnosis not present

## 2018-03-15 DIAGNOSIS — G8100 Flaccid hemiplegia affecting unspecified side: Secondary | ICD-10-CM | POA: Diagnosis not present

## 2018-03-16 DIAGNOSIS — I69359 Hemiplegia and hemiparesis following cerebral infarction affecting unspecified side: Secondary | ICD-10-CM | POA: Diagnosis not present

## 2018-03-16 DIAGNOSIS — I6930 Unspecified sequelae of cerebral infarction: Secondary | ICD-10-CM | POA: Diagnosis not present

## 2018-03-16 DIAGNOSIS — R630 Anorexia: Secondary | ICD-10-CM | POA: Diagnosis not present

## 2018-03-16 DIAGNOSIS — F329 Major depressive disorder, single episode, unspecified: Secondary | ICD-10-CM | POA: Diagnosis not present

## 2018-03-16 DIAGNOSIS — G4734 Idiopathic sleep related nonobstructive alveolar hypoventilation: Secondary | ICD-10-CM | POA: Diagnosis not present

## 2018-03-16 DIAGNOSIS — G8100 Flaccid hemiplegia affecting unspecified side: Secondary | ICD-10-CM | POA: Diagnosis not present

## 2018-03-19 DIAGNOSIS — R630 Anorexia: Secondary | ICD-10-CM | POA: Diagnosis not present

## 2018-03-19 DIAGNOSIS — F329 Major depressive disorder, single episode, unspecified: Secondary | ICD-10-CM | POA: Diagnosis not present

## 2018-03-19 DIAGNOSIS — G4734 Idiopathic sleep related nonobstructive alveolar hypoventilation: Secondary | ICD-10-CM | POA: Diagnosis not present

## 2018-03-19 DIAGNOSIS — I6930 Unspecified sequelae of cerebral infarction: Secondary | ICD-10-CM | POA: Diagnosis not present

## 2018-03-19 DIAGNOSIS — I69359 Hemiplegia and hemiparesis following cerebral infarction affecting unspecified side: Secondary | ICD-10-CM | POA: Diagnosis not present

## 2018-03-19 DIAGNOSIS — G8100 Flaccid hemiplegia affecting unspecified side: Secondary | ICD-10-CM | POA: Diagnosis not present

## 2018-03-20 DIAGNOSIS — F329 Major depressive disorder, single episode, unspecified: Secondary | ICD-10-CM | POA: Diagnosis not present

## 2018-03-20 DIAGNOSIS — R630 Anorexia: Secondary | ICD-10-CM | POA: Diagnosis not present

## 2018-03-20 DIAGNOSIS — G8100 Flaccid hemiplegia affecting unspecified side: Secondary | ICD-10-CM | POA: Diagnosis not present

## 2018-03-20 DIAGNOSIS — G4734 Idiopathic sleep related nonobstructive alveolar hypoventilation: Secondary | ICD-10-CM | POA: Diagnosis not present

## 2018-03-20 DIAGNOSIS — I6930 Unspecified sequelae of cerebral infarction: Secondary | ICD-10-CM | POA: Diagnosis not present

## 2018-03-20 DIAGNOSIS — I69359 Hemiplegia and hemiparesis following cerebral infarction affecting unspecified side: Secondary | ICD-10-CM | POA: Diagnosis not present

## 2018-03-21 DIAGNOSIS — G8100 Flaccid hemiplegia affecting unspecified side: Secondary | ICD-10-CM | POA: Diagnosis not present

## 2018-03-21 DIAGNOSIS — R630 Anorexia: Secondary | ICD-10-CM | POA: Diagnosis not present

## 2018-03-21 DIAGNOSIS — I69359 Hemiplegia and hemiparesis following cerebral infarction affecting unspecified side: Secondary | ICD-10-CM | POA: Diagnosis not present

## 2018-03-21 DIAGNOSIS — I6930 Unspecified sequelae of cerebral infarction: Secondary | ICD-10-CM | POA: Diagnosis not present

## 2018-03-21 DIAGNOSIS — G4734 Idiopathic sleep related nonobstructive alveolar hypoventilation: Secondary | ICD-10-CM | POA: Diagnosis not present

## 2018-03-21 DIAGNOSIS — F329 Major depressive disorder, single episode, unspecified: Secondary | ICD-10-CM | POA: Diagnosis not present

## 2018-03-25 DIAGNOSIS — R1310 Dysphagia, unspecified: Secondary | ICD-10-CM | POA: Diagnosis not present

## 2018-03-25 DIAGNOSIS — R531 Weakness: Secondary | ICD-10-CM | POA: Diagnosis not present

## 2018-03-25 DIAGNOSIS — G8100 Flaccid hemiplegia affecting unspecified side: Secondary | ICD-10-CM | POA: Diagnosis not present

## 2018-03-25 DIAGNOSIS — I10 Essential (primary) hypertension: Secondary | ICD-10-CM | POA: Diagnosis not present

## 2018-03-25 DIAGNOSIS — Z9049 Acquired absence of other specified parts of digestive tract: Secondary | ICD-10-CM | POA: Diagnosis not present

## 2018-03-25 DIAGNOSIS — Z9181 History of falling: Secondary | ICD-10-CM | POA: Diagnosis not present

## 2018-03-25 DIAGNOSIS — L89152 Pressure ulcer of sacral region, stage 2: Secondary | ICD-10-CM | POA: Diagnosis not present

## 2018-03-25 DIAGNOSIS — I6930 Unspecified sequelae of cerebral infarction: Secondary | ICD-10-CM | POA: Diagnosis not present

## 2018-03-25 DIAGNOSIS — I2510 Atherosclerotic heart disease of native coronary artery without angina pectoris: Secondary | ICD-10-CM | POA: Diagnosis not present

## 2018-03-25 DIAGNOSIS — R630 Anorexia: Secondary | ICD-10-CM | POA: Diagnosis not present

## 2018-03-25 DIAGNOSIS — F329 Major depressive disorder, single episode, unspecified: Secondary | ICD-10-CM | POA: Diagnosis not present

## 2018-03-25 DIAGNOSIS — Z9981 Dependence on supplemental oxygen: Secondary | ICD-10-CM | POA: Diagnosis not present

## 2018-03-25 DIAGNOSIS — R0902 Hypoxemia: Secondary | ICD-10-CM | POA: Diagnosis not present

## 2018-03-25 DIAGNOSIS — Z931 Gastrostomy status: Secondary | ICD-10-CM | POA: Diagnosis not present

## 2018-03-25 DIAGNOSIS — G4734 Idiopathic sleep related nonobstructive alveolar hypoventilation: Secondary | ICD-10-CM | POA: Diagnosis not present

## 2018-03-25 DIAGNOSIS — R1312 Dysphagia, oropharyngeal phase: Secondary | ICD-10-CM | POA: Diagnosis not present

## 2018-03-25 DIAGNOSIS — M62838 Other muscle spasm: Secondary | ICD-10-CM | POA: Diagnosis not present

## 2018-03-25 DIAGNOSIS — I69359 Hemiplegia and hemiparesis following cerebral infarction affecting unspecified side: Secondary | ICD-10-CM | POA: Diagnosis not present

## 2018-03-26 DIAGNOSIS — R630 Anorexia: Secondary | ICD-10-CM | POA: Diagnosis not present

## 2018-03-26 DIAGNOSIS — G4734 Idiopathic sleep related nonobstructive alveolar hypoventilation: Secondary | ICD-10-CM | POA: Diagnosis not present

## 2018-03-26 DIAGNOSIS — F329 Major depressive disorder, single episode, unspecified: Secondary | ICD-10-CM | POA: Diagnosis not present

## 2018-03-26 DIAGNOSIS — G8100 Flaccid hemiplegia affecting unspecified side: Secondary | ICD-10-CM | POA: Diagnosis not present

## 2018-03-26 DIAGNOSIS — I69359 Hemiplegia and hemiparesis following cerebral infarction affecting unspecified side: Secondary | ICD-10-CM | POA: Diagnosis not present

## 2018-03-26 DIAGNOSIS — I6930 Unspecified sequelae of cerebral infarction: Secondary | ICD-10-CM | POA: Diagnosis not present

## 2018-03-27 DIAGNOSIS — G4734 Idiopathic sleep related nonobstructive alveolar hypoventilation: Secondary | ICD-10-CM | POA: Diagnosis not present

## 2018-03-27 DIAGNOSIS — I69359 Hemiplegia and hemiparesis following cerebral infarction affecting unspecified side: Secondary | ICD-10-CM | POA: Diagnosis not present

## 2018-03-27 DIAGNOSIS — R630 Anorexia: Secondary | ICD-10-CM | POA: Diagnosis not present

## 2018-03-27 DIAGNOSIS — F329 Major depressive disorder, single episode, unspecified: Secondary | ICD-10-CM | POA: Diagnosis not present

## 2018-03-27 DIAGNOSIS — I6930 Unspecified sequelae of cerebral infarction: Secondary | ICD-10-CM | POA: Diagnosis not present

## 2018-03-27 DIAGNOSIS — G8100 Flaccid hemiplegia affecting unspecified side: Secondary | ICD-10-CM | POA: Diagnosis not present

## 2018-03-28 DIAGNOSIS — I6930 Unspecified sequelae of cerebral infarction: Secondary | ICD-10-CM | POA: Diagnosis not present

## 2018-03-28 DIAGNOSIS — I69359 Hemiplegia and hemiparesis following cerebral infarction affecting unspecified side: Secondary | ICD-10-CM | POA: Diagnosis not present

## 2018-03-28 DIAGNOSIS — F329 Major depressive disorder, single episode, unspecified: Secondary | ICD-10-CM | POA: Diagnosis not present

## 2018-03-28 DIAGNOSIS — G4734 Idiopathic sleep related nonobstructive alveolar hypoventilation: Secondary | ICD-10-CM | POA: Diagnosis not present

## 2018-03-28 DIAGNOSIS — G8100 Flaccid hemiplegia affecting unspecified side: Secondary | ICD-10-CM | POA: Diagnosis not present

## 2018-03-28 DIAGNOSIS — R630 Anorexia: Secondary | ICD-10-CM | POA: Diagnosis not present

## 2018-03-29 DIAGNOSIS — R630 Anorexia: Secondary | ICD-10-CM | POA: Diagnosis not present

## 2018-03-29 DIAGNOSIS — F329 Major depressive disorder, single episode, unspecified: Secondary | ICD-10-CM | POA: Diagnosis not present

## 2018-03-29 DIAGNOSIS — G8100 Flaccid hemiplegia affecting unspecified side: Secondary | ICD-10-CM | POA: Diagnosis not present

## 2018-03-29 DIAGNOSIS — G4734 Idiopathic sleep related nonobstructive alveolar hypoventilation: Secondary | ICD-10-CM | POA: Diagnosis not present

## 2018-03-29 DIAGNOSIS — I69359 Hemiplegia and hemiparesis following cerebral infarction affecting unspecified side: Secondary | ICD-10-CM | POA: Diagnosis not present

## 2018-03-29 DIAGNOSIS — I6930 Unspecified sequelae of cerebral infarction: Secondary | ICD-10-CM | POA: Diagnosis not present

## 2018-03-30 DIAGNOSIS — R630 Anorexia: Secondary | ICD-10-CM | POA: Diagnosis not present

## 2018-03-30 DIAGNOSIS — I6930 Unspecified sequelae of cerebral infarction: Secondary | ICD-10-CM | POA: Diagnosis not present

## 2018-03-30 DIAGNOSIS — F329 Major depressive disorder, single episode, unspecified: Secondary | ICD-10-CM | POA: Diagnosis not present

## 2018-03-30 DIAGNOSIS — G8100 Flaccid hemiplegia affecting unspecified side: Secondary | ICD-10-CM | POA: Diagnosis not present

## 2018-03-30 DIAGNOSIS — I69359 Hemiplegia and hemiparesis following cerebral infarction affecting unspecified side: Secondary | ICD-10-CM | POA: Diagnosis not present

## 2018-03-30 DIAGNOSIS — G4734 Idiopathic sleep related nonobstructive alveolar hypoventilation: Secondary | ICD-10-CM | POA: Diagnosis not present

## 2018-04-02 DIAGNOSIS — I6930 Unspecified sequelae of cerebral infarction: Secondary | ICD-10-CM | POA: Diagnosis not present

## 2018-04-02 DIAGNOSIS — F329 Major depressive disorder, single episode, unspecified: Secondary | ICD-10-CM | POA: Diagnosis not present

## 2018-04-02 DIAGNOSIS — G8100 Flaccid hemiplegia affecting unspecified side: Secondary | ICD-10-CM | POA: Diagnosis not present

## 2018-04-02 DIAGNOSIS — R630 Anorexia: Secondary | ICD-10-CM | POA: Diagnosis not present

## 2018-04-02 DIAGNOSIS — G4734 Idiopathic sleep related nonobstructive alveolar hypoventilation: Secondary | ICD-10-CM | POA: Diagnosis not present

## 2018-04-02 DIAGNOSIS — I69359 Hemiplegia and hemiparesis following cerebral infarction affecting unspecified side: Secondary | ICD-10-CM | POA: Diagnosis not present

## 2018-04-03 DIAGNOSIS — G4734 Idiopathic sleep related nonobstructive alveolar hypoventilation: Secondary | ICD-10-CM | POA: Diagnosis not present

## 2018-04-03 DIAGNOSIS — F329 Major depressive disorder, single episode, unspecified: Secondary | ICD-10-CM | POA: Diagnosis not present

## 2018-04-03 DIAGNOSIS — I6930 Unspecified sequelae of cerebral infarction: Secondary | ICD-10-CM | POA: Diagnosis not present

## 2018-04-03 DIAGNOSIS — I69359 Hemiplegia and hemiparesis following cerebral infarction affecting unspecified side: Secondary | ICD-10-CM | POA: Diagnosis not present

## 2018-04-03 DIAGNOSIS — G8100 Flaccid hemiplegia affecting unspecified side: Secondary | ICD-10-CM | POA: Diagnosis not present

## 2018-04-03 DIAGNOSIS — R630 Anorexia: Secondary | ICD-10-CM | POA: Diagnosis not present

## 2018-04-04 DIAGNOSIS — G8100 Flaccid hemiplegia affecting unspecified side: Secondary | ICD-10-CM | POA: Diagnosis not present

## 2018-04-04 DIAGNOSIS — G4734 Idiopathic sleep related nonobstructive alveolar hypoventilation: Secondary | ICD-10-CM | POA: Diagnosis not present

## 2018-04-04 DIAGNOSIS — F329 Major depressive disorder, single episode, unspecified: Secondary | ICD-10-CM | POA: Diagnosis not present

## 2018-04-04 DIAGNOSIS — I6930 Unspecified sequelae of cerebral infarction: Secondary | ICD-10-CM | POA: Diagnosis not present

## 2018-04-04 DIAGNOSIS — I69359 Hemiplegia and hemiparesis following cerebral infarction affecting unspecified side: Secondary | ICD-10-CM | POA: Diagnosis not present

## 2018-04-04 DIAGNOSIS — R630 Anorexia: Secondary | ICD-10-CM | POA: Diagnosis not present

## 2018-04-05 DIAGNOSIS — G4734 Idiopathic sleep related nonobstructive alveolar hypoventilation: Secondary | ICD-10-CM | POA: Diagnosis not present

## 2018-04-05 DIAGNOSIS — R630 Anorexia: Secondary | ICD-10-CM | POA: Diagnosis not present

## 2018-04-05 DIAGNOSIS — G8100 Flaccid hemiplegia affecting unspecified side: Secondary | ICD-10-CM | POA: Diagnosis not present

## 2018-04-05 DIAGNOSIS — F329 Major depressive disorder, single episode, unspecified: Secondary | ICD-10-CM | POA: Diagnosis not present

## 2018-04-05 DIAGNOSIS — I6930 Unspecified sequelae of cerebral infarction: Secondary | ICD-10-CM | POA: Diagnosis not present

## 2018-04-05 DIAGNOSIS — I69359 Hemiplegia and hemiparesis following cerebral infarction affecting unspecified side: Secondary | ICD-10-CM | POA: Diagnosis not present

## 2018-04-06 DIAGNOSIS — F329 Major depressive disorder, single episode, unspecified: Secondary | ICD-10-CM | POA: Diagnosis not present

## 2018-04-06 DIAGNOSIS — G4734 Idiopathic sleep related nonobstructive alveolar hypoventilation: Secondary | ICD-10-CM | POA: Diagnosis not present

## 2018-04-06 DIAGNOSIS — G8100 Flaccid hemiplegia affecting unspecified side: Secondary | ICD-10-CM | POA: Diagnosis not present

## 2018-04-06 DIAGNOSIS — I6930 Unspecified sequelae of cerebral infarction: Secondary | ICD-10-CM | POA: Diagnosis not present

## 2018-04-06 DIAGNOSIS — I69359 Hemiplegia and hemiparesis following cerebral infarction affecting unspecified side: Secondary | ICD-10-CM | POA: Diagnosis not present

## 2018-04-06 DIAGNOSIS — R630 Anorexia: Secondary | ICD-10-CM | POA: Diagnosis not present

## 2018-04-09 DIAGNOSIS — G8100 Flaccid hemiplegia affecting unspecified side: Secondary | ICD-10-CM | POA: Diagnosis not present

## 2018-04-09 DIAGNOSIS — I69359 Hemiplegia and hemiparesis following cerebral infarction affecting unspecified side: Secondary | ICD-10-CM | POA: Diagnosis not present

## 2018-04-09 DIAGNOSIS — I6930 Unspecified sequelae of cerebral infarction: Secondary | ICD-10-CM | POA: Diagnosis not present

## 2018-04-09 DIAGNOSIS — F329 Major depressive disorder, single episode, unspecified: Secondary | ICD-10-CM | POA: Diagnosis not present

## 2018-04-09 DIAGNOSIS — R630 Anorexia: Secondary | ICD-10-CM | POA: Diagnosis not present

## 2018-04-09 DIAGNOSIS — G4734 Idiopathic sleep related nonobstructive alveolar hypoventilation: Secondary | ICD-10-CM | POA: Diagnosis not present

## 2018-04-10 DIAGNOSIS — I69359 Hemiplegia and hemiparesis following cerebral infarction affecting unspecified side: Secondary | ICD-10-CM | POA: Diagnosis not present

## 2018-04-10 DIAGNOSIS — F329 Major depressive disorder, single episode, unspecified: Secondary | ICD-10-CM | POA: Diagnosis not present

## 2018-04-10 DIAGNOSIS — G8100 Flaccid hemiplegia affecting unspecified side: Secondary | ICD-10-CM | POA: Diagnosis not present

## 2018-04-10 DIAGNOSIS — R630 Anorexia: Secondary | ICD-10-CM | POA: Diagnosis not present

## 2018-04-10 DIAGNOSIS — G4734 Idiopathic sleep related nonobstructive alveolar hypoventilation: Secondary | ICD-10-CM | POA: Diagnosis not present

## 2018-04-10 DIAGNOSIS — I6930 Unspecified sequelae of cerebral infarction: Secondary | ICD-10-CM | POA: Diagnosis not present

## 2018-04-11 DIAGNOSIS — G8100 Flaccid hemiplegia affecting unspecified side: Secondary | ICD-10-CM | POA: Diagnosis not present

## 2018-04-11 DIAGNOSIS — F329 Major depressive disorder, single episode, unspecified: Secondary | ICD-10-CM | POA: Diagnosis not present

## 2018-04-11 DIAGNOSIS — G4734 Idiopathic sleep related nonobstructive alveolar hypoventilation: Secondary | ICD-10-CM | POA: Diagnosis not present

## 2018-04-11 DIAGNOSIS — I6930 Unspecified sequelae of cerebral infarction: Secondary | ICD-10-CM | POA: Diagnosis not present

## 2018-04-11 DIAGNOSIS — R630 Anorexia: Secondary | ICD-10-CM | POA: Diagnosis not present

## 2018-04-11 DIAGNOSIS — I69359 Hemiplegia and hemiparesis following cerebral infarction affecting unspecified side: Secondary | ICD-10-CM | POA: Diagnosis not present

## 2018-04-12 DIAGNOSIS — R630 Anorexia: Secondary | ICD-10-CM | POA: Diagnosis not present

## 2018-04-12 DIAGNOSIS — I69359 Hemiplegia and hemiparesis following cerebral infarction affecting unspecified side: Secondary | ICD-10-CM | POA: Diagnosis not present

## 2018-04-12 DIAGNOSIS — G8100 Flaccid hemiplegia affecting unspecified side: Secondary | ICD-10-CM | POA: Diagnosis not present

## 2018-04-12 DIAGNOSIS — F329 Major depressive disorder, single episode, unspecified: Secondary | ICD-10-CM | POA: Diagnosis not present

## 2018-04-12 DIAGNOSIS — G4734 Idiopathic sleep related nonobstructive alveolar hypoventilation: Secondary | ICD-10-CM | POA: Diagnosis not present

## 2018-04-12 DIAGNOSIS — I6930 Unspecified sequelae of cerebral infarction: Secondary | ICD-10-CM | POA: Diagnosis not present

## 2018-04-13 DIAGNOSIS — I6930 Unspecified sequelae of cerebral infarction: Secondary | ICD-10-CM | POA: Diagnosis not present

## 2018-04-13 DIAGNOSIS — I69359 Hemiplegia and hemiparesis following cerebral infarction affecting unspecified side: Secondary | ICD-10-CM | POA: Diagnosis not present

## 2018-04-13 DIAGNOSIS — G4734 Idiopathic sleep related nonobstructive alveolar hypoventilation: Secondary | ICD-10-CM | POA: Diagnosis not present

## 2018-04-13 DIAGNOSIS — G8100 Flaccid hemiplegia affecting unspecified side: Secondary | ICD-10-CM | POA: Diagnosis not present

## 2018-04-13 DIAGNOSIS — F329 Major depressive disorder, single episode, unspecified: Secondary | ICD-10-CM | POA: Diagnosis not present

## 2018-04-13 DIAGNOSIS — R630 Anorexia: Secondary | ICD-10-CM | POA: Diagnosis not present

## 2018-04-16 DIAGNOSIS — F329 Major depressive disorder, single episode, unspecified: Secondary | ICD-10-CM | POA: Diagnosis not present

## 2018-04-16 DIAGNOSIS — I6930 Unspecified sequelae of cerebral infarction: Secondary | ICD-10-CM | POA: Diagnosis not present

## 2018-04-16 DIAGNOSIS — R630 Anorexia: Secondary | ICD-10-CM | POA: Diagnosis not present

## 2018-04-16 DIAGNOSIS — G4734 Idiopathic sleep related nonobstructive alveolar hypoventilation: Secondary | ICD-10-CM | POA: Diagnosis not present

## 2018-04-16 DIAGNOSIS — I69359 Hemiplegia and hemiparesis following cerebral infarction affecting unspecified side: Secondary | ICD-10-CM | POA: Diagnosis not present

## 2018-04-16 DIAGNOSIS — G8100 Flaccid hemiplegia affecting unspecified side: Secondary | ICD-10-CM | POA: Diagnosis not present

## 2018-04-17 DIAGNOSIS — I6930 Unspecified sequelae of cerebral infarction: Secondary | ICD-10-CM | POA: Diagnosis not present

## 2018-04-17 DIAGNOSIS — F329 Major depressive disorder, single episode, unspecified: Secondary | ICD-10-CM | POA: Diagnosis not present

## 2018-04-17 DIAGNOSIS — R630 Anorexia: Secondary | ICD-10-CM | POA: Diagnosis not present

## 2018-04-17 DIAGNOSIS — G8100 Flaccid hemiplegia affecting unspecified side: Secondary | ICD-10-CM | POA: Diagnosis not present

## 2018-04-17 DIAGNOSIS — G4734 Idiopathic sleep related nonobstructive alveolar hypoventilation: Secondary | ICD-10-CM | POA: Diagnosis not present

## 2018-04-17 DIAGNOSIS — I69359 Hemiplegia and hemiparesis following cerebral infarction affecting unspecified side: Secondary | ICD-10-CM | POA: Diagnosis not present

## 2018-04-19 DIAGNOSIS — R630 Anorexia: Secondary | ICD-10-CM | POA: Diagnosis not present

## 2018-04-19 DIAGNOSIS — I6930 Unspecified sequelae of cerebral infarction: Secondary | ICD-10-CM | POA: Diagnosis not present

## 2018-04-19 DIAGNOSIS — G4734 Idiopathic sleep related nonobstructive alveolar hypoventilation: Secondary | ICD-10-CM | POA: Diagnosis not present

## 2018-04-19 DIAGNOSIS — I69359 Hemiplegia and hemiparesis following cerebral infarction affecting unspecified side: Secondary | ICD-10-CM | POA: Diagnosis not present

## 2018-04-19 DIAGNOSIS — F329 Major depressive disorder, single episode, unspecified: Secondary | ICD-10-CM | POA: Diagnosis not present

## 2018-04-19 DIAGNOSIS — G8100 Flaccid hemiplegia affecting unspecified side: Secondary | ICD-10-CM | POA: Diagnosis not present

## 2018-04-20 DIAGNOSIS — I69359 Hemiplegia and hemiparesis following cerebral infarction affecting unspecified side: Secondary | ICD-10-CM | POA: Diagnosis not present

## 2018-04-20 DIAGNOSIS — G4734 Idiopathic sleep related nonobstructive alveolar hypoventilation: Secondary | ICD-10-CM | POA: Diagnosis not present

## 2018-04-20 DIAGNOSIS — R630 Anorexia: Secondary | ICD-10-CM | POA: Diagnosis not present

## 2018-04-20 DIAGNOSIS — G8100 Flaccid hemiplegia affecting unspecified side: Secondary | ICD-10-CM | POA: Diagnosis not present

## 2018-04-20 DIAGNOSIS — I6930 Unspecified sequelae of cerebral infarction: Secondary | ICD-10-CM | POA: Diagnosis not present

## 2018-04-20 DIAGNOSIS — F329 Major depressive disorder, single episode, unspecified: Secondary | ICD-10-CM | POA: Diagnosis not present

## 2018-04-23 DIAGNOSIS — F329 Major depressive disorder, single episode, unspecified: Secondary | ICD-10-CM | POA: Diagnosis not present

## 2018-04-23 DIAGNOSIS — G8100 Flaccid hemiplegia affecting unspecified side: Secondary | ICD-10-CM | POA: Diagnosis not present

## 2018-04-23 DIAGNOSIS — G4734 Idiopathic sleep related nonobstructive alveolar hypoventilation: Secondary | ICD-10-CM | POA: Diagnosis not present

## 2018-04-23 DIAGNOSIS — I69359 Hemiplegia and hemiparesis following cerebral infarction affecting unspecified side: Secondary | ICD-10-CM | POA: Diagnosis not present

## 2018-04-23 DIAGNOSIS — R630 Anorexia: Secondary | ICD-10-CM | POA: Diagnosis not present

## 2018-04-23 DIAGNOSIS — I6930 Unspecified sequelae of cerebral infarction: Secondary | ICD-10-CM | POA: Diagnosis not present

## 2018-04-24 DIAGNOSIS — I69359 Hemiplegia and hemiparesis following cerebral infarction affecting unspecified side: Secondary | ICD-10-CM | POA: Diagnosis not present

## 2018-04-24 DIAGNOSIS — R630 Anorexia: Secondary | ICD-10-CM | POA: Diagnosis not present

## 2018-04-24 DIAGNOSIS — F329 Major depressive disorder, single episode, unspecified: Secondary | ICD-10-CM | POA: Diagnosis not present

## 2018-04-24 DIAGNOSIS — G8100 Flaccid hemiplegia affecting unspecified side: Secondary | ICD-10-CM | POA: Diagnosis not present

## 2018-04-24 DIAGNOSIS — I6930 Unspecified sequelae of cerebral infarction: Secondary | ICD-10-CM | POA: Diagnosis not present

## 2018-04-24 DIAGNOSIS — G4734 Idiopathic sleep related nonobstructive alveolar hypoventilation: Secondary | ICD-10-CM | POA: Diagnosis not present

## 2018-04-25 DIAGNOSIS — L89152 Pressure ulcer of sacral region, stage 2: Secondary | ICD-10-CM | POA: Diagnosis not present

## 2018-04-25 DIAGNOSIS — I6930 Unspecified sequelae of cerebral infarction: Secondary | ICD-10-CM | POA: Diagnosis not present

## 2018-04-25 DIAGNOSIS — Z9181 History of falling: Secondary | ICD-10-CM | POA: Diagnosis not present

## 2018-04-25 DIAGNOSIS — I10 Essential (primary) hypertension: Secondary | ICD-10-CM | POA: Diagnosis not present

## 2018-04-25 DIAGNOSIS — G8100 Flaccid hemiplegia affecting unspecified side: Secondary | ICD-10-CM | POA: Diagnosis not present

## 2018-04-25 DIAGNOSIS — R630 Anorexia: Secondary | ICD-10-CM | POA: Diagnosis not present

## 2018-04-25 DIAGNOSIS — R531 Weakness: Secondary | ICD-10-CM | POA: Diagnosis not present

## 2018-04-25 DIAGNOSIS — Z9049 Acquired absence of other specified parts of digestive tract: Secondary | ICD-10-CM | POA: Diagnosis not present

## 2018-04-25 DIAGNOSIS — I2510 Atherosclerotic heart disease of native coronary artery without angina pectoris: Secondary | ICD-10-CM | POA: Diagnosis not present

## 2018-04-25 DIAGNOSIS — Z931 Gastrostomy status: Secondary | ICD-10-CM | POA: Diagnosis not present

## 2018-04-25 DIAGNOSIS — F329 Major depressive disorder, single episode, unspecified: Secondary | ICD-10-CM | POA: Diagnosis not present

## 2018-04-25 DIAGNOSIS — R0902 Hypoxemia: Secondary | ICD-10-CM | POA: Diagnosis not present

## 2018-04-25 DIAGNOSIS — I69359 Hemiplegia and hemiparesis following cerebral infarction affecting unspecified side: Secondary | ICD-10-CM | POA: Diagnosis not present

## 2018-04-25 DIAGNOSIS — M62838 Other muscle spasm: Secondary | ICD-10-CM | POA: Diagnosis not present

## 2018-04-25 DIAGNOSIS — R1312 Dysphagia, oropharyngeal phase: Secondary | ICD-10-CM | POA: Diagnosis not present

## 2018-04-25 DIAGNOSIS — G4734 Idiopathic sleep related nonobstructive alveolar hypoventilation: Secondary | ICD-10-CM | POA: Diagnosis not present

## 2018-04-25 DIAGNOSIS — R1310 Dysphagia, unspecified: Secondary | ICD-10-CM | POA: Diagnosis not present

## 2018-04-25 DIAGNOSIS — Z9981 Dependence on supplemental oxygen: Secondary | ICD-10-CM | POA: Diagnosis not present

## 2018-04-26 DIAGNOSIS — F329 Major depressive disorder, single episode, unspecified: Secondary | ICD-10-CM | POA: Diagnosis not present

## 2018-04-26 DIAGNOSIS — I69359 Hemiplegia and hemiparesis following cerebral infarction affecting unspecified side: Secondary | ICD-10-CM | POA: Diagnosis not present

## 2018-04-26 DIAGNOSIS — G8100 Flaccid hemiplegia affecting unspecified side: Secondary | ICD-10-CM | POA: Diagnosis not present

## 2018-04-26 DIAGNOSIS — R630 Anorexia: Secondary | ICD-10-CM | POA: Diagnosis not present

## 2018-04-26 DIAGNOSIS — G4734 Idiopathic sleep related nonobstructive alveolar hypoventilation: Secondary | ICD-10-CM | POA: Diagnosis not present

## 2018-04-26 DIAGNOSIS — I6930 Unspecified sequelae of cerebral infarction: Secondary | ICD-10-CM | POA: Diagnosis not present

## 2018-04-27 DIAGNOSIS — I69359 Hemiplegia and hemiparesis following cerebral infarction affecting unspecified side: Secondary | ICD-10-CM | POA: Diagnosis not present

## 2018-04-27 DIAGNOSIS — F329 Major depressive disorder, single episode, unspecified: Secondary | ICD-10-CM | POA: Diagnosis not present

## 2018-04-27 DIAGNOSIS — R630 Anorexia: Secondary | ICD-10-CM | POA: Diagnosis not present

## 2018-04-27 DIAGNOSIS — I6930 Unspecified sequelae of cerebral infarction: Secondary | ICD-10-CM | POA: Diagnosis not present

## 2018-04-27 DIAGNOSIS — G8100 Flaccid hemiplegia affecting unspecified side: Secondary | ICD-10-CM | POA: Diagnosis not present

## 2018-04-27 DIAGNOSIS — G4734 Idiopathic sleep related nonobstructive alveolar hypoventilation: Secondary | ICD-10-CM | POA: Diagnosis not present

## 2018-04-30 DIAGNOSIS — F329 Major depressive disorder, single episode, unspecified: Secondary | ICD-10-CM | POA: Diagnosis not present

## 2018-04-30 DIAGNOSIS — I6930 Unspecified sequelae of cerebral infarction: Secondary | ICD-10-CM | POA: Diagnosis not present

## 2018-04-30 DIAGNOSIS — G8100 Flaccid hemiplegia affecting unspecified side: Secondary | ICD-10-CM | POA: Diagnosis not present

## 2018-04-30 DIAGNOSIS — I69359 Hemiplegia and hemiparesis following cerebral infarction affecting unspecified side: Secondary | ICD-10-CM | POA: Diagnosis not present

## 2018-04-30 DIAGNOSIS — R630 Anorexia: Secondary | ICD-10-CM | POA: Diagnosis not present

## 2018-04-30 DIAGNOSIS — G4734 Idiopathic sleep related nonobstructive alveolar hypoventilation: Secondary | ICD-10-CM | POA: Diagnosis not present

## 2018-05-01 DIAGNOSIS — G8100 Flaccid hemiplegia affecting unspecified side: Secondary | ICD-10-CM | POA: Diagnosis not present

## 2018-05-01 DIAGNOSIS — G4734 Idiopathic sleep related nonobstructive alveolar hypoventilation: Secondary | ICD-10-CM | POA: Diagnosis not present

## 2018-05-01 DIAGNOSIS — I69359 Hemiplegia and hemiparesis following cerebral infarction affecting unspecified side: Secondary | ICD-10-CM | POA: Diagnosis not present

## 2018-05-01 DIAGNOSIS — F329 Major depressive disorder, single episode, unspecified: Secondary | ICD-10-CM | POA: Diagnosis not present

## 2018-05-01 DIAGNOSIS — I6930 Unspecified sequelae of cerebral infarction: Secondary | ICD-10-CM | POA: Diagnosis not present

## 2018-05-01 DIAGNOSIS — R630 Anorexia: Secondary | ICD-10-CM | POA: Diagnosis not present

## 2018-05-02 DIAGNOSIS — G4734 Idiopathic sleep related nonobstructive alveolar hypoventilation: Secondary | ICD-10-CM | POA: Diagnosis not present

## 2018-05-02 DIAGNOSIS — I69359 Hemiplegia and hemiparesis following cerebral infarction affecting unspecified side: Secondary | ICD-10-CM | POA: Diagnosis not present

## 2018-05-02 DIAGNOSIS — G8100 Flaccid hemiplegia affecting unspecified side: Secondary | ICD-10-CM | POA: Diagnosis not present

## 2018-05-02 DIAGNOSIS — R630 Anorexia: Secondary | ICD-10-CM | POA: Diagnosis not present

## 2018-05-02 DIAGNOSIS — F329 Major depressive disorder, single episode, unspecified: Secondary | ICD-10-CM | POA: Diagnosis not present

## 2018-05-02 DIAGNOSIS — I6930 Unspecified sequelae of cerebral infarction: Secondary | ICD-10-CM | POA: Diagnosis not present

## 2018-05-03 DIAGNOSIS — G4734 Idiopathic sleep related nonobstructive alveolar hypoventilation: Secondary | ICD-10-CM | POA: Diagnosis not present

## 2018-05-03 DIAGNOSIS — I69359 Hemiplegia and hemiparesis following cerebral infarction affecting unspecified side: Secondary | ICD-10-CM | POA: Diagnosis not present

## 2018-05-03 DIAGNOSIS — I6930 Unspecified sequelae of cerebral infarction: Secondary | ICD-10-CM | POA: Diagnosis not present

## 2018-05-03 DIAGNOSIS — G8100 Flaccid hemiplegia affecting unspecified side: Secondary | ICD-10-CM | POA: Diagnosis not present

## 2018-05-03 DIAGNOSIS — R630 Anorexia: Secondary | ICD-10-CM | POA: Diagnosis not present

## 2018-05-03 DIAGNOSIS — F329 Major depressive disorder, single episode, unspecified: Secondary | ICD-10-CM | POA: Diagnosis not present

## 2018-05-04 DIAGNOSIS — G8100 Flaccid hemiplegia affecting unspecified side: Secondary | ICD-10-CM | POA: Diagnosis not present

## 2018-05-04 DIAGNOSIS — I69359 Hemiplegia and hemiparesis following cerebral infarction affecting unspecified side: Secondary | ICD-10-CM | POA: Diagnosis not present

## 2018-05-04 DIAGNOSIS — F329 Major depressive disorder, single episode, unspecified: Secondary | ICD-10-CM | POA: Diagnosis not present

## 2018-05-04 DIAGNOSIS — I6930 Unspecified sequelae of cerebral infarction: Secondary | ICD-10-CM | POA: Diagnosis not present

## 2018-05-04 DIAGNOSIS — G4734 Idiopathic sleep related nonobstructive alveolar hypoventilation: Secondary | ICD-10-CM | POA: Diagnosis not present

## 2018-05-04 DIAGNOSIS — R630 Anorexia: Secondary | ICD-10-CM | POA: Diagnosis not present

## 2018-05-07 DIAGNOSIS — F329 Major depressive disorder, single episode, unspecified: Secondary | ICD-10-CM | POA: Diagnosis not present

## 2018-05-07 DIAGNOSIS — I6930 Unspecified sequelae of cerebral infarction: Secondary | ICD-10-CM | POA: Diagnosis not present

## 2018-05-07 DIAGNOSIS — G8100 Flaccid hemiplegia affecting unspecified side: Secondary | ICD-10-CM | POA: Diagnosis not present

## 2018-05-07 DIAGNOSIS — G4734 Idiopathic sleep related nonobstructive alveolar hypoventilation: Secondary | ICD-10-CM | POA: Diagnosis not present

## 2018-05-07 DIAGNOSIS — I69359 Hemiplegia and hemiparesis following cerebral infarction affecting unspecified side: Secondary | ICD-10-CM | POA: Diagnosis not present

## 2018-05-07 DIAGNOSIS — R630 Anorexia: Secondary | ICD-10-CM | POA: Diagnosis not present

## 2018-05-08 DIAGNOSIS — F329 Major depressive disorder, single episode, unspecified: Secondary | ICD-10-CM | POA: Diagnosis not present

## 2018-05-08 DIAGNOSIS — G4734 Idiopathic sleep related nonobstructive alveolar hypoventilation: Secondary | ICD-10-CM | POA: Diagnosis not present

## 2018-05-08 DIAGNOSIS — G8100 Flaccid hemiplegia affecting unspecified side: Secondary | ICD-10-CM | POA: Diagnosis not present

## 2018-05-08 DIAGNOSIS — I6930 Unspecified sequelae of cerebral infarction: Secondary | ICD-10-CM | POA: Diagnosis not present

## 2018-05-08 DIAGNOSIS — I69359 Hemiplegia and hemiparesis following cerebral infarction affecting unspecified side: Secondary | ICD-10-CM | POA: Diagnosis not present

## 2018-05-08 DIAGNOSIS — R630 Anorexia: Secondary | ICD-10-CM | POA: Diagnosis not present

## 2018-05-09 DIAGNOSIS — I69359 Hemiplegia and hemiparesis following cerebral infarction affecting unspecified side: Secondary | ICD-10-CM | POA: Diagnosis not present

## 2018-05-09 DIAGNOSIS — G8100 Flaccid hemiplegia affecting unspecified side: Secondary | ICD-10-CM | POA: Diagnosis not present

## 2018-05-09 DIAGNOSIS — R630 Anorexia: Secondary | ICD-10-CM | POA: Diagnosis not present

## 2018-05-09 DIAGNOSIS — F329 Major depressive disorder, single episode, unspecified: Secondary | ICD-10-CM | POA: Diagnosis not present

## 2018-05-09 DIAGNOSIS — I6930 Unspecified sequelae of cerebral infarction: Secondary | ICD-10-CM | POA: Diagnosis not present

## 2018-05-09 DIAGNOSIS — G4734 Idiopathic sleep related nonobstructive alveolar hypoventilation: Secondary | ICD-10-CM | POA: Diagnosis not present

## 2018-05-10 DIAGNOSIS — G4734 Idiopathic sleep related nonobstructive alveolar hypoventilation: Secondary | ICD-10-CM | POA: Diagnosis not present

## 2018-05-10 DIAGNOSIS — I69359 Hemiplegia and hemiparesis following cerebral infarction affecting unspecified side: Secondary | ICD-10-CM | POA: Diagnosis not present

## 2018-05-10 DIAGNOSIS — I6930 Unspecified sequelae of cerebral infarction: Secondary | ICD-10-CM | POA: Diagnosis not present

## 2018-05-10 DIAGNOSIS — F329 Major depressive disorder, single episode, unspecified: Secondary | ICD-10-CM | POA: Diagnosis not present

## 2018-05-10 DIAGNOSIS — R630 Anorexia: Secondary | ICD-10-CM | POA: Diagnosis not present

## 2018-05-10 DIAGNOSIS — G8100 Flaccid hemiplegia affecting unspecified side: Secondary | ICD-10-CM | POA: Diagnosis not present

## 2018-05-11 DIAGNOSIS — I6930 Unspecified sequelae of cerebral infarction: Secondary | ICD-10-CM | POA: Diagnosis not present

## 2018-05-11 DIAGNOSIS — F329 Major depressive disorder, single episode, unspecified: Secondary | ICD-10-CM | POA: Diagnosis not present

## 2018-05-11 DIAGNOSIS — I69359 Hemiplegia and hemiparesis following cerebral infarction affecting unspecified side: Secondary | ICD-10-CM | POA: Diagnosis not present

## 2018-05-11 DIAGNOSIS — G4734 Idiopathic sleep related nonobstructive alveolar hypoventilation: Secondary | ICD-10-CM | POA: Diagnosis not present

## 2018-05-11 DIAGNOSIS — R630 Anorexia: Secondary | ICD-10-CM | POA: Diagnosis not present

## 2018-05-11 DIAGNOSIS — G8100 Flaccid hemiplegia affecting unspecified side: Secondary | ICD-10-CM | POA: Diagnosis not present

## 2018-05-14 DIAGNOSIS — I6930 Unspecified sequelae of cerebral infarction: Secondary | ICD-10-CM | POA: Diagnosis not present

## 2018-05-14 DIAGNOSIS — R630 Anorexia: Secondary | ICD-10-CM | POA: Diagnosis not present

## 2018-05-14 DIAGNOSIS — F329 Major depressive disorder, single episode, unspecified: Secondary | ICD-10-CM | POA: Diagnosis not present

## 2018-05-14 DIAGNOSIS — G4734 Idiopathic sleep related nonobstructive alveolar hypoventilation: Secondary | ICD-10-CM | POA: Diagnosis not present

## 2018-05-14 DIAGNOSIS — G8100 Flaccid hemiplegia affecting unspecified side: Secondary | ICD-10-CM | POA: Diagnosis not present

## 2018-05-14 DIAGNOSIS — I69359 Hemiplegia and hemiparesis following cerebral infarction affecting unspecified side: Secondary | ICD-10-CM | POA: Diagnosis not present

## 2018-05-15 DIAGNOSIS — F329 Major depressive disorder, single episode, unspecified: Secondary | ICD-10-CM | POA: Diagnosis not present

## 2018-05-15 DIAGNOSIS — I6930 Unspecified sequelae of cerebral infarction: Secondary | ICD-10-CM | POA: Diagnosis not present

## 2018-05-15 DIAGNOSIS — R630 Anorexia: Secondary | ICD-10-CM | POA: Diagnosis not present

## 2018-05-15 DIAGNOSIS — I69359 Hemiplegia and hemiparesis following cerebral infarction affecting unspecified side: Secondary | ICD-10-CM | POA: Diagnosis not present

## 2018-05-15 DIAGNOSIS — G4734 Idiopathic sleep related nonobstructive alveolar hypoventilation: Secondary | ICD-10-CM | POA: Diagnosis not present

## 2018-05-15 DIAGNOSIS — G8100 Flaccid hemiplegia affecting unspecified side: Secondary | ICD-10-CM | POA: Diagnosis not present

## 2018-05-16 DIAGNOSIS — R630 Anorexia: Secondary | ICD-10-CM | POA: Diagnosis not present

## 2018-05-16 DIAGNOSIS — F329 Major depressive disorder, single episode, unspecified: Secondary | ICD-10-CM | POA: Diagnosis not present

## 2018-05-16 DIAGNOSIS — G4734 Idiopathic sleep related nonobstructive alveolar hypoventilation: Secondary | ICD-10-CM | POA: Diagnosis not present

## 2018-05-16 DIAGNOSIS — I69359 Hemiplegia and hemiparesis following cerebral infarction affecting unspecified side: Secondary | ICD-10-CM | POA: Diagnosis not present

## 2018-05-16 DIAGNOSIS — I6930 Unspecified sequelae of cerebral infarction: Secondary | ICD-10-CM | POA: Diagnosis not present

## 2018-05-16 DIAGNOSIS — G8100 Flaccid hemiplegia affecting unspecified side: Secondary | ICD-10-CM | POA: Diagnosis not present

## 2018-05-17 DIAGNOSIS — R630 Anorexia: Secondary | ICD-10-CM | POA: Diagnosis not present

## 2018-05-17 DIAGNOSIS — G4734 Idiopathic sleep related nonobstructive alveolar hypoventilation: Secondary | ICD-10-CM | POA: Diagnosis not present

## 2018-05-17 DIAGNOSIS — F329 Major depressive disorder, single episode, unspecified: Secondary | ICD-10-CM | POA: Diagnosis not present

## 2018-05-17 DIAGNOSIS — I6930 Unspecified sequelae of cerebral infarction: Secondary | ICD-10-CM | POA: Diagnosis not present

## 2018-05-17 DIAGNOSIS — I69359 Hemiplegia and hemiparesis following cerebral infarction affecting unspecified side: Secondary | ICD-10-CM | POA: Diagnosis not present

## 2018-05-17 DIAGNOSIS — G8100 Flaccid hemiplegia affecting unspecified side: Secondary | ICD-10-CM | POA: Diagnosis not present

## 2018-05-21 DIAGNOSIS — G4734 Idiopathic sleep related nonobstructive alveolar hypoventilation: Secondary | ICD-10-CM | POA: Diagnosis not present

## 2018-05-21 DIAGNOSIS — I69359 Hemiplegia and hemiparesis following cerebral infarction affecting unspecified side: Secondary | ICD-10-CM | POA: Diagnosis not present

## 2018-05-21 DIAGNOSIS — G8100 Flaccid hemiplegia affecting unspecified side: Secondary | ICD-10-CM | POA: Diagnosis not present

## 2018-05-21 DIAGNOSIS — F329 Major depressive disorder, single episode, unspecified: Secondary | ICD-10-CM | POA: Diagnosis not present

## 2018-05-21 DIAGNOSIS — I6930 Unspecified sequelae of cerebral infarction: Secondary | ICD-10-CM | POA: Diagnosis not present

## 2018-05-21 DIAGNOSIS — R630 Anorexia: Secondary | ICD-10-CM | POA: Diagnosis not present

## 2018-05-22 DIAGNOSIS — R630 Anorexia: Secondary | ICD-10-CM | POA: Diagnosis not present

## 2018-05-22 DIAGNOSIS — I69359 Hemiplegia and hemiparesis following cerebral infarction affecting unspecified side: Secondary | ICD-10-CM | POA: Diagnosis not present

## 2018-05-22 DIAGNOSIS — F329 Major depressive disorder, single episode, unspecified: Secondary | ICD-10-CM | POA: Diagnosis not present

## 2018-05-22 DIAGNOSIS — I6930 Unspecified sequelae of cerebral infarction: Secondary | ICD-10-CM | POA: Diagnosis not present

## 2018-05-22 DIAGNOSIS — G8100 Flaccid hemiplegia affecting unspecified side: Secondary | ICD-10-CM | POA: Diagnosis not present

## 2018-05-22 DIAGNOSIS — G4734 Idiopathic sleep related nonobstructive alveolar hypoventilation: Secondary | ICD-10-CM | POA: Diagnosis not present

## 2018-05-23 DIAGNOSIS — F329 Major depressive disorder, single episode, unspecified: Secondary | ICD-10-CM | POA: Diagnosis not present

## 2018-05-23 DIAGNOSIS — I69359 Hemiplegia and hemiparesis following cerebral infarction affecting unspecified side: Secondary | ICD-10-CM | POA: Diagnosis not present

## 2018-05-23 DIAGNOSIS — I6930 Unspecified sequelae of cerebral infarction: Secondary | ICD-10-CM | POA: Diagnosis not present

## 2018-05-23 DIAGNOSIS — G4734 Idiopathic sleep related nonobstructive alveolar hypoventilation: Secondary | ICD-10-CM | POA: Diagnosis not present

## 2018-05-23 DIAGNOSIS — R630 Anorexia: Secondary | ICD-10-CM | POA: Diagnosis not present

## 2018-05-23 DIAGNOSIS — G8100 Flaccid hemiplegia affecting unspecified side: Secondary | ICD-10-CM | POA: Diagnosis not present

## 2018-05-24 DIAGNOSIS — F329 Major depressive disorder, single episode, unspecified: Secondary | ICD-10-CM | POA: Diagnosis not present

## 2018-05-24 DIAGNOSIS — G4734 Idiopathic sleep related nonobstructive alveolar hypoventilation: Secondary | ICD-10-CM | POA: Diagnosis not present

## 2018-05-24 DIAGNOSIS — I69359 Hemiplegia and hemiparesis following cerebral infarction affecting unspecified side: Secondary | ICD-10-CM | POA: Diagnosis not present

## 2018-05-24 DIAGNOSIS — R630 Anorexia: Secondary | ICD-10-CM | POA: Diagnosis not present

## 2018-05-24 DIAGNOSIS — I6930 Unspecified sequelae of cerebral infarction: Secondary | ICD-10-CM | POA: Diagnosis not present

## 2018-05-24 DIAGNOSIS — G8100 Flaccid hemiplegia affecting unspecified side: Secondary | ICD-10-CM | POA: Diagnosis not present

## 2018-05-25 DIAGNOSIS — G8100 Flaccid hemiplegia affecting unspecified side: Secondary | ICD-10-CM | POA: Diagnosis not present

## 2018-05-25 DIAGNOSIS — G4734 Idiopathic sleep related nonobstructive alveolar hypoventilation: Secondary | ICD-10-CM | POA: Diagnosis not present

## 2018-05-25 DIAGNOSIS — F329 Major depressive disorder, single episode, unspecified: Secondary | ICD-10-CM | POA: Diagnosis not present

## 2018-05-25 DIAGNOSIS — R630 Anorexia: Secondary | ICD-10-CM | POA: Diagnosis not present

## 2018-05-25 DIAGNOSIS — I6930 Unspecified sequelae of cerebral infarction: Secondary | ICD-10-CM | POA: Diagnosis not present

## 2018-05-25 DIAGNOSIS — I69359 Hemiplegia and hemiparesis following cerebral infarction affecting unspecified side: Secondary | ICD-10-CM | POA: Diagnosis not present

## 2018-05-26 DIAGNOSIS — I2510 Atherosclerotic heart disease of native coronary artery without angina pectoris: Secondary | ICD-10-CM | POA: Diagnosis not present

## 2018-05-26 DIAGNOSIS — R531 Weakness: Secondary | ICD-10-CM | POA: Diagnosis not present

## 2018-05-26 DIAGNOSIS — F329 Major depressive disorder, single episode, unspecified: Secondary | ICD-10-CM | POA: Diagnosis not present

## 2018-05-26 DIAGNOSIS — I10 Essential (primary) hypertension: Secondary | ICD-10-CM | POA: Diagnosis not present

## 2018-05-26 DIAGNOSIS — M62838 Other muscle spasm: Secondary | ICD-10-CM | POA: Diagnosis not present

## 2018-05-26 DIAGNOSIS — G4734 Idiopathic sleep related nonobstructive alveolar hypoventilation: Secondary | ICD-10-CM | POA: Diagnosis not present

## 2018-05-26 DIAGNOSIS — R630 Anorexia: Secondary | ICD-10-CM | POA: Diagnosis not present

## 2018-05-26 DIAGNOSIS — I6930 Unspecified sequelae of cerebral infarction: Secondary | ICD-10-CM | POA: Diagnosis not present

## 2018-05-26 DIAGNOSIS — Z931 Gastrostomy status: Secondary | ICD-10-CM | POA: Diagnosis not present

## 2018-05-26 DIAGNOSIS — Z9981 Dependence on supplemental oxygen: Secondary | ICD-10-CM | POA: Diagnosis not present

## 2018-05-26 DIAGNOSIS — R1310 Dysphagia, unspecified: Secondary | ICD-10-CM | POA: Diagnosis not present

## 2018-05-26 DIAGNOSIS — Z9181 History of falling: Secondary | ICD-10-CM | POA: Diagnosis not present

## 2018-05-26 DIAGNOSIS — L89152 Pressure ulcer of sacral region, stage 2: Secondary | ICD-10-CM | POA: Diagnosis not present

## 2018-05-26 DIAGNOSIS — I69359 Hemiplegia and hemiparesis following cerebral infarction affecting unspecified side: Secondary | ICD-10-CM | POA: Diagnosis not present

## 2018-05-26 DIAGNOSIS — R0902 Hypoxemia: Secondary | ICD-10-CM | POA: Diagnosis not present

## 2018-05-26 DIAGNOSIS — G8100 Flaccid hemiplegia affecting unspecified side: Secondary | ICD-10-CM | POA: Diagnosis not present

## 2018-05-26 DIAGNOSIS — Z9049 Acquired absence of other specified parts of digestive tract: Secondary | ICD-10-CM | POA: Diagnosis not present

## 2018-05-26 DIAGNOSIS — R1312 Dysphagia, oropharyngeal phase: Secondary | ICD-10-CM | POA: Diagnosis not present

## 2018-05-28 DIAGNOSIS — G4734 Idiopathic sleep related nonobstructive alveolar hypoventilation: Secondary | ICD-10-CM | POA: Diagnosis not present

## 2018-05-28 DIAGNOSIS — F329 Major depressive disorder, single episode, unspecified: Secondary | ICD-10-CM | POA: Diagnosis not present

## 2018-05-28 DIAGNOSIS — G8100 Flaccid hemiplegia affecting unspecified side: Secondary | ICD-10-CM | POA: Diagnosis not present

## 2018-05-28 DIAGNOSIS — I6930 Unspecified sequelae of cerebral infarction: Secondary | ICD-10-CM | POA: Diagnosis not present

## 2018-05-28 DIAGNOSIS — I69359 Hemiplegia and hemiparesis following cerebral infarction affecting unspecified side: Secondary | ICD-10-CM | POA: Diagnosis not present

## 2018-05-28 DIAGNOSIS — R630 Anorexia: Secondary | ICD-10-CM | POA: Diagnosis not present

## 2018-05-29 DIAGNOSIS — G8100 Flaccid hemiplegia affecting unspecified side: Secondary | ICD-10-CM | POA: Diagnosis not present

## 2018-05-29 DIAGNOSIS — F329 Major depressive disorder, single episode, unspecified: Secondary | ICD-10-CM | POA: Diagnosis not present

## 2018-05-29 DIAGNOSIS — R630 Anorexia: Secondary | ICD-10-CM | POA: Diagnosis not present

## 2018-05-29 DIAGNOSIS — I6930 Unspecified sequelae of cerebral infarction: Secondary | ICD-10-CM | POA: Diagnosis not present

## 2018-05-29 DIAGNOSIS — I69359 Hemiplegia and hemiparesis following cerebral infarction affecting unspecified side: Secondary | ICD-10-CM | POA: Diagnosis not present

## 2018-05-29 DIAGNOSIS — G4734 Idiopathic sleep related nonobstructive alveolar hypoventilation: Secondary | ICD-10-CM | POA: Diagnosis not present

## 2018-05-30 DIAGNOSIS — I6930 Unspecified sequelae of cerebral infarction: Secondary | ICD-10-CM | POA: Diagnosis not present

## 2018-05-30 DIAGNOSIS — R630 Anorexia: Secondary | ICD-10-CM | POA: Diagnosis not present

## 2018-05-30 DIAGNOSIS — I69359 Hemiplegia and hemiparesis following cerebral infarction affecting unspecified side: Secondary | ICD-10-CM | POA: Diagnosis not present

## 2018-05-30 DIAGNOSIS — G8100 Flaccid hemiplegia affecting unspecified side: Secondary | ICD-10-CM | POA: Diagnosis not present

## 2018-05-30 DIAGNOSIS — F329 Major depressive disorder, single episode, unspecified: Secondary | ICD-10-CM | POA: Diagnosis not present

## 2018-05-30 DIAGNOSIS — G4734 Idiopathic sleep related nonobstructive alveolar hypoventilation: Secondary | ICD-10-CM | POA: Diagnosis not present

## 2018-05-31 DIAGNOSIS — R630 Anorexia: Secondary | ICD-10-CM | POA: Diagnosis not present

## 2018-05-31 DIAGNOSIS — I69359 Hemiplegia and hemiparesis following cerebral infarction affecting unspecified side: Secondary | ICD-10-CM | POA: Diagnosis not present

## 2018-05-31 DIAGNOSIS — F329 Major depressive disorder, single episode, unspecified: Secondary | ICD-10-CM | POA: Diagnosis not present

## 2018-05-31 DIAGNOSIS — G4734 Idiopathic sleep related nonobstructive alveolar hypoventilation: Secondary | ICD-10-CM | POA: Diagnosis not present

## 2018-05-31 DIAGNOSIS — I6930 Unspecified sequelae of cerebral infarction: Secondary | ICD-10-CM | POA: Diagnosis not present

## 2018-05-31 DIAGNOSIS — G8100 Flaccid hemiplegia affecting unspecified side: Secondary | ICD-10-CM | POA: Diagnosis not present

## 2018-06-01 DIAGNOSIS — R630 Anorexia: Secondary | ICD-10-CM | POA: Diagnosis not present

## 2018-06-01 DIAGNOSIS — I6930 Unspecified sequelae of cerebral infarction: Secondary | ICD-10-CM | POA: Diagnosis not present

## 2018-06-01 DIAGNOSIS — I69359 Hemiplegia and hemiparesis following cerebral infarction affecting unspecified side: Secondary | ICD-10-CM | POA: Diagnosis not present

## 2018-06-01 DIAGNOSIS — F329 Major depressive disorder, single episode, unspecified: Secondary | ICD-10-CM | POA: Diagnosis not present

## 2018-06-01 DIAGNOSIS — G4734 Idiopathic sleep related nonobstructive alveolar hypoventilation: Secondary | ICD-10-CM | POA: Diagnosis not present

## 2018-06-01 DIAGNOSIS — G8100 Flaccid hemiplegia affecting unspecified side: Secondary | ICD-10-CM | POA: Diagnosis not present

## 2018-06-04 DIAGNOSIS — I69359 Hemiplegia and hemiparesis following cerebral infarction affecting unspecified side: Secondary | ICD-10-CM | POA: Diagnosis not present

## 2018-06-04 DIAGNOSIS — G4734 Idiopathic sleep related nonobstructive alveolar hypoventilation: Secondary | ICD-10-CM | POA: Diagnosis not present

## 2018-06-04 DIAGNOSIS — G8100 Flaccid hemiplegia affecting unspecified side: Secondary | ICD-10-CM | POA: Diagnosis not present

## 2018-06-04 DIAGNOSIS — F329 Major depressive disorder, single episode, unspecified: Secondary | ICD-10-CM | POA: Diagnosis not present

## 2018-06-04 DIAGNOSIS — R630 Anorexia: Secondary | ICD-10-CM | POA: Diagnosis not present

## 2018-06-04 DIAGNOSIS — I6930 Unspecified sequelae of cerebral infarction: Secondary | ICD-10-CM | POA: Diagnosis not present

## 2018-06-05 DIAGNOSIS — G8100 Flaccid hemiplegia affecting unspecified side: Secondary | ICD-10-CM | POA: Diagnosis not present

## 2018-06-05 DIAGNOSIS — F329 Major depressive disorder, single episode, unspecified: Secondary | ICD-10-CM | POA: Diagnosis not present

## 2018-06-05 DIAGNOSIS — I69359 Hemiplegia and hemiparesis following cerebral infarction affecting unspecified side: Secondary | ICD-10-CM | POA: Diagnosis not present

## 2018-06-05 DIAGNOSIS — R630 Anorexia: Secondary | ICD-10-CM | POA: Diagnosis not present

## 2018-06-05 DIAGNOSIS — G4734 Idiopathic sleep related nonobstructive alveolar hypoventilation: Secondary | ICD-10-CM | POA: Diagnosis not present

## 2018-06-05 DIAGNOSIS — I6930 Unspecified sequelae of cerebral infarction: Secondary | ICD-10-CM | POA: Diagnosis not present

## 2018-06-06 DIAGNOSIS — G8100 Flaccid hemiplegia affecting unspecified side: Secondary | ICD-10-CM | POA: Diagnosis not present

## 2018-06-06 DIAGNOSIS — F329 Major depressive disorder, single episode, unspecified: Secondary | ICD-10-CM | POA: Diagnosis not present

## 2018-06-06 DIAGNOSIS — G4734 Idiopathic sleep related nonobstructive alveolar hypoventilation: Secondary | ICD-10-CM | POA: Diagnosis not present

## 2018-06-06 DIAGNOSIS — R630 Anorexia: Secondary | ICD-10-CM | POA: Diagnosis not present

## 2018-06-06 DIAGNOSIS — I6930 Unspecified sequelae of cerebral infarction: Secondary | ICD-10-CM | POA: Diagnosis not present

## 2018-06-06 DIAGNOSIS — I69359 Hemiplegia and hemiparesis following cerebral infarction affecting unspecified side: Secondary | ICD-10-CM | POA: Diagnosis not present

## 2018-06-07 DIAGNOSIS — G4734 Idiopathic sleep related nonobstructive alveolar hypoventilation: Secondary | ICD-10-CM | POA: Diagnosis not present

## 2018-06-07 DIAGNOSIS — F329 Major depressive disorder, single episode, unspecified: Secondary | ICD-10-CM | POA: Diagnosis not present

## 2018-06-07 DIAGNOSIS — G8100 Flaccid hemiplegia affecting unspecified side: Secondary | ICD-10-CM | POA: Diagnosis not present

## 2018-06-07 DIAGNOSIS — R630 Anorexia: Secondary | ICD-10-CM | POA: Diagnosis not present

## 2018-06-07 DIAGNOSIS — I6930 Unspecified sequelae of cerebral infarction: Secondary | ICD-10-CM | POA: Diagnosis not present

## 2018-06-07 DIAGNOSIS — I69359 Hemiplegia and hemiparesis following cerebral infarction affecting unspecified side: Secondary | ICD-10-CM | POA: Diagnosis not present

## 2018-06-08 DIAGNOSIS — F329 Major depressive disorder, single episode, unspecified: Secondary | ICD-10-CM | POA: Diagnosis not present

## 2018-06-08 DIAGNOSIS — I6930 Unspecified sequelae of cerebral infarction: Secondary | ICD-10-CM | POA: Diagnosis not present

## 2018-06-08 DIAGNOSIS — I69359 Hemiplegia and hemiparesis following cerebral infarction affecting unspecified side: Secondary | ICD-10-CM | POA: Diagnosis not present

## 2018-06-08 DIAGNOSIS — G4734 Idiopathic sleep related nonobstructive alveolar hypoventilation: Secondary | ICD-10-CM | POA: Diagnosis not present

## 2018-06-08 DIAGNOSIS — G8100 Flaccid hemiplegia affecting unspecified side: Secondary | ICD-10-CM | POA: Diagnosis not present

## 2018-06-08 DIAGNOSIS — R630 Anorexia: Secondary | ICD-10-CM | POA: Diagnosis not present

## 2018-06-11 DIAGNOSIS — I69359 Hemiplegia and hemiparesis following cerebral infarction affecting unspecified side: Secondary | ICD-10-CM | POA: Diagnosis not present

## 2018-06-11 DIAGNOSIS — I6930 Unspecified sequelae of cerebral infarction: Secondary | ICD-10-CM | POA: Diagnosis not present

## 2018-06-11 DIAGNOSIS — R630 Anorexia: Secondary | ICD-10-CM | POA: Diagnosis not present

## 2018-06-11 DIAGNOSIS — F329 Major depressive disorder, single episode, unspecified: Secondary | ICD-10-CM | POA: Diagnosis not present

## 2018-06-11 DIAGNOSIS — G4734 Idiopathic sleep related nonobstructive alveolar hypoventilation: Secondary | ICD-10-CM | POA: Diagnosis not present

## 2018-06-11 DIAGNOSIS — G8100 Flaccid hemiplegia affecting unspecified side: Secondary | ICD-10-CM | POA: Diagnosis not present

## 2018-06-12 DIAGNOSIS — I69359 Hemiplegia and hemiparesis following cerebral infarction affecting unspecified side: Secondary | ICD-10-CM | POA: Diagnosis not present

## 2018-06-12 DIAGNOSIS — F329 Major depressive disorder, single episode, unspecified: Secondary | ICD-10-CM | POA: Diagnosis not present

## 2018-06-12 DIAGNOSIS — G4734 Idiopathic sleep related nonobstructive alveolar hypoventilation: Secondary | ICD-10-CM | POA: Diagnosis not present

## 2018-06-12 DIAGNOSIS — R630 Anorexia: Secondary | ICD-10-CM | POA: Diagnosis not present

## 2018-06-12 DIAGNOSIS — G8100 Flaccid hemiplegia affecting unspecified side: Secondary | ICD-10-CM | POA: Diagnosis not present

## 2018-06-12 DIAGNOSIS — I6930 Unspecified sequelae of cerebral infarction: Secondary | ICD-10-CM | POA: Diagnosis not present

## 2018-06-13 DIAGNOSIS — I6930 Unspecified sequelae of cerebral infarction: Secondary | ICD-10-CM | POA: Diagnosis not present

## 2018-06-13 DIAGNOSIS — G8100 Flaccid hemiplegia affecting unspecified side: Secondary | ICD-10-CM | POA: Diagnosis not present

## 2018-06-13 DIAGNOSIS — R630 Anorexia: Secondary | ICD-10-CM | POA: Diagnosis not present

## 2018-06-13 DIAGNOSIS — I69359 Hemiplegia and hemiparesis following cerebral infarction affecting unspecified side: Secondary | ICD-10-CM | POA: Diagnosis not present

## 2018-06-13 DIAGNOSIS — F329 Major depressive disorder, single episode, unspecified: Secondary | ICD-10-CM | POA: Diagnosis not present

## 2018-06-13 DIAGNOSIS — G4734 Idiopathic sleep related nonobstructive alveolar hypoventilation: Secondary | ICD-10-CM | POA: Diagnosis not present

## 2018-06-14 DIAGNOSIS — I69359 Hemiplegia and hemiparesis following cerebral infarction affecting unspecified side: Secondary | ICD-10-CM | POA: Diagnosis not present

## 2018-06-14 DIAGNOSIS — G8100 Flaccid hemiplegia affecting unspecified side: Secondary | ICD-10-CM | POA: Diagnosis not present

## 2018-06-14 DIAGNOSIS — I6930 Unspecified sequelae of cerebral infarction: Secondary | ICD-10-CM | POA: Diagnosis not present

## 2018-06-14 DIAGNOSIS — F329 Major depressive disorder, single episode, unspecified: Secondary | ICD-10-CM | POA: Diagnosis not present

## 2018-06-14 DIAGNOSIS — G4734 Idiopathic sleep related nonobstructive alveolar hypoventilation: Secondary | ICD-10-CM | POA: Diagnosis not present

## 2018-06-14 DIAGNOSIS — R630 Anorexia: Secondary | ICD-10-CM | POA: Diagnosis not present

## 2018-06-16 DIAGNOSIS — R630 Anorexia: Secondary | ICD-10-CM | POA: Diagnosis not present

## 2018-06-16 DIAGNOSIS — I6930 Unspecified sequelae of cerebral infarction: Secondary | ICD-10-CM | POA: Diagnosis not present

## 2018-06-16 DIAGNOSIS — G4734 Idiopathic sleep related nonobstructive alveolar hypoventilation: Secondary | ICD-10-CM | POA: Diagnosis not present

## 2018-06-16 DIAGNOSIS — G8100 Flaccid hemiplegia affecting unspecified side: Secondary | ICD-10-CM | POA: Diagnosis not present

## 2018-06-16 DIAGNOSIS — I69359 Hemiplegia and hemiparesis following cerebral infarction affecting unspecified side: Secondary | ICD-10-CM | POA: Diagnosis not present

## 2018-06-16 DIAGNOSIS — F329 Major depressive disorder, single episode, unspecified: Secondary | ICD-10-CM | POA: Diagnosis not present

## 2018-06-18 DIAGNOSIS — F329 Major depressive disorder, single episode, unspecified: Secondary | ICD-10-CM | POA: Diagnosis not present

## 2018-06-18 DIAGNOSIS — I69359 Hemiplegia and hemiparesis following cerebral infarction affecting unspecified side: Secondary | ICD-10-CM | POA: Diagnosis not present

## 2018-06-18 DIAGNOSIS — G8100 Flaccid hemiplegia affecting unspecified side: Secondary | ICD-10-CM | POA: Diagnosis not present

## 2018-06-18 DIAGNOSIS — R630 Anorexia: Secondary | ICD-10-CM | POA: Diagnosis not present

## 2018-06-18 DIAGNOSIS — G4734 Idiopathic sleep related nonobstructive alveolar hypoventilation: Secondary | ICD-10-CM | POA: Diagnosis not present

## 2018-06-18 DIAGNOSIS — I6930 Unspecified sequelae of cerebral infarction: Secondary | ICD-10-CM | POA: Diagnosis not present

## 2018-06-19 DIAGNOSIS — G8100 Flaccid hemiplegia affecting unspecified side: Secondary | ICD-10-CM | POA: Diagnosis not present

## 2018-06-19 DIAGNOSIS — I69359 Hemiplegia and hemiparesis following cerebral infarction affecting unspecified side: Secondary | ICD-10-CM | POA: Diagnosis not present

## 2018-06-19 DIAGNOSIS — F329 Major depressive disorder, single episode, unspecified: Secondary | ICD-10-CM | POA: Diagnosis not present

## 2018-06-19 DIAGNOSIS — R630 Anorexia: Secondary | ICD-10-CM | POA: Diagnosis not present

## 2018-06-19 DIAGNOSIS — G4734 Idiopathic sleep related nonobstructive alveolar hypoventilation: Secondary | ICD-10-CM | POA: Diagnosis not present

## 2018-06-19 DIAGNOSIS — I6930 Unspecified sequelae of cerebral infarction: Secondary | ICD-10-CM | POA: Diagnosis not present

## 2018-06-20 DIAGNOSIS — G4734 Idiopathic sleep related nonobstructive alveolar hypoventilation: Secondary | ICD-10-CM | POA: Diagnosis not present

## 2018-06-20 DIAGNOSIS — I69359 Hemiplegia and hemiparesis following cerebral infarction affecting unspecified side: Secondary | ICD-10-CM | POA: Diagnosis not present

## 2018-06-20 DIAGNOSIS — R630 Anorexia: Secondary | ICD-10-CM | POA: Diagnosis not present

## 2018-06-20 DIAGNOSIS — F329 Major depressive disorder, single episode, unspecified: Secondary | ICD-10-CM | POA: Diagnosis not present

## 2018-06-20 DIAGNOSIS — G8100 Flaccid hemiplegia affecting unspecified side: Secondary | ICD-10-CM | POA: Diagnosis not present

## 2018-06-20 DIAGNOSIS — I6930 Unspecified sequelae of cerebral infarction: Secondary | ICD-10-CM | POA: Diagnosis not present

## 2018-06-21 DIAGNOSIS — G4734 Idiopathic sleep related nonobstructive alveolar hypoventilation: Secondary | ICD-10-CM | POA: Diagnosis not present

## 2018-06-21 DIAGNOSIS — G8100 Flaccid hemiplegia affecting unspecified side: Secondary | ICD-10-CM | POA: Diagnosis not present

## 2018-06-21 DIAGNOSIS — R630 Anorexia: Secondary | ICD-10-CM | POA: Diagnosis not present

## 2018-06-21 DIAGNOSIS — I69359 Hemiplegia and hemiparesis following cerebral infarction affecting unspecified side: Secondary | ICD-10-CM | POA: Diagnosis not present

## 2018-06-21 DIAGNOSIS — F329 Major depressive disorder, single episode, unspecified: Secondary | ICD-10-CM | POA: Diagnosis not present

## 2018-06-21 DIAGNOSIS — I6930 Unspecified sequelae of cerebral infarction: Secondary | ICD-10-CM | POA: Diagnosis not present

## 2018-06-22 DIAGNOSIS — G8100 Flaccid hemiplegia affecting unspecified side: Secondary | ICD-10-CM | POA: Diagnosis not present

## 2018-06-22 DIAGNOSIS — F329 Major depressive disorder, single episode, unspecified: Secondary | ICD-10-CM | POA: Diagnosis not present

## 2018-06-22 DIAGNOSIS — G4734 Idiopathic sleep related nonobstructive alveolar hypoventilation: Secondary | ICD-10-CM | POA: Diagnosis not present

## 2018-06-22 DIAGNOSIS — I6930 Unspecified sequelae of cerebral infarction: Secondary | ICD-10-CM | POA: Diagnosis not present

## 2018-06-22 DIAGNOSIS — I69359 Hemiplegia and hemiparesis following cerebral infarction affecting unspecified side: Secondary | ICD-10-CM | POA: Diagnosis not present

## 2018-06-22 DIAGNOSIS — R630 Anorexia: Secondary | ICD-10-CM | POA: Diagnosis not present

## 2018-06-24 DIAGNOSIS — G4734 Idiopathic sleep related nonobstructive alveolar hypoventilation: Secondary | ICD-10-CM | POA: Diagnosis not present

## 2018-06-24 DIAGNOSIS — M62838 Other muscle spasm: Secondary | ICD-10-CM | POA: Diagnosis not present

## 2018-06-24 DIAGNOSIS — I69359 Hemiplegia and hemiparesis following cerebral infarction affecting unspecified side: Secondary | ICD-10-CM | POA: Diagnosis not present

## 2018-06-24 DIAGNOSIS — G8100 Flaccid hemiplegia affecting unspecified side: Secondary | ICD-10-CM | POA: Diagnosis not present

## 2018-06-24 DIAGNOSIS — F329 Major depressive disorder, single episode, unspecified: Secondary | ICD-10-CM | POA: Diagnosis not present

## 2018-06-24 DIAGNOSIS — R630 Anorexia: Secondary | ICD-10-CM | POA: Diagnosis not present

## 2018-06-24 DIAGNOSIS — R1312 Dysphagia, oropharyngeal phase: Secondary | ICD-10-CM | POA: Diagnosis not present

## 2018-06-24 DIAGNOSIS — Z9181 History of falling: Secondary | ICD-10-CM | POA: Diagnosis not present

## 2018-06-24 DIAGNOSIS — Z931 Gastrostomy status: Secondary | ICD-10-CM | POA: Diagnosis not present

## 2018-06-24 DIAGNOSIS — R531 Weakness: Secondary | ICD-10-CM | POA: Diagnosis not present

## 2018-06-24 DIAGNOSIS — J189 Pneumonia, unspecified organism: Secondary | ICD-10-CM | POA: Diagnosis not present

## 2018-06-24 DIAGNOSIS — R1310 Dysphagia, unspecified: Secondary | ICD-10-CM | POA: Diagnosis not present

## 2018-06-24 DIAGNOSIS — L89152 Pressure ulcer of sacral region, stage 2: Secondary | ICD-10-CM | POA: Diagnosis not present

## 2018-06-24 DIAGNOSIS — I6930 Unspecified sequelae of cerebral infarction: Secondary | ICD-10-CM | POA: Diagnosis not present

## 2018-06-24 DIAGNOSIS — Z9049 Acquired absence of other specified parts of digestive tract: Secondary | ICD-10-CM | POA: Diagnosis not present

## 2018-06-24 DIAGNOSIS — R0902 Hypoxemia: Secondary | ICD-10-CM | POA: Diagnosis not present

## 2018-06-24 DIAGNOSIS — I10 Essential (primary) hypertension: Secondary | ICD-10-CM | POA: Diagnosis not present

## 2018-06-24 DIAGNOSIS — Z9981 Dependence on supplemental oxygen: Secondary | ICD-10-CM | POA: Diagnosis not present

## 2018-06-25 DIAGNOSIS — I69359 Hemiplegia and hemiparesis following cerebral infarction affecting unspecified side: Secondary | ICD-10-CM | POA: Diagnosis not present

## 2018-06-25 DIAGNOSIS — I6930 Unspecified sequelae of cerebral infarction: Secondary | ICD-10-CM | POA: Diagnosis not present

## 2018-06-25 DIAGNOSIS — R630 Anorexia: Secondary | ICD-10-CM | POA: Diagnosis not present

## 2018-06-25 DIAGNOSIS — F329 Major depressive disorder, single episode, unspecified: Secondary | ICD-10-CM | POA: Diagnosis not present

## 2018-06-25 DIAGNOSIS — G4734 Idiopathic sleep related nonobstructive alveolar hypoventilation: Secondary | ICD-10-CM | POA: Diagnosis not present

## 2018-06-25 DIAGNOSIS — J189 Pneumonia, unspecified organism: Secondary | ICD-10-CM | POA: Diagnosis not present

## 2018-06-26 DIAGNOSIS — J189 Pneumonia, unspecified organism: Secondary | ICD-10-CM | POA: Diagnosis not present

## 2018-06-26 DIAGNOSIS — I69359 Hemiplegia and hemiparesis following cerebral infarction affecting unspecified side: Secondary | ICD-10-CM | POA: Diagnosis not present

## 2018-06-26 DIAGNOSIS — R630 Anorexia: Secondary | ICD-10-CM | POA: Diagnosis not present

## 2018-06-26 DIAGNOSIS — F329 Major depressive disorder, single episode, unspecified: Secondary | ICD-10-CM | POA: Diagnosis not present

## 2018-06-26 DIAGNOSIS — G4734 Idiopathic sleep related nonobstructive alveolar hypoventilation: Secondary | ICD-10-CM | POA: Diagnosis not present

## 2018-06-26 DIAGNOSIS — I6930 Unspecified sequelae of cerebral infarction: Secondary | ICD-10-CM | POA: Diagnosis not present

## 2018-06-27 DIAGNOSIS — I69359 Hemiplegia and hemiparesis following cerebral infarction affecting unspecified side: Secondary | ICD-10-CM | POA: Diagnosis not present

## 2018-06-27 DIAGNOSIS — R630 Anorexia: Secondary | ICD-10-CM | POA: Diagnosis not present

## 2018-06-27 DIAGNOSIS — I6930 Unspecified sequelae of cerebral infarction: Secondary | ICD-10-CM | POA: Diagnosis not present

## 2018-06-27 DIAGNOSIS — G4734 Idiopathic sleep related nonobstructive alveolar hypoventilation: Secondary | ICD-10-CM | POA: Diagnosis not present

## 2018-06-27 DIAGNOSIS — F329 Major depressive disorder, single episode, unspecified: Secondary | ICD-10-CM | POA: Diagnosis not present

## 2018-06-27 DIAGNOSIS — J189 Pneumonia, unspecified organism: Secondary | ICD-10-CM | POA: Diagnosis not present

## 2018-06-28 DIAGNOSIS — I69359 Hemiplegia and hemiparesis following cerebral infarction affecting unspecified side: Secondary | ICD-10-CM | POA: Diagnosis not present

## 2018-06-28 DIAGNOSIS — R630 Anorexia: Secondary | ICD-10-CM | POA: Diagnosis not present

## 2018-06-28 DIAGNOSIS — F329 Major depressive disorder, single episode, unspecified: Secondary | ICD-10-CM | POA: Diagnosis not present

## 2018-06-28 DIAGNOSIS — J189 Pneumonia, unspecified organism: Secondary | ICD-10-CM | POA: Diagnosis not present

## 2018-06-28 DIAGNOSIS — G4734 Idiopathic sleep related nonobstructive alveolar hypoventilation: Secondary | ICD-10-CM | POA: Diagnosis not present

## 2018-06-28 DIAGNOSIS — I6930 Unspecified sequelae of cerebral infarction: Secondary | ICD-10-CM | POA: Diagnosis not present

## 2018-06-29 DIAGNOSIS — F329 Major depressive disorder, single episode, unspecified: Secondary | ICD-10-CM | POA: Diagnosis not present

## 2018-06-29 DIAGNOSIS — I69359 Hemiplegia and hemiparesis following cerebral infarction affecting unspecified side: Secondary | ICD-10-CM | POA: Diagnosis not present

## 2018-06-29 DIAGNOSIS — R630 Anorexia: Secondary | ICD-10-CM | POA: Diagnosis not present

## 2018-06-29 DIAGNOSIS — I6930 Unspecified sequelae of cerebral infarction: Secondary | ICD-10-CM | POA: Diagnosis not present

## 2018-06-29 DIAGNOSIS — J189 Pneumonia, unspecified organism: Secondary | ICD-10-CM | POA: Diagnosis not present

## 2018-06-29 DIAGNOSIS — G4734 Idiopathic sleep related nonobstructive alveolar hypoventilation: Secondary | ICD-10-CM | POA: Diagnosis not present

## 2018-07-02 DIAGNOSIS — J189 Pneumonia, unspecified organism: Secondary | ICD-10-CM | POA: Diagnosis not present

## 2018-07-02 DIAGNOSIS — G4734 Idiopathic sleep related nonobstructive alveolar hypoventilation: Secondary | ICD-10-CM | POA: Diagnosis not present

## 2018-07-02 DIAGNOSIS — I69359 Hemiplegia and hemiparesis following cerebral infarction affecting unspecified side: Secondary | ICD-10-CM | POA: Diagnosis not present

## 2018-07-02 DIAGNOSIS — I6930 Unspecified sequelae of cerebral infarction: Secondary | ICD-10-CM | POA: Diagnosis not present

## 2018-07-02 DIAGNOSIS — R630 Anorexia: Secondary | ICD-10-CM | POA: Diagnosis not present

## 2018-07-02 DIAGNOSIS — F329 Major depressive disorder, single episode, unspecified: Secondary | ICD-10-CM | POA: Diagnosis not present

## 2018-07-03 DIAGNOSIS — I6930 Unspecified sequelae of cerebral infarction: Secondary | ICD-10-CM | POA: Diagnosis not present

## 2018-07-03 DIAGNOSIS — J189 Pneumonia, unspecified organism: Secondary | ICD-10-CM | POA: Diagnosis not present

## 2018-07-03 DIAGNOSIS — I69359 Hemiplegia and hemiparesis following cerebral infarction affecting unspecified side: Secondary | ICD-10-CM | POA: Diagnosis not present

## 2018-07-03 DIAGNOSIS — F329 Major depressive disorder, single episode, unspecified: Secondary | ICD-10-CM | POA: Diagnosis not present

## 2018-07-03 DIAGNOSIS — R630 Anorexia: Secondary | ICD-10-CM | POA: Diagnosis not present

## 2018-07-03 DIAGNOSIS — G4734 Idiopathic sleep related nonobstructive alveolar hypoventilation: Secondary | ICD-10-CM | POA: Diagnosis not present

## 2018-07-04 DIAGNOSIS — G4734 Idiopathic sleep related nonobstructive alveolar hypoventilation: Secondary | ICD-10-CM | POA: Diagnosis not present

## 2018-07-04 DIAGNOSIS — I6930 Unspecified sequelae of cerebral infarction: Secondary | ICD-10-CM | POA: Diagnosis not present

## 2018-07-04 DIAGNOSIS — F329 Major depressive disorder, single episode, unspecified: Secondary | ICD-10-CM | POA: Diagnosis not present

## 2018-07-04 DIAGNOSIS — J189 Pneumonia, unspecified organism: Secondary | ICD-10-CM | POA: Diagnosis not present

## 2018-07-04 DIAGNOSIS — I69359 Hemiplegia and hemiparesis following cerebral infarction affecting unspecified side: Secondary | ICD-10-CM | POA: Diagnosis not present

## 2018-07-04 DIAGNOSIS — R630 Anorexia: Secondary | ICD-10-CM | POA: Diagnosis not present

## 2018-07-05 DIAGNOSIS — J189 Pneumonia, unspecified organism: Secondary | ICD-10-CM | POA: Diagnosis not present

## 2018-07-05 DIAGNOSIS — G4734 Idiopathic sleep related nonobstructive alveolar hypoventilation: Secondary | ICD-10-CM | POA: Diagnosis not present

## 2018-07-05 DIAGNOSIS — I69359 Hemiplegia and hemiparesis following cerebral infarction affecting unspecified side: Secondary | ICD-10-CM | POA: Diagnosis not present

## 2018-07-05 DIAGNOSIS — F329 Major depressive disorder, single episode, unspecified: Secondary | ICD-10-CM | POA: Diagnosis not present

## 2018-07-05 DIAGNOSIS — I6930 Unspecified sequelae of cerebral infarction: Secondary | ICD-10-CM | POA: Diagnosis not present

## 2018-07-05 DIAGNOSIS — R630 Anorexia: Secondary | ICD-10-CM | POA: Diagnosis not present

## 2018-07-06 DIAGNOSIS — F329 Major depressive disorder, single episode, unspecified: Secondary | ICD-10-CM | POA: Diagnosis not present

## 2018-07-06 DIAGNOSIS — G4734 Idiopathic sleep related nonobstructive alveolar hypoventilation: Secondary | ICD-10-CM | POA: Diagnosis not present

## 2018-07-06 DIAGNOSIS — R630 Anorexia: Secondary | ICD-10-CM | POA: Diagnosis not present

## 2018-07-06 DIAGNOSIS — J189 Pneumonia, unspecified organism: Secondary | ICD-10-CM | POA: Diagnosis not present

## 2018-07-06 DIAGNOSIS — I69359 Hemiplegia and hemiparesis following cerebral infarction affecting unspecified side: Secondary | ICD-10-CM | POA: Diagnosis not present

## 2018-07-06 DIAGNOSIS — I6930 Unspecified sequelae of cerebral infarction: Secondary | ICD-10-CM | POA: Diagnosis not present

## 2018-07-09 DIAGNOSIS — G4734 Idiopathic sleep related nonobstructive alveolar hypoventilation: Secondary | ICD-10-CM | POA: Diagnosis not present

## 2018-07-09 DIAGNOSIS — I69359 Hemiplegia and hemiparesis following cerebral infarction affecting unspecified side: Secondary | ICD-10-CM | POA: Diagnosis not present

## 2018-07-09 DIAGNOSIS — J189 Pneumonia, unspecified organism: Secondary | ICD-10-CM | POA: Diagnosis not present

## 2018-07-09 DIAGNOSIS — R630 Anorexia: Secondary | ICD-10-CM | POA: Diagnosis not present

## 2018-07-09 DIAGNOSIS — F329 Major depressive disorder, single episode, unspecified: Secondary | ICD-10-CM | POA: Diagnosis not present

## 2018-07-09 DIAGNOSIS — I6930 Unspecified sequelae of cerebral infarction: Secondary | ICD-10-CM | POA: Diagnosis not present

## 2018-07-10 DIAGNOSIS — G4734 Idiopathic sleep related nonobstructive alveolar hypoventilation: Secondary | ICD-10-CM | POA: Diagnosis not present

## 2018-07-10 DIAGNOSIS — I6930 Unspecified sequelae of cerebral infarction: Secondary | ICD-10-CM | POA: Diagnosis not present

## 2018-07-10 DIAGNOSIS — J189 Pneumonia, unspecified organism: Secondary | ICD-10-CM | POA: Diagnosis not present

## 2018-07-10 DIAGNOSIS — R630 Anorexia: Secondary | ICD-10-CM | POA: Diagnosis not present

## 2018-07-10 DIAGNOSIS — I69359 Hemiplegia and hemiparesis following cerebral infarction affecting unspecified side: Secondary | ICD-10-CM | POA: Diagnosis not present

## 2018-07-10 DIAGNOSIS — F329 Major depressive disorder, single episode, unspecified: Secondary | ICD-10-CM | POA: Diagnosis not present

## 2018-07-11 DIAGNOSIS — G4734 Idiopathic sleep related nonobstructive alveolar hypoventilation: Secondary | ICD-10-CM | POA: Diagnosis not present

## 2018-07-11 DIAGNOSIS — R630 Anorexia: Secondary | ICD-10-CM | POA: Diagnosis not present

## 2018-07-11 DIAGNOSIS — J189 Pneumonia, unspecified organism: Secondary | ICD-10-CM | POA: Diagnosis not present

## 2018-07-11 DIAGNOSIS — I6930 Unspecified sequelae of cerebral infarction: Secondary | ICD-10-CM | POA: Diagnosis not present

## 2018-07-11 DIAGNOSIS — I69359 Hemiplegia and hemiparesis following cerebral infarction affecting unspecified side: Secondary | ICD-10-CM | POA: Diagnosis not present

## 2018-07-11 DIAGNOSIS — F329 Major depressive disorder, single episode, unspecified: Secondary | ICD-10-CM | POA: Diagnosis not present

## 2018-07-12 DIAGNOSIS — R630 Anorexia: Secondary | ICD-10-CM | POA: Diagnosis not present

## 2018-07-12 DIAGNOSIS — I69359 Hemiplegia and hemiparesis following cerebral infarction affecting unspecified side: Secondary | ICD-10-CM | POA: Diagnosis not present

## 2018-07-12 DIAGNOSIS — J189 Pneumonia, unspecified organism: Secondary | ICD-10-CM | POA: Diagnosis not present

## 2018-07-12 DIAGNOSIS — I6930 Unspecified sequelae of cerebral infarction: Secondary | ICD-10-CM | POA: Diagnosis not present

## 2018-07-12 DIAGNOSIS — G4734 Idiopathic sleep related nonobstructive alveolar hypoventilation: Secondary | ICD-10-CM | POA: Diagnosis not present

## 2018-07-12 DIAGNOSIS — F329 Major depressive disorder, single episode, unspecified: Secondary | ICD-10-CM | POA: Diagnosis not present

## 2018-07-13 DIAGNOSIS — F329 Major depressive disorder, single episode, unspecified: Secondary | ICD-10-CM | POA: Diagnosis not present

## 2018-07-13 DIAGNOSIS — I6930 Unspecified sequelae of cerebral infarction: Secondary | ICD-10-CM | POA: Diagnosis not present

## 2018-07-13 DIAGNOSIS — I69359 Hemiplegia and hemiparesis following cerebral infarction affecting unspecified side: Secondary | ICD-10-CM | POA: Diagnosis not present

## 2018-07-13 DIAGNOSIS — J189 Pneumonia, unspecified organism: Secondary | ICD-10-CM | POA: Diagnosis not present

## 2018-07-13 DIAGNOSIS — G4734 Idiopathic sleep related nonobstructive alveolar hypoventilation: Secondary | ICD-10-CM | POA: Diagnosis not present

## 2018-07-13 DIAGNOSIS — R630 Anorexia: Secondary | ICD-10-CM | POA: Diagnosis not present

## 2018-07-14 DIAGNOSIS — G4734 Idiopathic sleep related nonobstructive alveolar hypoventilation: Secondary | ICD-10-CM | POA: Diagnosis not present

## 2018-07-14 DIAGNOSIS — R630 Anorexia: Secondary | ICD-10-CM | POA: Diagnosis not present

## 2018-07-14 DIAGNOSIS — I6930 Unspecified sequelae of cerebral infarction: Secondary | ICD-10-CM | POA: Diagnosis not present

## 2018-07-14 DIAGNOSIS — J189 Pneumonia, unspecified organism: Secondary | ICD-10-CM | POA: Diagnosis not present

## 2018-07-14 DIAGNOSIS — I69359 Hemiplegia and hemiparesis following cerebral infarction affecting unspecified side: Secondary | ICD-10-CM | POA: Diagnosis not present

## 2018-07-14 DIAGNOSIS — F329 Major depressive disorder, single episode, unspecified: Secondary | ICD-10-CM | POA: Diagnosis not present

## 2018-07-16 DIAGNOSIS — R630 Anorexia: Secondary | ICD-10-CM | POA: Diagnosis not present

## 2018-07-16 DIAGNOSIS — F329 Major depressive disorder, single episode, unspecified: Secondary | ICD-10-CM | POA: Diagnosis not present

## 2018-07-16 DIAGNOSIS — I69359 Hemiplegia and hemiparesis following cerebral infarction affecting unspecified side: Secondary | ICD-10-CM | POA: Diagnosis not present

## 2018-07-16 DIAGNOSIS — I6930 Unspecified sequelae of cerebral infarction: Secondary | ICD-10-CM | POA: Diagnosis not present

## 2018-07-16 DIAGNOSIS — J189 Pneumonia, unspecified organism: Secondary | ICD-10-CM | POA: Diagnosis not present

## 2018-07-16 DIAGNOSIS — G4734 Idiopathic sleep related nonobstructive alveolar hypoventilation: Secondary | ICD-10-CM | POA: Diagnosis not present

## 2018-07-17 DIAGNOSIS — I6930 Unspecified sequelae of cerebral infarction: Secondary | ICD-10-CM | POA: Diagnosis not present

## 2018-07-17 DIAGNOSIS — F329 Major depressive disorder, single episode, unspecified: Secondary | ICD-10-CM | POA: Diagnosis not present

## 2018-07-17 DIAGNOSIS — I69359 Hemiplegia and hemiparesis following cerebral infarction affecting unspecified side: Secondary | ICD-10-CM | POA: Diagnosis not present

## 2018-07-17 DIAGNOSIS — R630 Anorexia: Secondary | ICD-10-CM | POA: Diagnosis not present

## 2018-07-17 DIAGNOSIS — J189 Pneumonia, unspecified organism: Secondary | ICD-10-CM | POA: Diagnosis not present

## 2018-07-17 DIAGNOSIS — G4734 Idiopathic sleep related nonobstructive alveolar hypoventilation: Secondary | ICD-10-CM | POA: Diagnosis not present

## 2018-07-18 DIAGNOSIS — F329 Major depressive disorder, single episode, unspecified: Secondary | ICD-10-CM | POA: Diagnosis not present

## 2018-07-18 DIAGNOSIS — I69359 Hemiplegia and hemiparesis following cerebral infarction affecting unspecified side: Secondary | ICD-10-CM | POA: Diagnosis not present

## 2018-07-18 DIAGNOSIS — R630 Anorexia: Secondary | ICD-10-CM | POA: Diagnosis not present

## 2018-07-18 DIAGNOSIS — G4734 Idiopathic sleep related nonobstructive alveolar hypoventilation: Secondary | ICD-10-CM | POA: Diagnosis not present

## 2018-07-18 DIAGNOSIS — I6930 Unspecified sequelae of cerebral infarction: Secondary | ICD-10-CM | POA: Diagnosis not present

## 2018-07-18 DIAGNOSIS — J189 Pneumonia, unspecified organism: Secondary | ICD-10-CM | POA: Diagnosis not present

## 2018-07-19 DIAGNOSIS — J189 Pneumonia, unspecified organism: Secondary | ICD-10-CM | POA: Diagnosis not present

## 2018-07-19 DIAGNOSIS — I69359 Hemiplegia and hemiparesis following cerebral infarction affecting unspecified side: Secondary | ICD-10-CM | POA: Diagnosis not present

## 2018-07-19 DIAGNOSIS — G4734 Idiopathic sleep related nonobstructive alveolar hypoventilation: Secondary | ICD-10-CM | POA: Diagnosis not present

## 2018-07-19 DIAGNOSIS — I6930 Unspecified sequelae of cerebral infarction: Secondary | ICD-10-CM | POA: Diagnosis not present

## 2018-07-19 DIAGNOSIS — F329 Major depressive disorder, single episode, unspecified: Secondary | ICD-10-CM | POA: Diagnosis not present

## 2018-07-19 DIAGNOSIS — R630 Anorexia: Secondary | ICD-10-CM | POA: Diagnosis not present

## 2018-07-20 DIAGNOSIS — I69359 Hemiplegia and hemiparesis following cerebral infarction affecting unspecified side: Secondary | ICD-10-CM | POA: Diagnosis not present

## 2018-07-20 DIAGNOSIS — R630 Anorexia: Secondary | ICD-10-CM | POA: Diagnosis not present

## 2018-07-20 DIAGNOSIS — F329 Major depressive disorder, single episode, unspecified: Secondary | ICD-10-CM | POA: Diagnosis not present

## 2018-07-20 DIAGNOSIS — I6930 Unspecified sequelae of cerebral infarction: Secondary | ICD-10-CM | POA: Diagnosis not present

## 2018-07-20 DIAGNOSIS — G4734 Idiopathic sleep related nonobstructive alveolar hypoventilation: Secondary | ICD-10-CM | POA: Diagnosis not present

## 2018-07-20 DIAGNOSIS — J189 Pneumonia, unspecified organism: Secondary | ICD-10-CM | POA: Diagnosis not present

## 2018-07-23 DIAGNOSIS — R630 Anorexia: Secondary | ICD-10-CM | POA: Diagnosis not present

## 2018-07-23 DIAGNOSIS — J189 Pneumonia, unspecified organism: Secondary | ICD-10-CM | POA: Diagnosis not present

## 2018-07-23 DIAGNOSIS — G4734 Idiopathic sleep related nonobstructive alveolar hypoventilation: Secondary | ICD-10-CM | POA: Diagnosis not present

## 2018-07-23 DIAGNOSIS — I6930 Unspecified sequelae of cerebral infarction: Secondary | ICD-10-CM | POA: Diagnosis not present

## 2018-07-23 DIAGNOSIS — F329 Major depressive disorder, single episode, unspecified: Secondary | ICD-10-CM | POA: Diagnosis not present

## 2018-07-23 DIAGNOSIS — I69359 Hemiplegia and hemiparesis following cerebral infarction affecting unspecified side: Secondary | ICD-10-CM | POA: Diagnosis not present

## 2018-07-24 DIAGNOSIS — J189 Pneumonia, unspecified organism: Secondary | ICD-10-CM | POA: Diagnosis not present

## 2018-07-24 DIAGNOSIS — R630 Anorexia: Secondary | ICD-10-CM | POA: Diagnosis not present

## 2018-07-24 DIAGNOSIS — I69359 Hemiplegia and hemiparesis following cerebral infarction affecting unspecified side: Secondary | ICD-10-CM | POA: Diagnosis not present

## 2018-07-24 DIAGNOSIS — G4734 Idiopathic sleep related nonobstructive alveolar hypoventilation: Secondary | ICD-10-CM | POA: Diagnosis not present

## 2018-07-24 DIAGNOSIS — I6930 Unspecified sequelae of cerebral infarction: Secondary | ICD-10-CM | POA: Diagnosis not present

## 2018-07-24 DIAGNOSIS — F329 Major depressive disorder, single episode, unspecified: Secondary | ICD-10-CM | POA: Diagnosis not present

## 2018-07-25 DIAGNOSIS — R1310 Dysphagia, unspecified: Secondary | ICD-10-CM | POA: Diagnosis not present

## 2018-07-25 DIAGNOSIS — F329 Major depressive disorder, single episode, unspecified: Secondary | ICD-10-CM | POA: Diagnosis not present

## 2018-07-25 DIAGNOSIS — R0902 Hypoxemia: Secondary | ICD-10-CM | POA: Diagnosis not present

## 2018-07-25 DIAGNOSIS — L89152 Pressure ulcer of sacral region, stage 2: Secondary | ICD-10-CM | POA: Diagnosis not present

## 2018-07-25 DIAGNOSIS — G4734 Idiopathic sleep related nonobstructive alveolar hypoventilation: Secondary | ICD-10-CM | POA: Diagnosis not present

## 2018-07-25 DIAGNOSIS — R531 Weakness: Secondary | ICD-10-CM | POA: Diagnosis not present

## 2018-07-25 DIAGNOSIS — Z9981 Dependence on supplemental oxygen: Secondary | ICD-10-CM | POA: Diagnosis not present

## 2018-07-25 DIAGNOSIS — Z9181 History of falling: Secondary | ICD-10-CM | POA: Diagnosis not present

## 2018-07-25 DIAGNOSIS — R1312 Dysphagia, oropharyngeal phase: Secondary | ICD-10-CM | POA: Diagnosis not present

## 2018-07-25 DIAGNOSIS — I10 Essential (primary) hypertension: Secondary | ICD-10-CM | POA: Diagnosis not present

## 2018-07-25 DIAGNOSIS — M62838 Other muscle spasm: Secondary | ICD-10-CM | POA: Diagnosis not present

## 2018-07-25 DIAGNOSIS — Z931 Gastrostomy status: Secondary | ICD-10-CM | POA: Diagnosis not present

## 2018-07-25 DIAGNOSIS — I69359 Hemiplegia and hemiparesis following cerebral infarction affecting unspecified side: Secondary | ICD-10-CM | POA: Diagnosis not present

## 2018-07-25 DIAGNOSIS — R630 Anorexia: Secondary | ICD-10-CM | POA: Diagnosis not present

## 2018-07-25 DIAGNOSIS — J189 Pneumonia, unspecified organism: Secondary | ICD-10-CM | POA: Diagnosis not present

## 2018-07-25 DIAGNOSIS — Z9049 Acquired absence of other specified parts of digestive tract: Secondary | ICD-10-CM | POA: Diagnosis not present

## 2018-07-25 DIAGNOSIS — G8100 Flaccid hemiplegia affecting unspecified side: Secondary | ICD-10-CM | POA: Diagnosis not present

## 2018-07-25 DIAGNOSIS — I6930 Unspecified sequelae of cerebral infarction: Secondary | ICD-10-CM | POA: Diagnosis not present

## 2018-07-26 DIAGNOSIS — J189 Pneumonia, unspecified organism: Secondary | ICD-10-CM | POA: Diagnosis not present

## 2018-07-26 DIAGNOSIS — R630 Anorexia: Secondary | ICD-10-CM | POA: Diagnosis not present

## 2018-07-26 DIAGNOSIS — I6930 Unspecified sequelae of cerebral infarction: Secondary | ICD-10-CM | POA: Diagnosis not present

## 2018-07-26 DIAGNOSIS — G4734 Idiopathic sleep related nonobstructive alveolar hypoventilation: Secondary | ICD-10-CM | POA: Diagnosis not present

## 2018-07-26 DIAGNOSIS — I69359 Hemiplegia and hemiparesis following cerebral infarction affecting unspecified side: Secondary | ICD-10-CM | POA: Diagnosis not present

## 2018-07-26 DIAGNOSIS — F329 Major depressive disorder, single episode, unspecified: Secondary | ICD-10-CM | POA: Diagnosis not present

## 2018-07-27 DIAGNOSIS — G4734 Idiopathic sleep related nonobstructive alveolar hypoventilation: Secondary | ICD-10-CM | POA: Diagnosis not present

## 2018-07-27 DIAGNOSIS — F329 Major depressive disorder, single episode, unspecified: Secondary | ICD-10-CM | POA: Diagnosis not present

## 2018-07-27 DIAGNOSIS — R630 Anorexia: Secondary | ICD-10-CM | POA: Diagnosis not present

## 2018-07-27 DIAGNOSIS — I6930 Unspecified sequelae of cerebral infarction: Secondary | ICD-10-CM | POA: Diagnosis not present

## 2018-07-27 DIAGNOSIS — I69359 Hemiplegia and hemiparesis following cerebral infarction affecting unspecified side: Secondary | ICD-10-CM | POA: Diagnosis not present

## 2018-07-27 DIAGNOSIS — J189 Pneumonia, unspecified organism: Secondary | ICD-10-CM | POA: Diagnosis not present

## 2018-07-30 DIAGNOSIS — R630 Anorexia: Secondary | ICD-10-CM | POA: Diagnosis not present

## 2018-07-30 DIAGNOSIS — G4734 Idiopathic sleep related nonobstructive alveolar hypoventilation: Secondary | ICD-10-CM | POA: Diagnosis not present

## 2018-07-30 DIAGNOSIS — F329 Major depressive disorder, single episode, unspecified: Secondary | ICD-10-CM | POA: Diagnosis not present

## 2018-07-30 DIAGNOSIS — J189 Pneumonia, unspecified organism: Secondary | ICD-10-CM | POA: Diagnosis not present

## 2018-07-30 DIAGNOSIS — I69359 Hemiplegia and hemiparesis following cerebral infarction affecting unspecified side: Secondary | ICD-10-CM | POA: Diagnosis not present

## 2018-07-30 DIAGNOSIS — I6930 Unspecified sequelae of cerebral infarction: Secondary | ICD-10-CM | POA: Diagnosis not present

## 2018-07-31 DIAGNOSIS — I69359 Hemiplegia and hemiparesis following cerebral infarction affecting unspecified side: Secondary | ICD-10-CM | POA: Diagnosis not present

## 2018-07-31 DIAGNOSIS — I6930 Unspecified sequelae of cerebral infarction: Secondary | ICD-10-CM | POA: Diagnosis not present

## 2018-07-31 DIAGNOSIS — F329 Major depressive disorder, single episode, unspecified: Secondary | ICD-10-CM | POA: Diagnosis not present

## 2018-07-31 DIAGNOSIS — J189 Pneumonia, unspecified organism: Secondary | ICD-10-CM | POA: Diagnosis not present

## 2018-07-31 DIAGNOSIS — G4734 Idiopathic sleep related nonobstructive alveolar hypoventilation: Secondary | ICD-10-CM | POA: Diagnosis not present

## 2018-07-31 DIAGNOSIS — R630 Anorexia: Secondary | ICD-10-CM | POA: Diagnosis not present

## 2018-08-01 DIAGNOSIS — J189 Pneumonia, unspecified organism: Secondary | ICD-10-CM | POA: Diagnosis not present

## 2018-08-01 DIAGNOSIS — G4734 Idiopathic sleep related nonobstructive alveolar hypoventilation: Secondary | ICD-10-CM | POA: Diagnosis not present

## 2018-08-01 DIAGNOSIS — F329 Major depressive disorder, single episode, unspecified: Secondary | ICD-10-CM | POA: Diagnosis not present

## 2018-08-01 DIAGNOSIS — I6930 Unspecified sequelae of cerebral infarction: Secondary | ICD-10-CM | POA: Diagnosis not present

## 2018-08-01 DIAGNOSIS — I69359 Hemiplegia and hemiparesis following cerebral infarction affecting unspecified side: Secondary | ICD-10-CM | POA: Diagnosis not present

## 2018-08-01 DIAGNOSIS — R630 Anorexia: Secondary | ICD-10-CM | POA: Diagnosis not present

## 2018-08-02 DIAGNOSIS — R630 Anorexia: Secondary | ICD-10-CM | POA: Diagnosis not present

## 2018-08-02 DIAGNOSIS — I6930 Unspecified sequelae of cerebral infarction: Secondary | ICD-10-CM | POA: Diagnosis not present

## 2018-08-02 DIAGNOSIS — I69359 Hemiplegia and hemiparesis following cerebral infarction affecting unspecified side: Secondary | ICD-10-CM | POA: Diagnosis not present

## 2018-08-02 DIAGNOSIS — G4734 Idiopathic sleep related nonobstructive alveolar hypoventilation: Secondary | ICD-10-CM | POA: Diagnosis not present

## 2018-08-02 DIAGNOSIS — J189 Pneumonia, unspecified organism: Secondary | ICD-10-CM | POA: Diagnosis not present

## 2018-08-02 DIAGNOSIS — F329 Major depressive disorder, single episode, unspecified: Secondary | ICD-10-CM | POA: Diagnosis not present

## 2018-08-03 DIAGNOSIS — R630 Anorexia: Secondary | ICD-10-CM | POA: Diagnosis not present

## 2018-08-03 DIAGNOSIS — G4734 Idiopathic sleep related nonobstructive alveolar hypoventilation: Secondary | ICD-10-CM | POA: Diagnosis not present

## 2018-08-03 DIAGNOSIS — I6930 Unspecified sequelae of cerebral infarction: Secondary | ICD-10-CM | POA: Diagnosis not present

## 2018-08-03 DIAGNOSIS — I69359 Hemiplegia and hemiparesis following cerebral infarction affecting unspecified side: Secondary | ICD-10-CM | POA: Diagnosis not present

## 2018-08-03 DIAGNOSIS — F329 Major depressive disorder, single episode, unspecified: Secondary | ICD-10-CM | POA: Diagnosis not present

## 2018-08-03 DIAGNOSIS — J189 Pneumonia, unspecified organism: Secondary | ICD-10-CM | POA: Diagnosis not present

## 2018-08-06 DIAGNOSIS — I6930 Unspecified sequelae of cerebral infarction: Secondary | ICD-10-CM | POA: Diagnosis not present

## 2018-08-06 DIAGNOSIS — J189 Pneumonia, unspecified organism: Secondary | ICD-10-CM | POA: Diagnosis not present

## 2018-08-06 DIAGNOSIS — F329 Major depressive disorder, single episode, unspecified: Secondary | ICD-10-CM | POA: Diagnosis not present

## 2018-08-06 DIAGNOSIS — R630 Anorexia: Secondary | ICD-10-CM | POA: Diagnosis not present

## 2018-08-06 DIAGNOSIS — I69359 Hemiplegia and hemiparesis following cerebral infarction affecting unspecified side: Secondary | ICD-10-CM | POA: Diagnosis not present

## 2018-08-06 DIAGNOSIS — G4734 Idiopathic sleep related nonobstructive alveolar hypoventilation: Secondary | ICD-10-CM | POA: Diagnosis not present

## 2018-08-07 DIAGNOSIS — G4734 Idiopathic sleep related nonobstructive alveolar hypoventilation: Secondary | ICD-10-CM | POA: Diagnosis not present

## 2018-08-07 DIAGNOSIS — R630 Anorexia: Secondary | ICD-10-CM | POA: Diagnosis not present

## 2018-08-07 DIAGNOSIS — I6930 Unspecified sequelae of cerebral infarction: Secondary | ICD-10-CM | POA: Diagnosis not present

## 2018-08-07 DIAGNOSIS — J189 Pneumonia, unspecified organism: Secondary | ICD-10-CM | POA: Diagnosis not present

## 2018-08-07 DIAGNOSIS — F329 Major depressive disorder, single episode, unspecified: Secondary | ICD-10-CM | POA: Diagnosis not present

## 2018-08-07 DIAGNOSIS — I69359 Hemiplegia and hemiparesis following cerebral infarction affecting unspecified side: Secondary | ICD-10-CM | POA: Diagnosis not present

## 2018-08-08 DIAGNOSIS — R630 Anorexia: Secondary | ICD-10-CM | POA: Diagnosis not present

## 2018-08-08 DIAGNOSIS — I69359 Hemiplegia and hemiparesis following cerebral infarction affecting unspecified side: Secondary | ICD-10-CM | POA: Diagnosis not present

## 2018-08-08 DIAGNOSIS — J189 Pneumonia, unspecified organism: Secondary | ICD-10-CM | POA: Diagnosis not present

## 2018-08-08 DIAGNOSIS — I6930 Unspecified sequelae of cerebral infarction: Secondary | ICD-10-CM | POA: Diagnosis not present

## 2018-08-08 DIAGNOSIS — G4734 Idiopathic sleep related nonobstructive alveolar hypoventilation: Secondary | ICD-10-CM | POA: Diagnosis not present

## 2018-08-08 DIAGNOSIS — F329 Major depressive disorder, single episode, unspecified: Secondary | ICD-10-CM | POA: Diagnosis not present

## 2018-08-09 DIAGNOSIS — G4734 Idiopathic sleep related nonobstructive alveolar hypoventilation: Secondary | ICD-10-CM | POA: Diagnosis not present

## 2018-08-09 DIAGNOSIS — J189 Pneumonia, unspecified organism: Secondary | ICD-10-CM | POA: Diagnosis not present

## 2018-08-09 DIAGNOSIS — F329 Major depressive disorder, single episode, unspecified: Secondary | ICD-10-CM | POA: Diagnosis not present

## 2018-08-09 DIAGNOSIS — I69359 Hemiplegia and hemiparesis following cerebral infarction affecting unspecified side: Secondary | ICD-10-CM | POA: Diagnosis not present

## 2018-08-09 DIAGNOSIS — I6930 Unspecified sequelae of cerebral infarction: Secondary | ICD-10-CM | POA: Diagnosis not present

## 2018-08-09 DIAGNOSIS — R630 Anorexia: Secondary | ICD-10-CM | POA: Diagnosis not present

## 2018-08-10 DIAGNOSIS — R630 Anorexia: Secondary | ICD-10-CM | POA: Diagnosis not present

## 2018-08-10 DIAGNOSIS — F329 Major depressive disorder, single episode, unspecified: Secondary | ICD-10-CM | POA: Diagnosis not present

## 2018-08-10 DIAGNOSIS — I69359 Hemiplegia and hemiparesis following cerebral infarction affecting unspecified side: Secondary | ICD-10-CM | POA: Diagnosis not present

## 2018-08-10 DIAGNOSIS — I6930 Unspecified sequelae of cerebral infarction: Secondary | ICD-10-CM | POA: Diagnosis not present

## 2018-08-10 DIAGNOSIS — J189 Pneumonia, unspecified organism: Secondary | ICD-10-CM | POA: Diagnosis not present

## 2018-08-10 DIAGNOSIS — G4734 Idiopathic sleep related nonobstructive alveolar hypoventilation: Secondary | ICD-10-CM | POA: Diagnosis not present

## 2018-08-13 DIAGNOSIS — J189 Pneumonia, unspecified organism: Secondary | ICD-10-CM | POA: Diagnosis not present

## 2018-08-13 DIAGNOSIS — F329 Major depressive disorder, single episode, unspecified: Secondary | ICD-10-CM | POA: Diagnosis not present

## 2018-08-13 DIAGNOSIS — G4734 Idiopathic sleep related nonobstructive alveolar hypoventilation: Secondary | ICD-10-CM | POA: Diagnosis not present

## 2018-08-13 DIAGNOSIS — I69359 Hemiplegia and hemiparesis following cerebral infarction affecting unspecified side: Secondary | ICD-10-CM | POA: Diagnosis not present

## 2018-08-13 DIAGNOSIS — I6930 Unspecified sequelae of cerebral infarction: Secondary | ICD-10-CM | POA: Diagnosis not present

## 2018-08-13 DIAGNOSIS — R630 Anorexia: Secondary | ICD-10-CM | POA: Diagnosis not present

## 2018-08-14 DIAGNOSIS — I69359 Hemiplegia and hemiparesis following cerebral infarction affecting unspecified side: Secondary | ICD-10-CM | POA: Diagnosis not present

## 2018-08-14 DIAGNOSIS — J189 Pneumonia, unspecified organism: Secondary | ICD-10-CM | POA: Diagnosis not present

## 2018-08-14 DIAGNOSIS — I6930 Unspecified sequelae of cerebral infarction: Secondary | ICD-10-CM | POA: Diagnosis not present

## 2018-08-14 DIAGNOSIS — R630 Anorexia: Secondary | ICD-10-CM | POA: Diagnosis not present

## 2018-08-14 DIAGNOSIS — F329 Major depressive disorder, single episode, unspecified: Secondary | ICD-10-CM | POA: Diagnosis not present

## 2018-08-14 DIAGNOSIS — G4734 Idiopathic sleep related nonobstructive alveolar hypoventilation: Secondary | ICD-10-CM | POA: Diagnosis not present

## 2018-08-15 DIAGNOSIS — R630 Anorexia: Secondary | ICD-10-CM | POA: Diagnosis not present

## 2018-08-15 DIAGNOSIS — G4734 Idiopathic sleep related nonobstructive alveolar hypoventilation: Secondary | ICD-10-CM | POA: Diagnosis not present

## 2018-08-15 DIAGNOSIS — J189 Pneumonia, unspecified organism: Secondary | ICD-10-CM | POA: Diagnosis not present

## 2018-08-15 DIAGNOSIS — F329 Major depressive disorder, single episode, unspecified: Secondary | ICD-10-CM | POA: Diagnosis not present

## 2018-08-15 DIAGNOSIS — I69359 Hemiplegia and hemiparesis following cerebral infarction affecting unspecified side: Secondary | ICD-10-CM | POA: Diagnosis not present

## 2018-08-15 DIAGNOSIS — I6930 Unspecified sequelae of cerebral infarction: Secondary | ICD-10-CM | POA: Diagnosis not present

## 2018-08-16 DIAGNOSIS — F329 Major depressive disorder, single episode, unspecified: Secondary | ICD-10-CM | POA: Diagnosis not present

## 2018-08-16 DIAGNOSIS — R630 Anorexia: Secondary | ICD-10-CM | POA: Diagnosis not present

## 2018-08-16 DIAGNOSIS — I69359 Hemiplegia and hemiparesis following cerebral infarction affecting unspecified side: Secondary | ICD-10-CM | POA: Diagnosis not present

## 2018-08-16 DIAGNOSIS — I6930 Unspecified sequelae of cerebral infarction: Secondary | ICD-10-CM | POA: Diagnosis not present

## 2018-08-16 DIAGNOSIS — G4734 Idiopathic sleep related nonobstructive alveolar hypoventilation: Secondary | ICD-10-CM | POA: Diagnosis not present

## 2018-08-16 DIAGNOSIS — J189 Pneumonia, unspecified organism: Secondary | ICD-10-CM | POA: Diagnosis not present

## 2018-08-17 DIAGNOSIS — J189 Pneumonia, unspecified organism: Secondary | ICD-10-CM | POA: Diagnosis not present

## 2018-08-17 DIAGNOSIS — F329 Major depressive disorder, single episode, unspecified: Secondary | ICD-10-CM | POA: Diagnosis not present

## 2018-08-17 DIAGNOSIS — R630 Anorexia: Secondary | ICD-10-CM | POA: Diagnosis not present

## 2018-08-17 DIAGNOSIS — I6930 Unspecified sequelae of cerebral infarction: Secondary | ICD-10-CM | POA: Diagnosis not present

## 2018-08-17 DIAGNOSIS — I69359 Hemiplegia and hemiparesis following cerebral infarction affecting unspecified side: Secondary | ICD-10-CM | POA: Diagnosis not present

## 2018-08-17 DIAGNOSIS — G4734 Idiopathic sleep related nonobstructive alveolar hypoventilation: Secondary | ICD-10-CM | POA: Diagnosis not present

## 2018-08-20 DIAGNOSIS — J189 Pneumonia, unspecified organism: Secondary | ICD-10-CM | POA: Diagnosis not present

## 2018-08-20 DIAGNOSIS — R630 Anorexia: Secondary | ICD-10-CM | POA: Diagnosis not present

## 2018-08-20 DIAGNOSIS — F329 Major depressive disorder, single episode, unspecified: Secondary | ICD-10-CM | POA: Diagnosis not present

## 2018-08-20 DIAGNOSIS — I6930 Unspecified sequelae of cerebral infarction: Secondary | ICD-10-CM | POA: Diagnosis not present

## 2018-08-20 DIAGNOSIS — G4734 Idiopathic sleep related nonobstructive alveolar hypoventilation: Secondary | ICD-10-CM | POA: Diagnosis not present

## 2018-08-20 DIAGNOSIS — I69359 Hemiplegia and hemiparesis following cerebral infarction affecting unspecified side: Secondary | ICD-10-CM | POA: Diagnosis not present

## 2018-08-21 DIAGNOSIS — G4734 Idiopathic sleep related nonobstructive alveolar hypoventilation: Secondary | ICD-10-CM | POA: Diagnosis not present

## 2018-08-21 DIAGNOSIS — J189 Pneumonia, unspecified organism: Secondary | ICD-10-CM | POA: Diagnosis not present

## 2018-08-21 DIAGNOSIS — R630 Anorexia: Secondary | ICD-10-CM | POA: Diagnosis not present

## 2018-08-21 DIAGNOSIS — I6930 Unspecified sequelae of cerebral infarction: Secondary | ICD-10-CM | POA: Diagnosis not present

## 2018-08-21 DIAGNOSIS — F329 Major depressive disorder, single episode, unspecified: Secondary | ICD-10-CM | POA: Diagnosis not present

## 2018-08-21 DIAGNOSIS — I69359 Hemiplegia and hemiparesis following cerebral infarction affecting unspecified side: Secondary | ICD-10-CM | POA: Diagnosis not present

## 2018-08-22 DIAGNOSIS — F329 Major depressive disorder, single episode, unspecified: Secondary | ICD-10-CM | POA: Diagnosis not present

## 2018-08-22 DIAGNOSIS — R630 Anorexia: Secondary | ICD-10-CM | POA: Diagnosis not present

## 2018-08-22 DIAGNOSIS — G4734 Idiopathic sleep related nonobstructive alveolar hypoventilation: Secondary | ICD-10-CM | POA: Diagnosis not present

## 2018-08-22 DIAGNOSIS — I6930 Unspecified sequelae of cerebral infarction: Secondary | ICD-10-CM | POA: Diagnosis not present

## 2018-08-22 DIAGNOSIS — I69359 Hemiplegia and hemiparesis following cerebral infarction affecting unspecified side: Secondary | ICD-10-CM | POA: Diagnosis not present

## 2018-08-22 DIAGNOSIS — J189 Pneumonia, unspecified organism: Secondary | ICD-10-CM | POA: Diagnosis not present

## 2018-08-23 DIAGNOSIS — G4734 Idiopathic sleep related nonobstructive alveolar hypoventilation: Secondary | ICD-10-CM | POA: Diagnosis not present

## 2018-08-23 DIAGNOSIS — F329 Major depressive disorder, single episode, unspecified: Secondary | ICD-10-CM | POA: Diagnosis not present

## 2018-08-23 DIAGNOSIS — I6930 Unspecified sequelae of cerebral infarction: Secondary | ICD-10-CM | POA: Diagnosis not present

## 2018-08-23 DIAGNOSIS — R630 Anorexia: Secondary | ICD-10-CM | POA: Diagnosis not present

## 2018-08-23 DIAGNOSIS — I69359 Hemiplegia and hemiparesis following cerebral infarction affecting unspecified side: Secondary | ICD-10-CM | POA: Diagnosis not present

## 2018-08-23 DIAGNOSIS — J189 Pneumonia, unspecified organism: Secondary | ICD-10-CM | POA: Diagnosis not present

## 2018-08-24 DIAGNOSIS — L89152 Pressure ulcer of sacral region, stage 2: Secondary | ICD-10-CM | POA: Diagnosis not present

## 2018-08-24 DIAGNOSIS — Z9181 History of falling: Secondary | ICD-10-CM | POA: Diagnosis not present

## 2018-08-24 DIAGNOSIS — R531 Weakness: Secondary | ICD-10-CM | POA: Diagnosis not present

## 2018-08-24 DIAGNOSIS — R0902 Hypoxemia: Secondary | ICD-10-CM | POA: Diagnosis not present

## 2018-08-24 DIAGNOSIS — I10 Essential (primary) hypertension: Secondary | ICD-10-CM | POA: Diagnosis not present

## 2018-08-24 DIAGNOSIS — I69359 Hemiplegia and hemiparesis following cerebral infarction affecting unspecified side: Secondary | ICD-10-CM | POA: Diagnosis not present

## 2018-08-24 DIAGNOSIS — Z9981 Dependence on supplemental oxygen: Secondary | ICD-10-CM | POA: Diagnosis not present

## 2018-08-24 DIAGNOSIS — R1310 Dysphagia, unspecified: Secondary | ICD-10-CM | POA: Diagnosis not present

## 2018-08-24 DIAGNOSIS — I6930 Unspecified sequelae of cerebral infarction: Secondary | ICD-10-CM | POA: Diagnosis not present

## 2018-08-24 DIAGNOSIS — Z931 Gastrostomy status: Secondary | ICD-10-CM | POA: Diagnosis not present

## 2018-08-24 DIAGNOSIS — R630 Anorexia: Secondary | ICD-10-CM | POA: Diagnosis not present

## 2018-08-24 DIAGNOSIS — R1312 Dysphagia, oropharyngeal phase: Secondary | ICD-10-CM | POA: Diagnosis not present

## 2018-08-24 DIAGNOSIS — G4734 Idiopathic sleep related nonobstructive alveolar hypoventilation: Secondary | ICD-10-CM | POA: Diagnosis not present

## 2018-08-24 DIAGNOSIS — M62838 Other muscle spasm: Secondary | ICD-10-CM | POA: Diagnosis not present

## 2018-08-24 DIAGNOSIS — F329 Major depressive disorder, single episode, unspecified: Secondary | ICD-10-CM | POA: Diagnosis not present

## 2018-08-24 DIAGNOSIS — Z9049 Acquired absence of other specified parts of digestive tract: Secondary | ICD-10-CM | POA: Diagnosis not present

## 2018-08-24 DIAGNOSIS — J189 Pneumonia, unspecified organism: Secondary | ICD-10-CM | POA: Diagnosis not present

## 2018-08-24 DIAGNOSIS — G8100 Flaccid hemiplegia affecting unspecified side: Secondary | ICD-10-CM | POA: Diagnosis not present

## 2018-08-27 DIAGNOSIS — I69359 Hemiplegia and hemiparesis following cerebral infarction affecting unspecified side: Secondary | ICD-10-CM | POA: Diagnosis not present

## 2018-08-27 DIAGNOSIS — R630 Anorexia: Secondary | ICD-10-CM | POA: Diagnosis not present

## 2018-08-27 DIAGNOSIS — G4734 Idiopathic sleep related nonobstructive alveolar hypoventilation: Secondary | ICD-10-CM | POA: Diagnosis not present

## 2018-08-27 DIAGNOSIS — F329 Major depressive disorder, single episode, unspecified: Secondary | ICD-10-CM | POA: Diagnosis not present

## 2018-08-27 DIAGNOSIS — I6930 Unspecified sequelae of cerebral infarction: Secondary | ICD-10-CM | POA: Diagnosis not present

## 2018-08-27 DIAGNOSIS — J189 Pneumonia, unspecified organism: Secondary | ICD-10-CM | POA: Diagnosis not present

## 2018-08-28 DIAGNOSIS — J189 Pneumonia, unspecified organism: Secondary | ICD-10-CM | POA: Diagnosis not present

## 2018-08-28 DIAGNOSIS — G4734 Idiopathic sleep related nonobstructive alveolar hypoventilation: Secondary | ICD-10-CM | POA: Diagnosis not present

## 2018-08-28 DIAGNOSIS — I69359 Hemiplegia and hemiparesis following cerebral infarction affecting unspecified side: Secondary | ICD-10-CM | POA: Diagnosis not present

## 2018-08-28 DIAGNOSIS — I6930 Unspecified sequelae of cerebral infarction: Secondary | ICD-10-CM | POA: Diagnosis not present

## 2018-08-28 DIAGNOSIS — F329 Major depressive disorder, single episode, unspecified: Secondary | ICD-10-CM | POA: Diagnosis not present

## 2018-08-28 DIAGNOSIS — R630 Anorexia: Secondary | ICD-10-CM | POA: Diagnosis not present

## 2018-08-29 DIAGNOSIS — J189 Pneumonia, unspecified organism: Secondary | ICD-10-CM | POA: Diagnosis not present

## 2018-08-29 DIAGNOSIS — G4734 Idiopathic sleep related nonobstructive alveolar hypoventilation: Secondary | ICD-10-CM | POA: Diagnosis not present

## 2018-08-29 DIAGNOSIS — F329 Major depressive disorder, single episode, unspecified: Secondary | ICD-10-CM | POA: Diagnosis not present

## 2018-08-29 DIAGNOSIS — R630 Anorexia: Secondary | ICD-10-CM | POA: Diagnosis not present

## 2018-08-29 DIAGNOSIS — I69359 Hemiplegia and hemiparesis following cerebral infarction affecting unspecified side: Secondary | ICD-10-CM | POA: Diagnosis not present

## 2018-08-29 DIAGNOSIS — I6930 Unspecified sequelae of cerebral infarction: Secondary | ICD-10-CM | POA: Diagnosis not present

## 2018-08-30 DIAGNOSIS — R630 Anorexia: Secondary | ICD-10-CM | POA: Diagnosis not present

## 2018-08-30 DIAGNOSIS — J189 Pneumonia, unspecified organism: Secondary | ICD-10-CM | POA: Diagnosis not present

## 2018-08-30 DIAGNOSIS — F329 Major depressive disorder, single episode, unspecified: Secondary | ICD-10-CM | POA: Diagnosis not present

## 2018-08-30 DIAGNOSIS — G4734 Idiopathic sleep related nonobstructive alveolar hypoventilation: Secondary | ICD-10-CM | POA: Diagnosis not present

## 2018-08-30 DIAGNOSIS — I69359 Hemiplegia and hemiparesis following cerebral infarction affecting unspecified side: Secondary | ICD-10-CM | POA: Diagnosis not present

## 2018-08-30 DIAGNOSIS — I6930 Unspecified sequelae of cerebral infarction: Secondary | ICD-10-CM | POA: Diagnosis not present

## 2018-08-31 DIAGNOSIS — J189 Pneumonia, unspecified organism: Secondary | ICD-10-CM | POA: Diagnosis not present

## 2018-08-31 DIAGNOSIS — G4734 Idiopathic sleep related nonobstructive alveolar hypoventilation: Secondary | ICD-10-CM | POA: Diagnosis not present

## 2018-08-31 DIAGNOSIS — F329 Major depressive disorder, single episode, unspecified: Secondary | ICD-10-CM | POA: Diagnosis not present

## 2018-08-31 DIAGNOSIS — I6930 Unspecified sequelae of cerebral infarction: Secondary | ICD-10-CM | POA: Diagnosis not present

## 2018-08-31 DIAGNOSIS — R630 Anorexia: Secondary | ICD-10-CM | POA: Diagnosis not present

## 2018-08-31 DIAGNOSIS — I69359 Hemiplegia and hemiparesis following cerebral infarction affecting unspecified side: Secondary | ICD-10-CM | POA: Diagnosis not present

## 2018-09-01 DIAGNOSIS — G4734 Idiopathic sleep related nonobstructive alveolar hypoventilation: Secondary | ICD-10-CM | POA: Diagnosis not present

## 2018-09-01 DIAGNOSIS — J189 Pneumonia, unspecified organism: Secondary | ICD-10-CM | POA: Diagnosis not present

## 2018-09-01 DIAGNOSIS — F329 Major depressive disorder, single episode, unspecified: Secondary | ICD-10-CM | POA: Diagnosis not present

## 2018-09-01 DIAGNOSIS — R630 Anorexia: Secondary | ICD-10-CM | POA: Diagnosis not present

## 2018-09-01 DIAGNOSIS — I6930 Unspecified sequelae of cerebral infarction: Secondary | ICD-10-CM | POA: Diagnosis not present

## 2018-09-01 DIAGNOSIS — I69359 Hemiplegia and hemiparesis following cerebral infarction affecting unspecified side: Secondary | ICD-10-CM | POA: Diagnosis not present

## 2018-09-24 DEATH — deceased
